# Patient Record
Sex: Male | Born: 1950 | Race: White | Hispanic: No | Marital: Single | Smoking: Current every day smoker
Health system: Southern US, Community
[De-identification: ages and names within clinical notes are randomized; demographics above are authoritative.]

## PROBLEM LIST (undated history)

## (undated) DIAGNOSIS — E785 Hyperlipidemia, unspecified: Secondary | ICD-10-CM

## (undated) DIAGNOSIS — S51802A Unspecified open wound of left forearm, initial encounter: Secondary | ICD-10-CM

## (undated) DIAGNOSIS — R519 Headache, unspecified: Secondary | ICD-10-CM

## (undated) DIAGNOSIS — I25709 Atherosclerosis of coronary artery bypass graft(s), unspecified, with unspecified angina pectoris: Secondary | ICD-10-CM

## (undated) DIAGNOSIS — I219 Acute myocardial infarction, unspecified: Secondary | ICD-10-CM

## (undated) DIAGNOSIS — Z972 Presence of dental prosthetic device (complete) (partial): Secondary | ICD-10-CM

## (undated) DIAGNOSIS — E1169 Type 2 diabetes mellitus with other specified complication: Secondary | ICD-10-CM

## (undated) DIAGNOSIS — I10 Essential (primary) hypertension: Secondary | ICD-10-CM

## (undated) DIAGNOSIS — Z720 Tobacco use: Secondary | ICD-10-CM

## (undated) DIAGNOSIS — E118 Type 2 diabetes mellitus with unspecified complications: Secondary | ICD-10-CM

## (undated) DIAGNOSIS — I2510 Atherosclerotic heart disease of native coronary artery without angina pectoris: Secondary | ICD-10-CM

## (undated) DIAGNOSIS — I639 Cerebral infarction, unspecified: Secondary | ICD-10-CM

## (undated) DIAGNOSIS — Z951 Presence of aortocoronary bypass graft: Secondary | ICD-10-CM

## (undated) HISTORY — PX: CORONARY ARTERY BYPASS GRAFT: SHX141

## (undated) HISTORY — PX: KNEE SURGERY: SHX244

## (undated) HISTORY — PX: TONSILLECTOMY: SUR1361

## (undated) HISTORY — PX: MUSCLE REPAIR: SHX867

## (undated) HISTORY — PX: MULTIPLE TOOTH EXTRACTIONS: SHX2053

---

## 2010-01-26 ENCOUNTER — Ambulatory Visit: Payer: Self-pay | Admitting: Thoracic Surgery (Cardiothoracic Vascular Surgery)

## 2010-02-03 ENCOUNTER — Ambulatory Visit: Payer: Self-pay | Admitting: Thoracic Surgery (Cardiothoracic Vascular Surgery)

## 2010-03-18 ENCOUNTER — Ambulatory Visit: Payer: Self-pay | Admitting: Cardiothoracic Surgery

## 2011-04-06 NOTE — Assessment & Plan Note (Signed)
[facility]   Matthew Moses, Matthew Moses  DOB:  1951-02-27                                        March 18, 2010  CHART #:  [id number]   Matthew Moses returns to the office today for postop followup visit  following coronary artery bypass grafting x4 with a sequential left  internal mammary to the mid and distal LAD and sequential saphenous vein  graft to the obtuse first and second obtuse marginal vessels done by Dr.  Dorris Fetch on March 15.  The patient has done well since discharge.  He  has a long history, including morbid obesity, tobacco abuse, poorly  controlled diabetes, hypertension, hyperlipidemia, gout, rheumatoid  arthritis.  Since discharge, he has been walking up to a mile several  times a day.  He has had no recurrent angina.  He had been smoking up to  2 packs of cigarettes a day prior to surgery.  He notes that since he  was discharged in March, he has smoked 4 cigarettes.   He was discharged home on:  1. Aspirin 81 mg a day.  2. Metformin 500 mg twice daily.  3. Zocor 40 mg a day.  4. Hydromorphone 2 mg as needed.  5. Lisinopril 5 mg twice a day.  6. Lorazepam 1 mg q.8 h. p.r.n.  7. Lopressor 25 p.o. b.i.d.  8. Amiodarone 400 p.o. t.i.d. for 7 days and then 400 twice a day for      1 week then 200 mg twice a day.   __________  The patient has called the office twice requesting sleeping  medication and further pain medication.  He was given Ultram although he  said he used some with relief, but it did make him nauseated.  He was  given a prescription for Ambien but said that did not work. Overall, as  he gets further away from surgery, he notes that his sleeping problems  have much improved and his pain no longer requires narcotics.   On exam, his lungs are clear bilaterally.  His sternum is stable and  healing well.  It has a small amount of eschar at the chest tube site,  but without infection.  The incision at his right knee is well  healed.  The left leg where the vein was harvested, after a failed attempt at  having a suitable vein in the right leg, is well healed.  He has no  peripheral edema.   Overall, the overall patient is making reasonable progress.  I have  encouraged him to enroll in the cardiac rehabilitation program.  He  lives near the hospital and can walk to the program.  Again, we have  discussed in detail the need to stop smoking and he has continued to  work on this.  He notes that previous attempts with medication,  including Chantix, caused side effects and he was not able to tolerated  it.  Overall, his wound is healing well.  I have not made him a return  appointment in the surgical office, but would be glad to see him at his  request or cardiology request at anytime.   Sheliah Plane, MD  Electronically Signed   EG/MEDQ  D:  03/18/2010  T:  03/18/2010  Job:  [id number]   cc:   Dr. Tereso Newcomer

## 2015-07-25 DIAGNOSIS — Z951 Presence of aortocoronary bypass graft: Secondary | ICD-10-CM | POA: Diagnosis present

## 2015-07-25 DIAGNOSIS — Z7982 Long term (current) use of aspirin: Secondary | ICD-10-CM | POA: Insufficient documentation

## 2015-07-25 DIAGNOSIS — I480 Paroxysmal atrial fibrillation: Secondary | ICD-10-CM | POA: Insufficient documentation

## 2015-07-25 DIAGNOSIS — I10 Essential (primary) hypertension: Secondary | ICD-10-CM | POA: Diagnosis present

## 2015-07-25 DIAGNOSIS — I2510 Atherosclerotic heart disease of native coronary artery without angina pectoris: Secondary | ICD-10-CM | POA: Insufficient documentation

## 2015-07-25 DIAGNOSIS — E7800 Pure hypercholesterolemia, unspecified: Secondary | ICD-10-CM | POA: Diagnosis present

## 2015-12-13 ENCOUNTER — Emergency Department (HOSPITAL_BASED_OUTPATIENT_CLINIC_OR_DEPARTMENT_OTHER)
Admission: EM | Admit: 2015-12-13 | Discharge: 2015-12-13 | Disposition: A | Discharge status: Home / Self Care | Payer: Medicaid Other | Attending: Emergency Medicine | Admitting: Emergency Medicine

## 2015-12-13 ENCOUNTER — Encounter (HOSPITAL_BASED_OUTPATIENT_CLINIC_OR_DEPARTMENT_OTHER): Payer: Self-pay | Admitting: *Deleted

## 2015-12-13 ENCOUNTER — Emergency Department (HOSPITAL_BASED_OUTPATIENT_CLINIC_OR_DEPARTMENT_OTHER): Payer: Medicaid Other

## 2015-12-13 DIAGNOSIS — Z79899 Other long term (current) drug therapy: Secondary | ICD-10-CM | POA: Diagnosis not present

## 2015-12-13 DIAGNOSIS — Y998 Other external cause status: Secondary | ICD-10-CM | POA: Insufficient documentation

## 2015-12-13 DIAGNOSIS — Z880 Allergy status to penicillin: Secondary | ICD-10-CM | POA: Diagnosis not present

## 2015-12-13 DIAGNOSIS — W540XXA Bitten by dog, initial encounter: Secondary | ICD-10-CM | POA: Insufficient documentation

## 2015-12-13 DIAGNOSIS — S61031A Puncture wound without foreign body of right thumb without damage to nail, initial encounter: Secondary | ICD-10-CM | POA: Insufficient documentation

## 2015-12-13 DIAGNOSIS — Y9389 Activity, other specified: Secondary | ICD-10-CM | POA: Insufficient documentation

## 2015-12-13 DIAGNOSIS — Z23 Encounter for immunization: Secondary | ICD-10-CM | POA: Insufficient documentation

## 2015-12-13 DIAGNOSIS — I10 Essential (primary) hypertension: Secondary | ICD-10-CM | POA: Diagnosis not present

## 2015-12-13 DIAGNOSIS — F17200 Nicotine dependence, unspecified, uncomplicated: Secondary | ICD-10-CM | POA: Insufficient documentation

## 2015-12-13 DIAGNOSIS — Z951 Presence of aortocoronary bypass graft: Secondary | ICD-10-CM | POA: Insufficient documentation

## 2015-12-13 DIAGNOSIS — S61051A Open bite of right thumb without damage to nail, initial encounter: Secondary | ICD-10-CM

## 2015-12-13 DIAGNOSIS — Y9289 Other specified places as the place of occurrence of the external cause: Secondary | ICD-10-CM | POA: Diagnosis not present

## 2015-12-13 DIAGNOSIS — I2510 Atherosclerotic heart disease of native coronary artery without angina pectoris: Secondary | ICD-10-CM | POA: Diagnosis not present

## 2015-12-13 HISTORY — DX: Presence of aortocoronary bypass graft: Z95.1

## 2015-12-13 HISTORY — DX: Essential (primary) hypertension: I10

## 2015-12-13 MED ORDER — HYDROCODONE-ACETAMINOPHEN 5-325 MG PO TABS
1.0000 | ORAL_TABLET | Freq: Once | ORAL | Status: AC
  Administered 2015-12-13: 1 via ORAL
  Filled 2015-12-13: qty 1

## 2015-12-13 MED ORDER — HYDROCODONE-ACETAMINOPHEN 5-325 MG PO TABS: 1.0000 | ORAL_TABLET | Freq: Four times a day (QID) | ORAL | Status: DC | PRN

## 2015-12-13 MED ORDER — CIPROFLOXACIN HCL 500 MG PO TABS
500.0000 mg | ORAL_TABLET | Freq: Once | ORAL | Status: AC
  Administered 2015-12-13: 500 mg via ORAL
  Filled 2015-12-13: qty 1

## 2015-12-13 MED ORDER — LIDOCAINE HCL (PF) 1 % IJ SOLN
10.0000 mL | Freq: Once | INTRAMUSCULAR | Status: AC
  Administered 2015-12-13: 10 mL via INTRADERMAL
  Filled 2015-12-13: qty 10

## 2015-12-13 MED ORDER — CIPROFLOXACIN HCL 500 MG PO TABS: 500.0000 mg | ORAL_TABLET | Freq: Two times a day (BID) | ORAL | Status: DC

## 2015-12-13 MED ORDER — CLINDAMYCIN HCL 150 MG PO CAPS: 450.0000 mg | ORAL_CAPSULE | Freq: Three times a day (TID) | ORAL | Status: DC

## 2015-12-13 MED ORDER — TETANUS-DIPHTH-ACELL PERTUSSIS 5-2.5-18.5 LF-MCG/0.5 IM SUSP: 0.5000 mL | Freq: Once | INTRAMUSCULAR | Status: DC

## 2015-12-13 MED ORDER — CLINDAMYCIN HCL 150 MG PO CAPS
450.0000 mg | ORAL_CAPSULE | Freq: Once | ORAL | Status: AC
  Administered 2015-12-13: 450 mg via ORAL
  Filled 2015-12-13: qty 3

## 2015-12-13 NOTE — ED Notes (Signed)
Pt d/c home w/3 Rx, pt ambulatory on d/c, driven home by spouse, pt a/o x4

## 2015-12-13 NOTE — ED Notes (Signed)
Pt reports dog bite to right thumb by friend's dog this morning. One puncture mark noted. No bleeding. Dog is UTD on rabies vaccine

## 2015-12-13 NOTE — ED Notes (Signed)
Pt refusing Tdap, states he had "a tetanus shot 3.5 years ago." EDPA Dahlia Client made aware

## 2015-12-13 NOTE — Discharge Instructions (Signed)
1. Medications: Vicodin, Cipro, Clinda, usual home medications 2. Treatment: rest, drink plenty of fluids, keep wound clean with warm soap and water, keep bandages dry 3. Follow Up: Please followup with your primary doctor in 2-3 days for discussion of your diagnoses and further evaluation after today's visit; if you do not have a primary care doctor use the resource guide provided to find one; Please return to the ER for her sitting symptoms, signs of worsening infection    Animal Bite Animal bites can range from mild to serious. An animal bite can result in a scratch on the skin, a deep open cut, a puncture of the skin, a crush injury, or tearing away of the skin or a body part. A small bite from a house pet will usually not cause serious problems. However, some animal bites can become infected or injure a bone or other tissue.  Bites from certain animals can be more dangerous because of the risk of spreading rabies, which is a serious viral infection. This risk is higher with bites from stray animals or wild animals, such as raccoons, foxes, skunks, and bats. Dogs are responsible for most animal bites. Children are bitten more often than adults. SYMPTOMS  Common symptoms of an animal bite include:   Pain.   Bleeding.   Swelling.   Bruising.  DIAGNOSIS  This condition may be diagnosed based on a physical exam and medical history. Your health care provider will examine the wound and ask for details about the animal and how the bite happened. You may also have tests, such as:   Blood tests to check for infection or to determine if surgery is needed.  X-rays to check for damage to bones or joints.  Culture test. This uses a sample of fluid from the wound to check for infection. TREATMENT  Treatment varies depending on the location and type of animal bite and your medical history. Treatment may include:   Wound care. This often includes cleaning the wound, flushing the wound with  saline solution, and applying a bandage (dressing). Sometimes, the wound is left open to heal because of the high risk of infection. However, in some cases, the wound may be closed with stitches (sutures), staples, skin glue, or adhesive strips.   Antibiotic medicine.   Tetanus shot.   Rabies treatment if the animal could have rabies.  In some cases, bites that have become infected may require IV antibiotics and surgical treatment in the hospital.  HOME CARE INSTRUCTIONS Wound Care  Follow instructions from your health care provider about how to take care of your wound. Make sure you:  Wash your hands with soap and water before you change your dressing. If soap and water are not available, use hand sanitizer.  Change your dressing as told by your health care provider.  Leave sutures, skin glue, or adhesive strips in place. These skin closures may need to be in place for 2 weeks or longer. If adhesive strip edges start to loosen and curl up, you may trim the loose edges. Do not remove adhesive strips completely unless your health care provider tells you to do that.  Check your wound every day for signs of infection. Watch for:   Increasing redness, swelling, or pain.   Fluid, blood, or pus.  General Instructions  Take or apply over-the-counter and prescription medicines only as told by your health care provider.   If you were prescribed an antibiotic, take or apply it as told by your health  care provider. Do not stop using the antibiotic even if your condition improves.   Keep the injured area raised (elevated) above the level of your heart while you are sitting or lying down, if this is possible.   If directed, apply ice to the injured area.   Put ice in a plastic bag.   Place a towel between your skin and the bag.   Leave the ice on for 20 minutes, 2-3 times per day.   Keep all follow-up visits as told by your health care provider. This is important.   SEEK MEDICAL CARE IF:  You have increasing redness, swelling, or pain at the site of your wound.   You have a general feeling of sickness (malaise).   You feel nauseous or you vomit.   You have pain that does not get better.  SEEK IMMEDIATE MEDICAL CARE IF:  You have a red streak extending away from your wound.   You have fluid, blood, or pus coming from your wound.   You have a fever or chills.   You have trouble moving your injured area.   You have numbness or tingling extending beyond the wound.   This information is not intended to replace advice given to you by your health care provider. Make sure you discuss any questions you have with your health care provider.   Document Released: 07/27/2011 Document Revised: 07/30/2015 Document Reviewed: 03/26/2015 Elsevier Interactive Patient Education [employer]

## 2015-12-13 NOTE — ED Provider Notes (Signed)
CSN: [id number]     Arrival date & time 12/13/15  1723 History   First MD Initiated Contact with Patient 12/13/15 1846     Chief Complaint  Patient presents with  . Animal Bite     (Consider location/radiation/quality/duration/timing/severity/associated sxs/prior Treatment) The history is provided by the patient and medical records. No language interpreter was used.     Matthew Moses is a 65 y.o. male  with a hx of HTN, CABG presents to the Emergency Department complaining of acute, persistent wound to the left thumb after he was bitten by his dog at 9 AM. Patient has had pain and increasing swelling and redness of the thumb since that time. He has not had any treatment prior to arrival. He rates his pain at a 10/10. He owns the dog and the dog is up-to-date on all of his vaccines.  He denies fever, chills, nausea, vomiting.  Unknown last tetanus shot.   Past Medical History  Diagnosis Date  . Hx of CABG   . Coronary artery disease   . Hypertension    Past Surgical History  Procedure Laterality Date  . Coronary artery bypass graft    . Knee surgery    . Tonsillectomy    . Muscle repair     No family history on file. Social History  Substance Use Topics  . Smoking status: Current Every Day Smoker  . Smokeless tobacco: Never Used  . Alcohol Use: No    Review of Systems  Constitutional: Negative for fever.  Gastrointestinal: Negative for nausea and vomiting.  Skin: Positive for wound.  Allergic/Immunologic: Negative for immunocompromised state.  Neurological: Negative for weakness and numbness.  Hematological: Does not bruise/bleed easily.  Psychiatric/Behavioral: The patient is not nervous/anxious.       Allergies  Penicillins  Home Medications   Prior to Admission medications   Medication Sig Start Date End Date Taking? Authorizing Provider  metFORMIN (GLUCOPHAGE) 500 MG tablet Take by mouth 2 (two) times daily with a meal.   Yes Historical Provider, MD   ciprofloxacin (CIPRO) 500 MG tablet Take 1 tablet (500 mg total) by mouth 2 (two) times daily. One po bid x 7 days 12/13/15   Dahlia Client Biance Moncrief, PA-C  clindamycin (CLEOCIN) 150 MG capsule Take 3 capsules (450 mg total) by mouth 3 (three) times daily. 12/13/15   Tashari Schoenfelder, PA-C  HYDROcodone-acetaminophen (NORCO/VICODIN) 5-325 MG tablet Take 1 tablet by mouth every 6 (six) hours as needed for moderate pain or severe pain. 12/13/15   Rayola Everhart, PA-C   BP 129/91 mmHg  Pulse 67  Temp(Src) 98.3 F (36.8 C) (Oral)  Resp 16  Ht 6\' 1"  (1.854 m)  Wt 108.863 kg  BMI 31.67 kg/m2  SpO2 98% Physical Exam  Constitutional: He is oriented to person, place, and time. He appears well-developed and well-nourished. No distress.  HENT:  Head: Normocephalic and atraumatic.  Eyes: Conjunctivae are normal. No scleral icterus.  Neck: Normal range of motion.  Cardiovascular: Normal rate, regular rhythm, normal heart sounds and intact distal pulses.   No murmur heard. Capillary refill < 3 sec  Pulmonary/Chest: Effort normal and breath sounds normal. No respiratory distress.  Musculoskeletal: Normal range of motion. He exhibits no edema.  ROM: Full range of motion of the right thumb with obvious puncture wound to the medial distal third; hemostasis achieved  Neurological: He is alert and oriented to person, place, and time.  Sensation: Intact to dull and sharp to the right thumb Strength: 5/5 with  flexion and extension in the right thumb  Skin: Skin is warm and dry. He is not diaphoretic.  Psychiatric: He has a normal mood and affect.  Nursing note and vitals reviewed.   ED Course  Irrigation and debridement Date/Time: 12/13/2015 7:49 PM Performed by: Dierdre Forth Authorized by: Dierdre Forth Consent: Verbal consent obtained. Risks and benefits: risks, benefits and alternatives were discussed Consent given by: patient Patient understanding: patient states understanding  of the procedure being performed Patient consent: the patient's understanding of the procedure matches consent given Procedure consent: procedure consent matches procedure scheduled Relevant documents: relevant documents present and verified Site marked: the operative site was marked Required items: required blood products, implants, devices, and special equipment available Patient identity confirmed: arm band and verbally with patient Time out: Immediately prior to procedure a "time out" was called to verify the correct patient, procedure, equipment, support staff and site/side marked as required. Preparation: Patient was prepped and draped in the usual sterile fashion. Local anesthesia used: yes Anesthesia: digital block Local anesthetic: lidocaine 1% without epinephrine Anesthetic total: 6 ml Patient sedated: no Patient tolerance: Patient tolerated the procedure well with no immediate complications   (including critical care time)  Imaging Review Dg Finger Thumb Right  12/13/2015  CLINICAL DATA:  Dog bite to right thumb this morning, with right thumb pain and swelling. Initial encounter. EXAM: RIGHT THUMB 2+V COMPARISON:  None. FINDINGS: The puncture wound is not well characterized, though there is suggestion of mild underlying soft tissue air. No radiopaque foreign body is seen. There is no evidence of osseous disruption. Visualized joint spaces are grossly preserved. IMPRESSION: No radiopaque foreign bodies seen. No evidence of osseous disruption. Electronically Signed   By: Roanna Raider M.D.   On: 12/13/2015 18:28   I have personally reviewed and evaluated these images and lab results as part of my medical decision-making.   MDM   Final diagnoses:  Dog bite of thumb, right, initial encounter   Matthew Moses presents with laceration from a dog bite.  Pt wounds irrigated well with 18ga angiocath with sterile saline.  Wounds examined with visualization of the base and no foreign  bodies seen.  Pt Alert and oriented, NAD, nontoxic, nonseptic appearing.  Capillary refill intact and pt without neurologic deficit.  x-rays with no obvious foreign body.  The dog is known and up to date on his shots, no need for rabies vaccine. Pain treated in the emergency department. Wounds not closed secondary to concern for infection. We'll discharge home with pain medication, Cipro, Clinda and requests for close follow-up with PCP or back in the ER.   Patient adamantly refusing his tetanus shot.  He states he's had one less than 5 years ago however initially he told me he did not know when his last update was. Risk and benefit discussed. Tetanus shot not given.    Dahlia Client Safina Huard, PA-C 12/13/15 2004  Blane Ohara, MD 12/14/15 [phone]

## 2016-04-29 ENCOUNTER — Emergency Department (HOSPITAL_BASED_OUTPATIENT_CLINIC_OR_DEPARTMENT_OTHER)
Admission: EM | Admit: 2016-04-29 | Discharge: 2016-04-29 | Disposition: A | Discharge status: Home / Self Care | Payer: Medicaid Other | Attending: Emergency Medicine | Admitting: Emergency Medicine

## 2016-04-29 ENCOUNTER — Emergency Department (HOSPITAL_BASED_OUTPATIENT_CLINIC_OR_DEPARTMENT_OTHER): Payer: Medicaid Other

## 2016-04-29 ENCOUNTER — Encounter (HOSPITAL_BASED_OUTPATIENT_CLINIC_OR_DEPARTMENT_OTHER): Payer: Self-pay

## 2016-04-29 DIAGNOSIS — F17200 Nicotine dependence, unspecified, uncomplicated: Secondary | ICD-10-CM | POA: Insufficient documentation

## 2016-04-29 DIAGNOSIS — Y929 Unspecified place or not applicable: Secondary | ICD-10-CM | POA: Diagnosis not present

## 2016-04-29 DIAGNOSIS — I2510 Atherosclerotic heart disease of native coronary artery without angina pectoris: Secondary | ICD-10-CM | POA: Diagnosis not present

## 2016-04-29 DIAGNOSIS — Y999 Unspecified external cause status: Secondary | ICD-10-CM | POA: Insufficient documentation

## 2016-04-29 DIAGNOSIS — Y9301 Activity, walking, marching and hiking: Secondary | ICD-10-CM | POA: Diagnosis not present

## 2016-04-29 DIAGNOSIS — I10 Essential (primary) hypertension: Secondary | ICD-10-CM | POA: Diagnosis not present

## 2016-04-29 DIAGNOSIS — S8002XA Contusion of left knee, initial encounter: Secondary | ICD-10-CM | POA: Insufficient documentation

## 2016-04-29 DIAGNOSIS — W1839XA Other fall on same level, initial encounter: Secondary | ICD-10-CM | POA: Diagnosis not present

## 2016-04-29 DIAGNOSIS — S8992XA Unspecified injury of left lower leg, initial encounter: Secondary | ICD-10-CM | POA: Diagnosis present

## 2016-04-29 MED ORDER — OXYCODONE-ACETAMINOPHEN 5-325 MG PO TABS
ORAL_TABLET | ORAL | Status: AC
  Filled 2016-04-29: qty 2

## 2016-04-29 MED ORDER — PROMETHAZINE HCL 25 MG PO TABS: 25.0000 mg | ORAL_TABLET | Freq: Four times a day (QID) | ORAL | Status: DC | PRN

## 2016-04-29 MED ORDER — OXYCODONE-ACETAMINOPHEN 5-325 MG PO TABS
2.0000 | ORAL_TABLET | Freq: Once | ORAL | Status: AC
  Administered 2016-04-29: 2 via ORAL

## 2016-04-29 MED ORDER — NAPROXEN 500 MG PO TABS: 500.0000 mg | ORAL_TABLET | Freq: Two times a day (BID) | ORAL | Status: DC

## 2016-04-29 NOTE — ED Notes (Signed)
MD at bedside. 

## 2016-04-29 NOTE — ED Notes (Signed)
Patient transported to X-ray 

## 2016-04-29 NOTE — ED Notes (Signed)
Pt states his left knee "buckled" and he fell today-walking with crutches-refused w/c and to sit in triage

## 2016-04-29 NOTE — ED Provider Notes (Signed)
CSN: [id number]     Arrival date & time 04/29/16  1949 History  By signing my name below, I, Matthew Moses, attest that this documentation has been prepared under the direction and in the presence of Rolland Porter, MD. Electronically Signed: [name], ED Scribe. 04/29/2016. 9:58 PM.   Chief Complaint  Patient presents with  . Knee Injury    The history is provided by the patient. No language interpreter was used.     HPI Comments: Matthew Moses is a 65 y.o. male who presents to the Emergency Department complaining of sudden onset, constant, moderate, left knee pain that radiates down his leg s/p mechanical fall that occurred a few hours PTA. Pt reports he was walking when he felt his knee "buckle" causing him to fall forward onto bilateral hands and left knee. Pt denies LOC, or head injury. He reports his pain is made worse with weight bearing and movement. Pt has hx of left knee orthoscopic surgery 30 years ago. No hx of gout. He denies any other injuries from fall.   Past Medical History  Diagnosis Date  . Hx of CABG   . Coronary artery disease   . Hypertension   . High cholesterol    Past Surgical History  Procedure Laterality Date  . Coronary artery bypass graft    . Knee surgery    . Tonsillectomy    . Muscle repair     No family history on file. Social History  Substance Use Topics  . Smoking status: Current Every Day Smoker  . Smokeless tobacco: Never Used  . Alcohol Use: No    Review of Systems  Constitutional: Negative for fever, chills, diaphoresis, appetite change and fatigue.  HENT: Negative for mouth sores, sore throat and trouble swallowing.   Eyes: Negative for visual disturbance.  Respiratory: Negative for cough, chest tightness, shortness of breath and wheezing.   Cardiovascular: Negative for chest pain.  Gastrointestinal: Negative for nausea, vomiting, abdominal pain, diarrhea and abdominal distention.  Endocrine: Negative for polydipsia, polyphagia and  polyuria.  Genitourinary: Negative for dysuria, frequency and hematuria.  Musculoskeletal: Positive for arthralgias ( left knee). Negative for gait problem.  Skin: Negative for color change, pallor and rash.  Neurological: Negative for dizziness, syncope, light-headedness and headaches.  Hematological: Does not bruise/bleed easily.  Psychiatric/Behavioral: Negative for behavioral problems and confusion.    Allergies  Penicillins  Home Medications   Prior to Admission medications   Medication Sig Start Date End Date Taking? Authorizing Provider  naproxen (NAPROSYN) 500 MG tablet Take 1 tablet (500 mg total) by mouth 2 (two) times daily. 04/29/16   Rolland Porter, MD  promethazine (PHENERGAN) 25 MG tablet Take 1 tablet (25 mg total) by mouth every 6 (six) hours as needed for nausea or vomiting. 04/29/16   Rolland Porter, MD   BP 150/87 mmHg  Pulse 101  Temp(Src) 98.6 F (37 C) (Oral)  Resp 20  Ht 6\' 1"  (1.854 m)  Wt 240 lb (108.863 kg)  BMI 31.67 kg/m2  SpO2 99% Physical Exam  Constitutional: He is oriented to person, place, and time. He appears well-developed and well-nourished. No distress.  HENT:  Head: Normocephalic.  Eyes: Conjunctivae are normal. Pupils are equal, round, and reactive to light. No scleral icterus.  Neck: Normal range of motion. Neck supple. No thyromegaly present.  Cardiovascular: Normal rate and regular rhythm.  Exam reveals no gallop and no friction rub.   No murmur heard. Pulmonary/Chest: Effort normal and breath sounds normal. No  respiratory distress. He has no wheezes. He has no rales.  Abdominal: Soft. Bowel sounds are normal. He exhibits no distension. There is no tenderness. There is no rebound.  Musculoskeletal: Normal range of motion.  Extensor mechanism intact  Neurological: He is alert and oriented to person, place, and time.  Skin: Skin is warm and dry. No rash noted.  Psychiatric: He has a normal mood and affect. His behavior is normal.  Nursing note  and vitals reviewed.   ED Course  Procedures (including critical care time) DIAGNOSTIC STUDIES: Oxygen Saturation is 99% on RA, normal by my interpretation.    COORDINATION OF CARE: 9:35 PM Discussed treatment plan with pt at bedside which includes Xray, Oxycodone and pt agreed to plan.   Imaging Review Dg Knee Complete 4 Views Left  04/29/2016  CLINICAL DATA:  Fall today with left knee pain, initial encounter EXAM: LEFT KNEE - COMPLETE 4+ VIEW COMPARISON:  None. FINDINGS: Mild degenerative changes are noted medially as well as within the patellofemoral space. No joint effusion is seen. No acute fracture or dislocation is noted. Mild osteopenia is seen. IMPRESSION: Degenerative change without acute abnormality. Electronically Signed   By: Alcide Clever M.D.   On: 04/29/2016 20:57   I have personally reviewed and evaluated these images as part of my medical decision-making.   MDM   Final diagnoses:  Knee contusion, left, initial encounter    I personally performed the services described in this documentation, which was scribed in my presence. The recorded information has been reviewed and is accurate.    Rolland Porter, MD 05/04/16 1235

## 2016-04-29 NOTE — Discharge Instructions (Signed)
Minimize your ambulation first 2-3 days. Ice and elevation. Slowly increase weightbearing as tolerated.  Contusion A contusion is a deep bruise. Contusions are the result of a blunt injury to tissues and muscle fibers under the skin. The injury causes bleeding under the skin. The skin overlying the contusion may turn blue, purple, or yellow. Minor injuries will give you a painless contusion, but more severe contusions may stay painful and swollen for a few weeks.  CAUSES  This condition is usually caused by a blow, trauma, or direct force to an area of the body. SYMPTOMS  Symptoms of this condition include:  Swelling of the injured area.  Pain and tenderness in the injured area.  Discoloration. The area may have redness and then turn blue, purple, or yellow. DIAGNOSIS  This condition is diagnosed based on a physical exam and medical history. An X-ray, CT scan, or MRI may be needed to determine if there are any associated injuries, such as broken bones (fractures). TREATMENT  Specific treatment for this condition depends on what area of the body was injured. In general, the best treatment for a contusion is resting, icing, applying pressure to (compression), and elevating the injured area. This is often called the RICE strategy. Over-the-counter anti-inflammatory medicines may also be recommended for pain control.  HOME CARE INSTRUCTIONS   Rest the injured area.  If directed, apply ice to the injured area:  Put ice in a plastic bag.  Place a towel between your skin and the bag.  Leave the ice on for 20 minutes, 2-3 times per day.  If directed, apply light compression to the injured area using an elastic bandage. Make sure the bandage is not wrapped too tightly. Remove and reapply the bandage as directed by your health care provider.  If possible, raise (elevate) the injured area above the level of your heart while you are sitting or lying down.  Take over-the-counter and  prescription medicines only as told by your health care provider. SEEK MEDICAL CARE IF:  Your symptoms do not improve after several days of treatment.  Your symptoms get worse.  You have difficulty moving the injured area. SEEK IMMEDIATE MEDICAL CARE IF:   You have severe pain.  You have numbness in a hand or foot.  Your hand or foot turns pale or cold.   This information is not intended to replace advice given to you by your health care provider. Make sure you discuss any questions you have with your health care provider.   Document Released: 08/18/2005 Document Revised: 07/30/2015 Document Reviewed: 03/26/2015 Elsevier Interactive Patient Education [employer]

## 2016-07-02 ENCOUNTER — Inpatient Hospital Stay (HOSPITAL_BASED_OUTPATIENT_CLINIC_OR_DEPARTMENT_OTHER)
Admission: EM | Admit: 2016-07-02 | Discharge: 2016-07-05 | DRG: 063 | Disposition: A | Discharge status: Home / Self Care | Payer: Medicaid Other | Attending: Neurology | Admitting: Neurology

## 2016-07-02 ENCOUNTER — Encounter (HOSPITAL_BASED_OUTPATIENT_CLINIC_OR_DEPARTMENT_OTHER): Payer: Self-pay | Admitting: Emergency Medicine

## 2016-07-02 ENCOUNTER — Emergency Department (HOSPITAL_BASED_OUTPATIENT_CLINIC_OR_DEPARTMENT_OTHER): Payer: Medicaid Other

## 2016-07-02 DIAGNOSIS — F17210 Nicotine dependence, cigarettes, uncomplicated: Secondary | ICD-10-CM | POA: Diagnosis present

## 2016-07-02 DIAGNOSIS — I63419 Cerebral infarction due to embolism of unspecified middle cerebral artery: Secondary | ICD-10-CM | POA: Diagnosis not present

## 2016-07-02 DIAGNOSIS — Z880 Allergy status to penicillin: Secondary | ICD-10-CM

## 2016-07-02 DIAGNOSIS — I10 Essential (primary) hypertension: Secondary | ICD-10-CM | POA: Diagnosis present

## 2016-07-02 DIAGNOSIS — I2510 Atherosclerotic heart disease of native coronary artery without angina pectoris: Secondary | ICD-10-CM | POA: Diagnosis present

## 2016-07-02 DIAGNOSIS — E7800 Pure hypercholesterolemia, unspecified: Secondary | ICD-10-CM | POA: Diagnosis present

## 2016-07-02 DIAGNOSIS — E669 Obesity, unspecified: Secondary | ICD-10-CM | POA: Diagnosis present

## 2016-07-02 DIAGNOSIS — Z951 Presence of aortocoronary bypass graft: Secondary | ICD-10-CM

## 2016-07-02 DIAGNOSIS — E1165 Type 2 diabetes mellitus with hyperglycemia: Secondary | ICD-10-CM | POA: Diagnosis present

## 2016-07-02 DIAGNOSIS — F1210 Cannabis abuse, uncomplicated: Secondary | ICD-10-CM | POA: Diagnosis present

## 2016-07-02 DIAGNOSIS — R471 Dysarthria and anarthria: Secondary | ICD-10-CM | POA: Diagnosis present

## 2016-07-02 DIAGNOSIS — E118 Type 2 diabetes mellitus with unspecified complications: Secondary | ICD-10-CM

## 2016-07-02 DIAGNOSIS — M069 Rheumatoid arthritis, unspecified: Secondary | ICD-10-CM | POA: Diagnosis present

## 2016-07-02 DIAGNOSIS — R29810 Facial weakness: Secondary | ICD-10-CM | POA: Diagnosis present

## 2016-07-02 DIAGNOSIS — Z6831 Body mass index (BMI) 31.0-31.9, adult: Secondary | ICD-10-CM

## 2016-07-02 DIAGNOSIS — Z720 Tobacco use: Secondary | ICD-10-CM | POA: Diagnosis not present

## 2016-07-02 DIAGNOSIS — R58 Hemorrhage, not elsewhere classified: Secondary | ICD-10-CM

## 2016-07-02 DIAGNOSIS — E785 Hyperlipidemia, unspecified: Secondary | ICD-10-CM | POA: Diagnosis present

## 2016-07-02 DIAGNOSIS — I639 Cerebral infarction, unspecified: Secondary | ICD-10-CM

## 2016-07-02 DIAGNOSIS — I6789 Other cerebrovascular disease: Secondary | ICD-10-CM | POA: Diagnosis not present

## 2016-07-02 DIAGNOSIS — I63312 Cerebral infarction due to thrombosis of left middle cerebral artery: Secondary | ICD-10-CM | POA: Diagnosis not present

## 2016-07-02 DIAGNOSIS — I6340 Cerebral infarction due to embolism of unspecified cerebral artery: Principal | ICD-10-CM | POA: Diagnosis present

## 2016-07-02 LAB — GLUCOSE, CAPILLARY: GLUCOSE-CAPILLARY: 286 mg/dL — AB (ref 65–99)

## 2016-07-02 LAB — COMPREHENSIVE METABOLIC PANEL
ALT: 14 U/L — AB (ref 17–63)
AST: 16 U/L (ref 15–41)
Albumin: 3.5 g/dL (ref 3.5–5.0)
Alkaline Phosphatase: 59 U/L (ref 38–126)
Anion gap: 8 (ref 5–15)
BUN: 11 mg/dL (ref 6–20)
CHLORIDE: 100 mmol/L — AB (ref 101–111)
CO2: 26 mmol/L (ref 22–32)
CREATININE: 0.76 mg/dL (ref 0.61–1.24)
Calcium: 8.5 mg/dL — ABNORMAL LOW (ref 8.9–10.3)
Glucose, Bld: 309 mg/dL — ABNORMAL HIGH (ref 65–99)
POTASSIUM: 3.7 mmol/L (ref 3.5–5.1)
Sodium: 134 mmol/L — ABNORMAL LOW (ref 135–145)
Total Bilirubin: 0.9 mg/dL (ref 0.3–1.2)
Total Protein: 6.7 g/dL (ref 6.5–8.1)

## 2016-07-02 LAB — CBC
HCT: 46.1 % (ref 39.0–52.0)
Hemoglobin: 16.3 g/dL (ref 13.0–17.0)
MCH: 30.6 pg (ref 26.0–34.0)
MCHC: 35.4 g/dL (ref 30.0–36.0)
MCV: 86.5 fL (ref 78.0–100.0)
PLATELETS: 196 10*3/uL (ref 150–400)
RBC: 5.33 MIL/uL (ref 4.22–5.81)
RDW: 12.9 % (ref 11.5–15.5)
WBC: 10.3 10*3/uL (ref 4.0–10.5)

## 2016-07-02 LAB — CBG MONITORING, ED: GLUCOSE-CAPILLARY: 315 mg/dL — AB (ref 65–99)

## 2016-07-02 LAB — URINE MICROSCOPIC-ADD ON
RBC / HPF: NONE SEEN RBC/hpf (ref 0–5)
SQUAMOUS EPITHELIAL / LPF: NONE SEEN

## 2016-07-02 LAB — URINALYSIS, ROUTINE W REFLEX MICROSCOPIC
Bilirubin Urine: NEGATIVE
HGB URINE DIPSTICK: NEGATIVE
KETONES UR: NEGATIVE mg/dL
Nitrite: NEGATIVE
PROTEIN: NEGATIVE mg/dL
Specific Gravity, Urine: 1.016 (ref 1.005–1.030)
pH: 7 (ref 5.0–8.0)

## 2016-07-02 LAB — DIFFERENTIAL
BASOS PCT: 1 %
Basophils Absolute: 0.1 10*3/uL (ref 0.0–0.1)
EOS ABS: 0.2 10*3/uL (ref 0.0–0.7)
EOS PCT: 2 %
Lymphocytes Relative: 33 %
Lymphs Abs: 3.4 10*3/uL (ref 0.7–4.0)
MONO ABS: 0.9 10*3/uL (ref 0.1–1.0)
Monocytes Relative: 9 %
Neutro Abs: 5.8 10*3/uL (ref 1.7–7.7)
Neutrophils Relative %: 55 %

## 2016-07-02 LAB — PROTIME-INR
INR: 0.99
PROTHROMBIN TIME: 13.1 s (ref 11.4–15.2)

## 2016-07-02 LAB — OCCULT BLOOD X 1 CARD TO LAB, STOOL: Fecal Occult Bld: NEGATIVE

## 2016-07-02 LAB — RAPID URINE DRUG SCREEN, HOSP PERFORMED
Amphetamines: NOT DETECTED
Barbiturates: NOT DETECTED
Benzodiazepines: NOT DETECTED
Cocaine: NOT DETECTED
OPIATES: NOT DETECTED
Tetrahydrocannabinol: POSITIVE — AB

## 2016-07-02 LAB — ETHANOL

## 2016-07-02 LAB — APTT: aPTT: 27 seconds (ref 24–36)

## 2016-07-02 LAB — MRSA PCR SCREENING: MRSA by PCR: NEGATIVE

## 2016-07-02 MED ORDER — ACETAMINOPHEN 325 MG PO TABS: 650.0000 mg | ORAL_TABLET | ORAL | Status: DC | PRN

## 2016-07-02 MED ORDER — SODIUM CHLORIDE 0.9 % IV SOLN
INTRAVENOUS | Status: DC
  Administered 2016-07-02 – 2016-07-03 (×3): via INTRAVENOUS

## 2016-07-02 MED ORDER — STROKE: EARLY STAGES OF RECOVERY BOOK
Freq: Once | Status: AC
  Administered 2016-07-02: 16:00:00
  Filled 2016-07-02: qty 1

## 2016-07-02 MED ORDER — ALTEPLASE (STROKE) FULL DOSE INFUSION
90.0000 mg | Freq: Once | INTRAVENOUS | Status: AC
  Administered 2016-07-02: 90 mg via INTRAVENOUS
  Filled 2016-07-02: qty 1
  Filled 2016-07-02: qty 100

## 2016-07-02 MED ORDER — ACETAMINOPHEN 650 MG RE SUPP: 650.0000 mg | RECTAL | Status: DC | PRN

## 2016-07-02 MED ORDER — INSULIN ASPART 100 UNIT/ML ~~LOC~~ SOLN: 0.0000 [IU] | SUBCUTANEOUS | Status: DC

## 2016-07-02 MED ORDER — SODIUM CHLORIDE 0.9 % IV SOLN: 50.0000 mL | Freq: Once | INTRAVENOUS | Status: DC

## 2016-07-02 MED ORDER — INSULIN ASPART 100 UNIT/ML ~~LOC~~ SOLN
0.0000 [IU] | Freq: Three times a day (TID) | SUBCUTANEOUS | Status: DC
  Administered 2016-07-03: 7 [IU] via SUBCUTANEOUS
  Administered 2016-07-03: 4 [IU] via SUBCUTANEOUS
  Administered 2016-07-03: 11 [IU] via SUBCUTANEOUS
  Administered 2016-07-04 (×2): 7 [IU] via SUBCUTANEOUS
  Administered 2016-07-04: 11 [IU] via SUBCUTANEOUS
  Administered 2016-07-05: 7 [IU] via SUBCUTANEOUS
  Administered 2016-07-05: 11 [IU] via SUBCUTANEOUS

## 2016-07-02 MED ORDER — FAMOTIDINE IN NACL 20-0.9 MG/50ML-% IV SOLN
20.0000 mg | Freq: Two times a day (BID) | INTRAVENOUS | Status: DC
  Administered 2016-07-03: 20 mg via INTRAVENOUS
  Filled 2016-07-02 (×2): qty 50

## 2016-07-02 MED ORDER — INSULIN ASPART 100 UNIT/ML ~~LOC~~ SOLN: 0.0000 [IU] | Freq: Every day | SUBCUTANEOUS | Status: DC

## 2016-07-02 MED ORDER — SENNOSIDES-DOCUSATE SODIUM 8.6-50 MG PO TABS: 1.0000 | ORAL_TABLET | Freq: Every evening | ORAL | Status: DC | PRN

## 2016-07-02 NOTE — ED Triage Notes (Signed)
Pt having right sided mouth droop, dragging his right leg and right arm flaccid.  Slurred speech.   Last known normal 1130 am.

## 2016-07-02 NOTE — ED Provider Notes (Signed)
Emergency Department Provider Note   I have reviewed the triage vital signs and the nursing notes.   HISTORY  Chief Complaint Code Stroke   HPI Matthew Moses is a 65 y.o. male with PMH of HTN, HLD, CAD, and DM presents to the emergency department for evaluation of sudden onset dysarthria and facial droop. The patient reports that he was last known well at 11:30 AM today. He believes that symptoms began at approximately 12:15 but cannot recall exactly when he was last well other than 11:30 AM. Denies prior history of stroke. Does not feel as if deficits are getting worse but not improving either. Patient with no chest pain or difficulty breathing. No head trauma. No headache. Patient denies any history of intracranial hemorrhage, cancer, intracranial mass, or history of GI bleeding.   Past Medical History:  Diagnosis Date  . Coronary artery disease   . High cholesterol   . Hx of CABG   . Hypertension     Patient Active Problem List   Diagnosis Date Noted  . CVA (cerebral infarction) 07/02/2016    Past Surgical History:  Procedure Laterality Date  . CORONARY ARTERY BYPASS GRAFT    . KNEE SURGERY    . MUSCLE REPAIR    . TONSILLECTOMY        Allergies Penicillins  No family history on file.  Social History Social History  Substance Use Topics  . Smoking status: Current Every Day Smoker  . Smokeless tobacco: Never Used  . Alcohol use No    Review of Systems  Constitutional: No fever/chills Eyes: No visual changes. ENT: No sore throat. Cardiovascular: Denies chest pain. Respiratory: Denies shortness of breath. Gastrointestinal: No abdominal pain.  No nausea, no vomiting.  No diarrhea.  No constipation. Genitourinary: Negative for dysuria. Musculoskeletal: Negative for back pain. Skin: Negative for rash. Neurological: Negative for headaches. Significant dysarthria and right face droop. Reports difficulty walking.   10-point ROS otherwise  negative.  ____________________________________________   PHYSICAL EXAM:  VITAL SIGNS: BP: 134/86 HR: 70 SpO2: 98% RR: 17   Constitutional: Alert and oriented. Clear dysarthria and right facial asymmetry.  Eyes: Conjunctivae are normal. PERRL. EOMI. Head: Atraumatic. Nose: No congestion/rhinnorhea. Mouth/Throat: Mucous membranes are moist.  Oropharynx non-erythematous. Neck: No stridor.  Cardiovascular: Normal rate, regular rhythm. Good peripheral circulation. Grossly normal heart sounds.   Respiratory: Normal respiratory effort.  No retractions. Lungs CTAB. Gastrointestinal: Soft and nontender. No distention.  Musculoskeletal: No lower extremity tenderness nor edema. No gross deformities of extremities. Neurologic: Dysarthria noted with partial right facial palsy. Positive RUE pronator drift. No clear sensory deficits or visual field deficits. No LE weakness or numbness.  Skin:  Skin is warm, dry and intact. No rash noted. Psychiatric: Mood and affect are normal. Speech and behavior are normal.  ____________________________________________   LABS (all labs ordered are listed, but only abnormal results are displayed)  Labs Reviewed  COMPREHENSIVE METABOLIC PANEL - Abnormal; Notable for the following:       Result Value   Sodium 134 (*)    Chloride 100 (*)    Glucose, Bld 309 (*)    Calcium 8.5 (*)    ALT 14 (*)    All other components within normal limits  CBG MONITORING, ED - Abnormal; Notable for the following:    Glucose-Capillary 315 (*)    All other components within normal limits  CBC  DIFFERENTIAL  OCCULT BLOOD X 1 CARD TO LAB, STOOL  ETHANOL  PROTIME-INR  APTT  URINE RAPID DRUG SCREEN, HOSP PERFORMED  URINALYSIS, ROUTINE W REFLEX MICROSCOPIC (NOT AT [facility])  POC OCCULT BLOOD, ED   ____________________________________________  EKG  Sinus rhythm. No STEMI. Reviewed in MUSE.  ____________________________________________  RADIOLOGY  Ct Head Code  Stroke W/o Cm  Result Date: 07/02/2016 CLINICAL DATA:  Code stroke. Right-sided weakness. Slurred speech. Last normal 11:30 a.m. EXAM: CT HEAD WITHOUT CONTRAST TECHNIQUE: Contiguous axial images were obtained from the base of the skull through the vertex without intravenous contrast. COMPARISON:  MRI head 11/11/2014 FINDINGS: Ventricle size normal.  Mild atrophy, appropriate for age. Negative for acute infarct. Negative for hemorrhage or mass. No edema or shift of the midline structures. 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 Negative calvarium. ASPECTS [facility] Stroke Program Early CT Score, [email or web address]) - Ganglionic level infarction (caudate, lentiform nuclei, internal capsule, insula, M1-M3 cortex): 7 - Supraganglionic infarction (M4-M6 cortex): 3 Total score (0-10 with 10 being normal): 10 IMPRESSION: 1. No acute intracranial abnormality. 2. ASPECTS score 10 These results were called by telephone at the time of interpretation on 07/02/2016 at 2:37 pm to Dr. Alona Bene , who verbally acknowledged these results. Electronically Signed   By: Marlan Palau M.D.   On: 07/02/2016 14:37    ____________________________________________   PROCEDURES  Procedure(s) performed:   Procedures  CRITICAL CARE Performed by: Maia Plan Total critical care time: 60 minutes Critical care time was exclusive of separately billable procedures and treating other patients. Critical care was necessary to treat or prevent imminent or life-threatening deterioration. Critical care was time spent personally by me on the following activities: development of treatment plan with patient and/or surrogate as well as nursing, discussions with consultants, evaluation of patient's response to treatment, examination of patient, obtaining history from patient or surrogate, ordering and performing treatments and interventions, ordering and review of laboratory studies, ordering and review of radiographic  studies, pulse oximetry and re-evaluation of patient's condition.  Alona Bene, MD Emergency Medicine  ____________________________________________   INITIAL IMPRESSION / ASSESSMENT AND PLAN / ED COURSE  Pertinent labs & imaging results that were available during my care of the patient were reviewed by me and considered in my medical decision making (see chart for details).  Patient presents to the emergency department for evaluation of strokelike symptoms. Patient outside of the 3 hour TPA window but within the 4.5 hour TPA window. Blood pressure is controlled without additional medication. Patient with no absolute contraindications to TPA. NIHSS 4 with above described deficits. Activated code stroke and will discuss with neurology regarding patient's candidacy for tPA.   2:15 PM Discussed case with Dr. Hilda Blades from Neurology. Reviewed the patient's physical exam and associated risk factors. The patient is beyond the 3 hour dose for TPA. He is within the 4.5 hour window but has relative contraindications including history of diabetes and relatively low NIH stroke scale. Will discuss the risk of bleeding with patient but Neurology recommendation is to consider not giving for mild symptoms and relative contraindications beyond the 3 hour window.   02:30 PM Discussed the risks and benefits of TPA in detail with the patient. He states he's been thinking about his onset of symptoms and believes that he was actually last known well at 12:05 PM as opposed to 11:30. I advised that given his diabetes he has some relative contraindications to TPA as I discussed with Dr. Hilda Blades. I presented the alternative of PT/OT/speech therapy with embolic workup. We also discussed that some strokes resolve spontaneously but it's impossible  to know if his will. Patient would like to start the TPA knowing that he has a risk of serious potentially fatal bleeding which we discussed in detail. Paged Neurology to update.    Discussed patient's case with Neurology, Dr. Hilda Blades.  Recommend admission to inpatient, ICU bed.  I will place holding orders per their request. Patient and family (if present) updated with plan. Care transferred to Neurology service.  I reviewed all nursing notes, vitals, pertinent old records, EKGs, labs, imaging (as available).  ____________________________________________  FINAL CLINICAL IMPRESSION(S) / ED DIAGNOSES  Final diagnoses:  Cerebral infarction due to unspecified mechanism     MEDICATIONS GIVEN DURING THIS VISIT:  Medications  alteplase (ACTIVASE) 1 mg/mL infusion 90 mg (90 mg Intravenous Transfusing/Transfer 07/02/16 1508)    Followed by  0.9 %  sodium chloride infusion (not administered)     NEW OUTPATIENT MEDICATIONS STARTED DURING THIS VISIT:  None   Note:  This document was prepared using Dragon voice recognition software and may include unintentional dictation errors.  Alona Bene, MD Emergency Medicine   Maia Plan, MD 07/02/16 1600

## 2016-07-02 NOTE — H&P (Signed)
Initial Neurological Consultation                      NEURO HOSPITALIST ADMISSION NOTE     Reason for Admission:  Dysarthria, right facial droop, right pronator drift, status post administration of TPA.   HPI:                                                                                                                                          Matthew Moses is a pleasant 65 year old gentleman with a past medical history of hypertension, hyperlipidemia, coronary artery disease, and diabetes. He presented to an outside emergency room with a complaint of dysarthria. He was noted to have a significant right facial droop as well as a right pronator drift. Matthew Moses has been somewhat inconsistent in his description of the onset of his symptoms. Initially he stated they were 11:30, however he then corrected himself to 12:15, at present he feels quite certain the onset was 1:30. He was evaluated by Dr. Ivin Booty long who felt that he was likely within the 3 hour TPA window and certainly within the 4.5 hour TPA window.  In a thorough discussion with Dr. Jacqulyn Bath, all risks factors for TPA were discussed in detail. The fact that the symptoms were relatively mild was considered a relative contraindication to TPA. However the symptoms are quite disturbing to Matthew Moses and on further discussion Matthew Moses requested that TPA be administered. The risk and benefits of TPA were discussed in detail. Matthew Moses was subsequently transferred here to [facility] ICU for further observation and care.  Matthew Moses reports that he does not like taking medication and has not actually been taking any medication for his various medical conditions. He subsequently blamed this on a problem with a rehabilitation facility that he is staying at. He reports he had fallen several years ago and injured his right arm and leg. He notes that since that time he has been institutionalized in the institution he states that will not provide him with his medications. We  will be contacting social services to see if they can assist him. [name] reports smoking on a daily basis. He reports no prior history of stroke.  Past Medical History:  Diagnosis Date  . Coronary artery disease   . High cholesterol   . Hx of CABG   . Hypertension     Past Surgical History:  Procedure Laterality Date  . CORONARY ARTERY BYPASS GRAFT    . KNEE SURGERY    . MUSCLE REPAIR    . TONSILLECTOMY      MEDICATIONS:  I have reviewed the patient's current medications.  Allergies  Allergen Reactions  . Penicillins Itching and Rash     Social History:  reports that he has been smoking.  He has never used smokeless tobacco. He reports that he uses drugs, including Marijuana. He reports that he does not drink alcohol.  No family history on file.   ROS:                                                                                                                                       History obtained from chart review  General ROS: negative for - chills, fatigue, fever, night sweats, weight gain or weight loss Psychological ROS: negative for - behavioral disorder, hallucinations, memory difficulties, mood swings or suicidal ideation Ophthalmic ROS: negative for - blurry vision, double vision, eye pain or loss of vision ENT ROS: negative for - epistaxis, nasal discharge, oral lesions, sore throat, tinnitus or vertigo Allergy and Immunology ROS: negative for - hives or itchy/watery eyes Hematological and Lymphatic ROS: negative for - bleeding problems, bruising or swollen lymph nodes Endocrine ROS: negative for - galactorrhea, hair pattern changes, polydipsia/polyuria or temperature intolerance Respiratory ROS: negative for - cough, hemoptysis, shortness of breath or wheezing Cardiovascular ROS: negative for - chest pain, dyspnea on exertion, edema or irregular  heartbeat Gastrointestinal ROS: negative for - abdominal pain, diarrhea, hematemesis, nausea/vomiting or stool incontinence Genito-Urinary ROS: negative for - dysuria, hematuria, incontinence or urinary frequency/urgency Musculoskeletal ROS: negative for - joint swelling or muscular weakness Neurological ROS: as noted in HPI Dermatological ROS: negative for rash and skin lesion changes   General Exam                                                                                                      Blood pressure 132/74, pulse 62, resp. rate 18, height 6\' 1"  (1.854 m), weight 109.6 kg (241 lb 10 oz), SpO2 98 %. HEENT-  Normocephalic, no lesions, without obvious abnormality.  Normal external eye and conjunctiva.  Normal TM's bilaterally.  Normal auditory canals and external ears. Normal external nose, mucus membranes and septum.  Normal pharynx. Cardiovascular- regular rate and rhythm, S1, S2 normal, no murmur, click, rub or gallop, pulses palpable throughout   Lungs- chest clear, no wheezing, rales, normal symmetric air entry, Heart exam - S1, S2 normal, no murmur, no gallop, rate regular Abdomen- soft, non-tender; bowel sounds normal; no masses,  no organomegaly Extremities- less then 2 second capillary refill Lymph-no  adenopathy palpable Musculoskeletal-no joint tenderness, deformity or swelling Skin-warm and dry, no hyperpigmentation, vitiligo, or suspicious lesions  Neurological Examination Mental Status: Matthew Moses is alert and oriented but dysarthric. He answers questions and follows commands appropriately. Cranial Nerves: Pupils are equal round reactive to light and accommodation. Extraocular movements are intact. There is a significant right facial droop. Tongue is midline. Motor: Subtle right pronator drift is noted Sensory: Decreased distally in the lower extremities to pinprick and light touch. Deep Tendon Reflexes: 1-2+ and symmetric throughout Plantars: Right:  downgoing   Left: downgoing Cerebellar: normal finger-to-nose, normal rapid alternating movements and normal heel-to-shin test    Lab Results: Basic Metabolic Panel:  Recent Labs Lab 07/02/16 1430  NA 134*  K 3.7  CL 100*  CO2 26  GLUCOSE 309*  BUN 11  CREATININE 0.76  CALCIUM 8.5*    Liver Function Tests:  Recent Labs Lab 07/02/16 1430  AST 16  ALT 14*  ALKPHOS 59  BILITOT 0.9  PROT 6.7  ALBUMIN 3.5   No results for input(s): LIPASE, AMYLASE in the last 168 hours. No results for input(s): AMMONIA in the last 168 hours.  CBC:  Recent Labs Lab 07/02/16 1405  WBC 10.3  NEUTROABS 5.8  HGB 16.3  HCT 46.1  MCV 86.5  PLT 196    Cardiac Enzymes: No results for input(s): CKTOTAL, CKMB, CKMBINDEX, TROPONINI in the last 168 hours.  Lipid Panel: No results for input(s): CHOL, TRIG, HDL, CHOLHDL, VLDL, LDLCALC in the last 168 hours.  CBG:  Recent Labs Lab 07/02/16 1437  GLUCAP 315*    Microbiology: No results found for this or any previous visit.  Coagulation Studies:  Recent Labs  07/02/16 1430  LABPROT 13.1  INR 0.99    Imaging: Ct Head Code Stroke W/o Cm  Result Date: 07/02/2016 CLINICAL DATA:  Code stroke. Right-sided weakness. Slurred speech. Last normal 11:30 a.m. EXAM: CT HEAD WITHOUT CONTRAST TECHNIQUE: Contiguous axial images were obtained from the base of the skull through the vertex without intravenous contrast. COMPARISON:  MRI head 11/11/2014 FINDINGS: Ventricle size normal.  Mild atrophy, appropriate for age. Negative for acute infarct. Negative for hemorrhage or mass. No edema or shift of the midline structures. 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 Negative calvarium. ASPECTS [facility] Stroke Program Early CT Score, [email or web address]) - Ganglionic level infarction (caudate, lentiform nuclei, internal capsule, insula, M1-M3 cortex): 7 - Supraganglionic infarction (M4-M6 cortex): 3 Total score (0-10 with 10 being  normal): 10 IMPRESSION: 1. No acute intracranial abnormality. 2. ASPECTS score 10 These results were called by telephone at the time of interpretation on 07/02/2016 at 2:37 pm to Dr. Alona Bene , who verbally acknowledged these results. Electronically Signed   By: Marlan Palau M.D.   On: 07/02/2016 14:37    Assessment/Plan:  Furious is a pleasant 65 year old gentleman who presents with dysarthria, right facial droop, and mild right pronator drift. His presentation is consistent with an ischemic event likely in the distribution of the left middle cerebral artery. He was within the time window for TPA and that has been administered. Given that his symptoms are mild, he would not be a good candidate for interventional radiology at this point.  Plan:  1. Admit to ICU  2. Standard ICU care  3. HgbA1c, fasting lipid panel  4. MRI, MRA  of the brain without contrast  5. PT consult, OT consult, Speech consult  6. Echocardiogram  7. Carotid dopplers  8. Prophylactic therapy  9. Risk factor modification  10. Telemetry monitoring  11. Frequent neuro checks  12. NPO until passes stroke swallow screen  13. please page stroke NP  Or  PA  Or MD from 8am -4 pm  as this patient from this time will be  followed by the stroke.   You can look them up on [email or web address]  Password [email or web address]      Tsering Leaman A. Hilda Blades, M.D. Neurohospitalist Phone: [phone]  07/02/2016, 4:34 PM

## 2016-07-02 NOTE — Progress Notes (Signed)
Pt requesting to sit on the side of bed. I told him that he is on bedrest for 24hrs due to the TPA medication and safety risks of falls and death. He requested an AMA form. I spoke with Dr Hilda Blades stating pts request to sit on side of bed and he said due to risk of injury he is not allowed and we would not be able to keep the pt here against his will and AMA paperwork could be given with pt knowledge of likely death if leaving. Pt given the risks per Dr Hilda Blades. He stated he still wanted to leave but has no clothes or ride so he will stay at this point. Will continue to monitor. Bed alarm on patient due to high risk of fall and wanting to leave AMA.

## 2016-07-02 NOTE — ED Notes (Signed)
Awaiting result from stool card to initiate alteplase.

## 2016-07-03 ENCOUNTER — Inpatient Hospital Stay (HOSPITAL_COMMUNITY): Payer: Medicaid Other

## 2016-07-03 LAB — GLUCOSE, CAPILLARY
GLUCOSE-CAPILLARY: 279 mg/dL — AB (ref 65–99)
GLUCOSE-CAPILLARY: 229 mg/dL — AB (ref 65–99)
GLUCOSE-CAPILLARY: 164 mg/dL — AB (ref 65–99)
GLUCOSE-CAPILLARY: 164 mg/dL — AB (ref 65–99)

## 2016-07-03 LAB — LIPID PANEL
Cholesterol: 159 mg/dL (ref 0–200)
HDL: 24 mg/dL — ABNORMAL LOW (ref >40)
LDL CALC: 98 mg/dL (ref 0–99)
Total CHOL/HDL Ratio: 6.6 RATIO
Triglycerides: 183 mg/dL — ABNORMAL HIGH (ref <150)
VLDL: 37 mg/dL (ref 0–40)

## 2016-07-03 MED ORDER — ASPIRIN 81 MG PO CHEW
81.0000 mg | CHEWABLE_TABLET | Freq: Every day | ORAL | Status: DC
  Administered 2016-07-03 – 2016-07-04 (×2): 81 mg via ORAL
  Filled 2016-07-03 (×2): qty 1

## 2016-07-03 MED ORDER — TRAMADOL HCL 50 MG PO TABS
50.0000 mg | ORAL_TABLET | Freq: Four times a day (QID) | ORAL | Status: DC | PRN
  Administered 2016-07-03 – 2016-07-04 (×3): 50 mg via ORAL
  Filled 2016-07-03 (×3): qty 1

## 2016-07-03 NOTE — Progress Notes (Signed)
PT Cancellation Note  Patient Details Name: Argelio Granier MRN: [id number] DOB: 07-Jun-1951   Cancelled Treatment:    Reason Eval/Treat Not Completed: Medical issues which prohibited therapy (Bedrest until 1607.  Will return tomorrow am. Thanks. )   Jonice Cerra F 07/03/2016, 11:42 AM  [facility] [phone] (pager)

## 2016-07-03 NOTE — Progress Notes (Signed)
OT Cancellation Note  Patient Details Name: Matthew Moses MRN: [id number] DOB: 09-30-1951   Cancelled Treatment:    Reason Eval/Treat Not Completed: Patient not medically ready (active bedrest orders). Will follow up for OT eval with updated activity orders.  Gaye Alken M.S., OTR/L Pager: [phone]  07/03/2016, 2:38 PM

## 2016-07-03 NOTE — Progress Notes (Signed)
STROKE TEAM PROGRESS NOTE   HISTORY OF PRESENT ILLNESS (per record) Matthew Moses is a pleasant 65 year old gentleman with a past medical history of hypertension, hyperlipidemia, coronary artery disease, and diabetes. He presented to an outside emergency room with a complaint of dysarthria. He was noted to have a significant right facial droop as well as a right pronator drift. Matthew Moses has been somewhat inconsistent in his description of the onset of his symptoms. Initially he stated they were 11:30, however he then corrected himself to 12:15, at present he feels quite certain the onset was 1:30. He was evaluated by Dr. Ivin Booty long who felt that he was likely within the 3 hour TPA window and certainly within the 4.5 hour TPA window.  In a thorough discussion with Dr. Jacqulyn Bath, all risks factors for TPA were discussed in detail. The fact that the symptoms were relatively mild was considered a relative contraindication to TPA. However the symptoms are quite disturbing to Matthew Moses and on further discussion Matthew Moses requested that TPA be administered. The risk and benefits of TPA were discussed in detail. Matthew Moses was subsequently transferred here to [facility] ICU for further observation and care.  Matthew Moses reports that he does not like taking medication and has not actually been taking any medication for his various medical conditions. He subsequently blamed this on a problem with a rehabilitation facility that he is staying at. He reports he had fallen several years ago and injured his right arm and leg. He notes that since that time he has been institutionalized in the institution he states that will not provide him with his medications. We will be contacting social services to see if they can assist him. Matthew Moses reports smoking on a daily basis. He reports no prior history of stroke.   SUBJECTIVE (INTERVAL HISTORY) Feels well.  No headaches.  Some slurred speech.  He is a smoker.  No cravings yet.    CT repeat at 24 hours,  MRI, MRA, CDUS, TTE pending.   OBJECTIVE Temp:  [97.4 F (36.3 C)-98.4 F (36.9 C)] 97.4 F (36.3 C) (08/12 0400) Pulse Rate:  [52-82] 54 (08/12 0700) Cardiac Rhythm: Sinus bradycardia (08/12 0000) Resp:  [0-34] 20 (08/12 0700) BP: (97-163)/(46-87) 103/52 (08/12 0700) SpO2:  [93 %-100 %] 95 % (08/12 0700) Weight:  [109.6 kg (241 lb 10 oz)] 109.6 kg (241 lb 10 oz) (08/11 1600)  CBC:  Recent Labs Lab 07/02/16 1405  WBC 10.3  NEUTROABS 5.8  HGB 16.3  HCT 46.1  MCV 86.5  PLT 196    Basic Metabolic Panel:  Recent Labs Lab 07/02/16 1430  NA 134*  K 3.7  CL 100*  CO2 26  GLUCOSE 309*  BUN 11  CREATININE 0.76  CALCIUM 8.5*    Lipid Panel:    Component Value Date/Time   CHOL 159 07/03/2016 0424   TRIG 183 (H) 07/03/2016 0424   HDL 24 (L) 07/03/2016 0424   CHOLHDL 6.6 07/03/2016 0424   VLDL 37 07/03/2016 0424   LDLCALC 98 07/03/2016 0424   HgbA1c: No results found for: HGBA1C Urine Drug Screen:    Component Value Date/Time   LABOPIA NONE DETECTED 07/02/2016 1646   COCAINSCRNUR NONE DETECTED 07/02/2016 1646   LABBENZ NONE DETECTED 07/02/2016 1646   AMPHETMU NONE DETECTED 07/02/2016 1646   THCU POSITIVE (A) 07/02/2016 1646   LABBARB NONE DETECTED 07/02/2016 1646      IMAGING  Ct Head Code Stroke W/o Cm 07/02/2016 1. No acute intracranial abnormality.  2. ASPECTS score 10  MRI / MRA Head / Brain - Pending        PHYSICAL EXAM  Awake, alert, fully oriented. Language-fluent.  Comprehension, naming, repetition- intact. Dysarthria.  Significant right lower facial droop.  EOMI. PERL. Strength 5/5 bilaterally.  No pronator drift. Coordination, sensory - intact Gait- deferred.    ASSESSMENT/PLAN Matthew Moses is a 65 y.o. male with history of hypertension, coronary artery bypass graft surgery, hyperlipidemia, and coronary artery disease presenting with right facial droop and right pronator drift.  He received IV TPA on Friday, 07/02/2016  at 1545.  Stroke:  Dominant infarct embolic secondary to an unknown source. MRI/MRA pending.  Resultant  - right facial droop  MRI  pending  MRA  pending  Carotid Doppler  pending  2D Echo  pending  LDL - 98  HgbA1c pending  VTE prophylaxis - SCDs  Diet heart healthy/carb modified Room service appropriate? Yes; Fluid consistency: Thin  No antithrombotic prior to admission, now on No antithrombotic - secondary to TPA therapy.  Patient counseled to be compliant with his antithrombotic medications  Ongoing aggressive stroke risk factor management  Therapy recommendations:  Pending  Disposition:  Pending  Hypertension  Stable - although mildly low blood pressures  Permissive hypertension (OK if < 220/120) but gradually normalize in 5-7 days  Long-term BP goal normotensive  Hyperlipidemia  Home meds: No lipid lowering medications prior to admission  LDL 98, goal < 70  Add Lipitor 20 mg daily  Continue statin at discharge    Other Stroke Risk Factors  Advanced age  Cigarette smoker - advised to stop smoking  Obesity, Body mass index is 31.88 kg/m., recommend weight loss, diet and exercise as appropriate   UDS positive for [facility]  Other Active Problems  Elevated glucose levels. Suspect diabetes mellitus - previously undiagnosed.  Hospital day # 1  Acute infarct, likely left hemisphere subcortical, s/p IV tPA.  The right arm weakness present on arrival is not there anymore.  BP is well controlled.  Smoking is major risk factor and willing to quit with Nicoderm patch so will start 21 mg/day when cravings hit.    Awaiting further imaging and diagnostic studies.  He was not on any antiplatelet agent when this happened, so will start ASA 81 mg qd in about 2 hours which would be 24 hours post IV t-PA.    Weston Settle, MD  To contact Stroke Continuity provider, please refer to [email or web address]. After hours, contact General Neurology

## 2016-07-04 ENCOUNTER — Inpatient Hospital Stay (HOSPITAL_COMMUNITY): Payer: Medicaid Other

## 2016-07-04 DIAGNOSIS — I63419 Cerebral infarction due to embolism of unspecified middle cerebral artery: Secondary | ICD-10-CM

## 2016-07-04 DIAGNOSIS — I6789 Other cerebrovascular disease: Secondary | ICD-10-CM

## 2016-07-04 LAB — GLUCOSE, CAPILLARY
GLUCOSE-CAPILLARY: 168 mg/dL — AB (ref 65–99)
Glucose-Capillary: 208 mg/dL — ABNORMAL HIGH (ref 65–99)
Glucose-Capillary: 232 mg/dL — ABNORMAL HIGH (ref 65–99)
Glucose-Capillary: 295 mg/dL — ABNORMAL HIGH (ref 65–99)

## 2016-07-04 LAB — ECHOCARDIOGRAM COMPLETE
HEIGHTINCHES: 73 in
WEIGHTICAEL: 3865.99 [oz_av]

## 2016-07-04 MED ORDER — ASPIRIN EC 325 MG PO TBEC
325.0000 mg | DELAYED_RELEASE_TABLET | Freq: Every day | ORAL | Status: DC
  Administered 2016-07-05: 325 mg via ORAL
  Filled 2016-07-04: qty 1

## 2016-07-04 MED ORDER — FAMOTIDINE 20 MG PO TABS
20.0000 mg | ORAL_TABLET | Freq: Two times a day (BID) | ORAL | Status: DC
  Administered 2016-07-04 – 2016-07-05 (×3): 20 mg via ORAL
  Filled 2016-07-04 (×3): qty 1

## 2016-07-04 MED ORDER — ATORVASTATIN CALCIUM 10 MG PO TABS: 20.0000 mg | ORAL_TABLET | Freq: Every day | ORAL | Status: DC

## 2016-07-04 NOTE — Evaluation (Addendum)
Physical Therapy Evaluation Patient Details Name: Matthew Moses MRN: [id number] DOB: 1950/12/11 Today's Date: 07/04/2016   History of Present Illness  Pt is a 65 y/o M who presented to outside ED with c/o dysarthria.  He was noted to have significant Rt facial droop as well as a Rt pronator drift.  TPA was administered.  CT head with no acute intracranial abnormality. MRA completed with acute infarct posterior limb internal capsule on the Lt.  Pt's PMH includes CAD, diabetes, HTN.     Clinical Impression  Pt admitted with above diagnosis. Matthew Moses presents with dysarthria and Rt facial droop, no other impairments identified.   He is independent with all aspects of mobility and tolerated high level balance activities while ambulating.  PT will sign off.    Follow Up Recommendations No PT follow up    Equipment Recommendations  None recommended by PT    Recommendations for Other Services Speech consult     Precautions / Restrictions Precautions Precautions: None Restrictions Weight Bearing Restrictions: No      Mobility  Bed Mobility Overal bed mobility: Independent                Transfers Overall transfer level: Independent Equipment used: None             General transfer comment: No cues or physical assist needed  Ambulation/Gait Ambulation/Gait assistance: Independent Ambulation Distance (Feet): 500 Feet Assistive device: None Gait Pattern/deviations: WFL(Within Functional Limits)   Gait velocity interpretation: at or above normal speed for age/gender General Gait Details: No instability.  Able to complete high level balance activites while ambulating without impairments.  Stairs            Wheelchair Mobility    Modified Rankin (Stroke Patients Only) Modified Rankin (Stroke Patients Only) Pre-Morbid Rankin Score: No symptoms Modified Rankin: No significant disability     Balance Overall balance assessment: Independent                                Standardized Balance Assessment Standardized Balance Assessment : Dynamic Gait Index   Dynamic Gait Index Level Surface: Normal Change in Gait Speed: Normal Gait with Horizontal Head Turns: Normal Gait with Vertical Head Turns: Normal Gait and Pivot Turn: Normal Step Over Obstacle: Normal Step Around Obstacles: Normal       Pertinent Vitals/Pain Pain Assessment: No/denies pain    Home Living Family/patient expects to be discharged to:: Private residence Living Arrangements: Spouse/significant other (girlfriend) Available Help at Discharge: Friend(s);Available PRN/intermittently (girlfriend works during the day as Tax inspector) Type of Home: Other(Comment) (condo) Home Access: Level entry     Home Layout: One level Home Equipment: None      Prior Function Level of Independence: Independent         Comments: Pt had just recently completed a landscaping project for his yard.  Is retired.     Hand Dominance   Dominant Hand: Right    Extremity/Trunk Assessment   Upper Extremity Assessment: Overall WFL for tasks assessed           Lower Extremity Assessment: Overall WFL for tasks assessed      Cervical / Trunk Assessment: Normal  Communication   Communication: Other (comment) (dysarthria)  Cognition Arousal/Alertness: Awake/alert Behavior During Therapy: WFL for tasks assessed/performed Overall Cognitive Status: Within Functional Limits for tasks assessed  General Comments General comments (skin integrity, edema, etc.): BP sitting at start of session 114/80, supine at end of session 138/76.  Reviewed signs and symptoms of a stroke with pt and pt verbalized understanding.    Exercises        Assessment/Plan    PT Assessment Patent does not need any further PT services  PT Diagnosis Hemiplegia dominant side   PT Problem List    PT Treatment Interventions     PT Goals (Current goals can be  found in the Care Plan section) Acute Rehab PT Goals Patient Stated Goal: to go home PT Goal Formulation: All assessment and education complete, DC therapy    Frequency     Barriers to discharge        Co-evaluation               End of Session Equipment Utilized During Treatment: Gait belt Activity Tolerance: Patient tolerated treatment well Patient left: in bed;with call bell/phone within reach Nurse Communication: Mobility status         Time: 0810-0824 PT Time Calculation (min) (ACUTE ONLY): 14 min   Charges:   PT Evaluation $PT Eval Low Complexity: 1 Procedure     PT G Codes:       Matthew Moses PT, DPT  Pager: [phone] Phone: [phone] 07/04/2016, 8:50 AM

## 2016-07-04 NOTE — Progress Notes (Signed)
Key Points: Use following P&T approved IV to PO antibiotic change policy.  Description contains the criteria that are approved Note: Policy Excludes:  Esophagectomy patientsPHARMACIST - PHYSICIAN COMMUNICATION DR:   Hilda Blades CONCERNING: IV to Oral Route Change Policy  RECOMMENDATION: This patient is receiving famotidine by the intravenous route.  Based on criteria approved by the Pharmacy and Therapeutics Committee, the intravenous medication(s) is/are being converted to the equivalent oral dose form(s).   DESCRIPTION: These criteria include:  The patient is eating (either orally or via tube) and/or has been taking other orally administered medications for a least 24 hours  The patient has no evidence of active gastrointestinal bleeding or impaired GI absorption (gastrectomy, short bowel, patient on TNA or NPO).  If you have questions about this conversion, please contact the Pharmacy Department  []   [phone] )  [phone] []   [phone] )  [facility] [x]   [phone] )  [facility] [facility] []   [phone] )  [facility] []   [phone] )  [facility]   ( [phone] [place], [facility] 07/04/2016 10:14 AM

## 2016-07-04 NOTE — Progress Notes (Signed)
Report called and given to Marylene Land, RN on 41M.

## 2016-07-04 NOTE — Progress Notes (Signed)
STROKE TEAM PROGRESS NOTE   HISTORY OF PRESENT ILLNESS (per record) Ogle is a pleasant 65 year old gentleman with a past medical history of hypertension, hyperlipidemia, coronary artery disease, and diabetes. He presented to an outside emergency room with a complaint of dysarthria. He was noted to have a significant right facial droop as well as a right pronator drift. Hikaru has been somewhat inconsistent in his description of the onset of his symptoms. Initially he stated they were 11:30, however he then corrected himself to 12:15, at present he feels quite certain the onset was 1:30. He was evaluated by Dr. Ivin Booty long who felt that he was likely within the 3 hour TPA window and certainly within the 4.5 hour TPA window.  In a thorough discussion with Dr. Jacqulyn Bath, all risks factors for TPA were discussed in detail. The fact that the symptoms were relatively mild was considered a relative contraindication to TPA. However the symptoms are quite disturbing to Dorinda Hill and on further discussion Zedrick requested that TPA be administered. The risk and benefits of TPA were discussed in detail. Adekunle was subsequently transferred here to [facility] ICU for further observation and care.  Dull reports that he does not like taking medication and has not actually been taking any medication for his various medical conditions. He subsequently blamed this on a problem with a rehabilitation facility that he is staying at. He reports he had fallen several years ago and injured his right arm and leg. He notes that since that time he has been institutionalized in the institution he states that will not provide him with his medications. We will be contacting social services to see if they can assist him. Ulrick reports smoking on a daily basis. He reports no prior history of stroke.   SUBJECTIVE (INTERVAL HISTORY) Feels well.  No headaches.  Some slurred speech.  He is a smoker.  No cravings yet.     CDUS, TTE  pending.   OBJECTIVE Temp:  [98.1 F (36.7 C)-98.5 F (36.9 C)] 98.2 F (36.8 C) (08/13 0800) Pulse Rate:  [46-83] 49 (08/13 0800) Cardiac Rhythm: Sinus bradycardia (08/13 0800) Resp:  [5-28] 23 (08/13 0800) BP: (92-139)/(43-77) 92/59 (08/13 0800) SpO2:  [94 %-98 %] 95 % (08/13 0800)  CBC:   Recent Labs Lab 07/02/16 1405  WBC 10.3  NEUTROABS 5.8  HGB 16.3  HCT 46.1  MCV 86.5  PLT 196    Basic Metabolic Panel:   Recent Labs Lab 07/02/16 1430  NA 134*  K 3.7  CL 100*  CO2 26  GLUCOSE 309*  BUN 11  CREATININE 0.76  CALCIUM 8.5*    Lipid Panel:     Component Value Date/Time   CHOL 159 07/03/2016 0424   TRIG 183 (H) 07/03/2016 0424   HDL 24 (L) 07/03/2016 0424   CHOLHDL 6.6 07/03/2016 0424   VLDL 37 07/03/2016 0424   LDLCALC 98 07/03/2016 0424   HgbA1c: No results found for: HGBA1C Urine Drug Screen:     Component Value Date/Time   LABOPIA NONE DETECTED 07/02/2016 1646   COCAINSCRNUR NONE DETECTED 07/02/2016 1646   LABBENZ NONE DETECTED 07/02/2016 1646   AMPHETMU NONE DETECTED 07/02/2016 1646   THCU POSITIVE (A) 07/02/2016 1646   LABBARB NONE DETECTED 07/02/2016 1646      IMAGING  Ct Head Code Stroke W/o Cm 07/02/2016 1. No acute intracranial abnormality.  2. ASPECTS score 10     MRI / MRA Head / Brain  07/03/2016 Acute infarct posterior limb internal capsule  on the left. The patient was not able to complete the MRI. CT scan will be performed. Negative MRA head   Ct Head without Contrast 07/03/2016 Acute infarct posterior limb internal capsule on the left.  Negative for hemorrhage.     PHYSICAL EXAM  Awake, alert, fully oriented. Language-fluent.  Comprehension, naming, repetition- intact. Dysarthria.  Significant right lower facial droop.  EOMI. PERL. Strength 5/5 bilaterally.  Subtle right pronator drift. Coordination, sensory - intact Gait- deferred.    ASSESSMENT/PLAN Mr. Issachar Broady is a 65 y.o. male with history of  hypertension, coronary artery bypass graft surgery, hyperlipidemia, and coronary artery disease presenting with right facial droop and right pronator drift.  He received IV TPA on Friday, 07/02/2016 at 1545.  Stroke:  Dominant infarct embolic secondary to an unknown source.  Resultant  - right facial droop  MRI  Acute infarct posterior limb internal capsule on the left.  MRA  Negative MRA head  Carotid Doppler  1-39% ICA plaquing.  Vertebral artery flow is antegrade.  2D Echo  pending  LDL - 98  HgbA1c pending  VTE prophylaxis - SCDs Diet heart healthy/carb modified Room service appropriate? Yes; Fluid consistency: Thin  No antithrombotic prior to admission, now on No antithrombotic - secondary to TPA therapy.  Patient counseled to be compliant with his antithrombotic medications  Ongoing aggressive stroke risk factor management  Therapy recommendations:  No follow-up physical therapy recommended.  Disposition:  Pending  Hypertension  Stable - although mildly low blood pressures  Permissive hypertension (OK if < 220/120) but gradually normalize in 5-7 days  Long-term BP goal normotensive  Hyperlipidemia  Home meds: No lipid lowering medications prior to admission  LDL 98, goal < 70  Add Lipitor 20 mg daily  Continue statin at discharge    Other Stroke Risk Factors  Advanced age  Cigarette smoker - advised to stop smoking  Obesity, Body mass index is 31.88 kg/m., recommend weight loss, diet and exercise as appropriate   UDS positive for [facility]  Other Active Problems  Elevated glucose levels. Suspect diabetes mellitus - previously undiagnosed. Hemoglobin A1c pending   PLAN  Bmet in a.m.  Hospital day # 2  Acute infarct, left basal ganglia, s/p IV tPA.  He has a subtle right pronator drift.  The facial weakness is pronounced.  BP is well controlled.  Smoking is major risk factor and willing to quit with Nicoderm patch so will start 21 mg/day when  cravings hit.    No hemorrhagic transformation from the IV tPA.   ASA 81 mg qd started.   RA related pain- well controlled on Tramadol.  He is insistent on going home, however, I managed to convince him to stay another night while we await CDUS and TTE results.    Weston Settle, MD  To contact Stroke Continuity provider, please refer to [email or web address]. After hours, contact General Neurology

## 2016-07-04 NOTE — Progress Notes (Signed)
  Echocardiogram 2D Echocardiogram has been performed.  Matthew Moses 07/04/2016, 12:29 PM

## 2016-07-04 NOTE — Progress Notes (Signed)
Received from Neuro ICU via wheelchair; patient is alert and oriented; oriented to room and unit routine.

## 2016-07-04 NOTE — Progress Notes (Signed)
VASCULAR LAB PRELIMINARY  PRELIMINARY  PRELIMINARY  PRELIMINARY  Carotid duplex completed.    Preliminary report:  1-39% ICA plaquing.  Vertebral artery flow is antegrade.  Cortavius Montesinos, RVT 07/04/2016, 9:07 AM

## 2016-07-05 DIAGNOSIS — I10 Essential (primary) hypertension: Secondary | ICD-10-CM

## 2016-07-05 DIAGNOSIS — E785 Hyperlipidemia, unspecified: Secondary | ICD-10-CM

## 2016-07-05 DIAGNOSIS — M069 Rheumatoid arthritis, unspecified: Secondary | ICD-10-CM | POA: Diagnosis present

## 2016-07-05 DIAGNOSIS — I63312 Cerebral infarction due to thrombosis of left middle cerebral artery: Secondary | ICD-10-CM

## 2016-07-05 DIAGNOSIS — E1159 Type 2 diabetes mellitus with other circulatory complications: Secondary | ICD-10-CM

## 2016-07-05 DIAGNOSIS — E118 Type 2 diabetes mellitus with unspecified complications: Secondary | ICD-10-CM

## 2016-07-05 DIAGNOSIS — Z720 Tobacco use: Secondary | ICD-10-CM

## 2016-07-05 DIAGNOSIS — E669 Obesity, unspecified: Secondary | ICD-10-CM

## 2016-07-05 DIAGNOSIS — F1210 Cannabis abuse, uncomplicated: Secondary | ICD-10-CM

## 2016-07-05 DIAGNOSIS — F17210 Nicotine dependence, cigarettes, uncomplicated: Secondary | ICD-10-CM | POA: Diagnosis present

## 2016-07-05 LAB — CBC
HCT: 45.4 % (ref 39.0–52.0)
Hemoglobin: 15.4 g/dL (ref 13.0–17.0)
MCH: 30.3 pg (ref 26.0–34.0)
MCHC: 33.9 g/dL (ref 30.0–36.0)
MCV: 89.2 fL (ref 78.0–100.0)
PLATELETS: 162 10*3/uL (ref 150–400)
RBC: 5.09 MIL/uL (ref 4.22–5.81)
RDW: 12.8 % (ref 11.5–15.5)
WBC: 9.4 10*3/uL (ref 4.0–10.5)

## 2016-07-05 LAB — BASIC METABOLIC PANEL
Anion gap: 7 (ref 5–15)
BUN: 10 mg/dL (ref 6–20)
CO2: 27 mmol/L (ref 22–32)
CREATININE: 0.79 mg/dL (ref 0.61–1.24)
Calcium: 8.7 mg/dL — ABNORMAL LOW (ref 8.9–10.3)
Chloride: 102 mmol/L (ref 101–111)
GFR calc Af Amer: >60 mL/min (ref >60)
Glucose, Bld: 217 mg/dL — ABNORMAL HIGH (ref 65–99)
Potassium: 4.1 mmol/L (ref 3.5–5.1)
SODIUM: 136 mmol/L (ref 135–145)

## 2016-07-05 LAB — VAS US CAROTID
LCCADDIAS: -21 cm/s
LEFT ECA DIAS: -25 cm/s
LEFT VERTEBRAL DIAS: 13 cm/s
LICADDIAS: -23 cm/s
LICADSYS: -86 cm/s
Left CCA dist sys: -73 cm/s
Left CCA prox dias: 21 cm/s
Left CCA prox sys: 88 cm/s
Left ICA prox dias: -22 cm/s
Left ICA prox sys: -72 cm/s
RCCADSYS: -89 cm/s
RCCAPSYS: 75 cm/s
RIGHT ECA DIAS: -20 cm/s
RIGHT VERTEBRAL DIAS: -11 cm/s
Right CCA prox dias: 14 cm/s

## 2016-07-05 LAB — GLUCOSE, CAPILLARY
GLUCOSE-CAPILLARY: 237 mg/dL — AB (ref 65–99)
GLUCOSE-CAPILLARY: 291 mg/dL — AB (ref 65–99)

## 2016-07-05 LAB — HEMOGLOBIN A1C
Hgb A1c MFr Bld: 11.8 % — ABNORMAL HIGH (ref 4.8–5.6)
MEAN PLASMA GLUCOSE: 292 mg/dL

## 2016-07-05 MED ORDER — ONETOUCH ULTRASOFT LANCETS MISC: 12 refills | Status: DC

## 2016-07-05 MED ORDER — ATORVASTATIN CALCIUM 20 MG PO TABS: 20.0000 mg | ORAL_TABLET | Freq: Every day | ORAL | 2 refills | Status: DC

## 2016-07-05 MED ORDER — BLOOD GLUCOSE METER KIT: PACK | 2 refills | Status: DC

## 2016-07-05 MED ORDER — INSULIN GLARGINE 100 UNIT/ML ~~LOC~~ SOLN: 20.0000 [IU] | Freq: Every day | SUBCUTANEOUS | 11 refills | Status: DC

## 2016-07-05 MED ORDER — ASPIRIN 325 MG PO TBEC: 325.0000 mg | DELAYED_RELEASE_TABLET | Freq: Every day | ORAL | 0 refills | Status: DC

## 2016-07-05 MED ORDER — LIVING WELL WITH DIABETES BOOK
Freq: Once | Status: AC
  Administered 2016-07-05: 12:00:00
  Filled 2016-07-05: qty 1

## 2016-07-05 MED ORDER — INSULIN GLARGINE 100 UNIT/ML ~~LOC~~ SOLN
20.0000 [IU] | Freq: Every day | SUBCUTANEOUS | Status: DC
  Filled 2016-07-05: qty 0.2

## 2016-07-05 NOTE — Progress Notes (Addendum)
Inpatient Diabetes Program Recommendations  AACE/ADA: New Consensus Statement on Inpatient Glycemic Control (2015)  Target Ranges:  Prepandial:   less than 140 mg/dL      Peak postprandial:   less than 180 mg/dL (1-2 hours)      Critically ill patients:  140 - 180 mg/dL   Results for Matthew Moses, Matthew Moses (MRN [id number]) as of 07/05/2016 11:17  Ref. Range 07/04/2016 07:45 07/04/2016 11:39 07/04/2016 16:47 07/04/2016 23:58 07/05/2016 06:28  Glucose-Capillary Latest Ref Range: 65 - 99 mg/dL 841 (H) 660 (H) 630 (H) 168 (H) 237 (H)   Results for Matthew Moses, Matthew Moses (MRN [id number]) as of 07/05/2016 11:17  Ref. Range 07/03/2016 04:24  Hemoglobin A1C Latest Ref Range: 4.8 - 5.6 % 11.8 (H)   Review of Glycemic Control  Diabetes history: DM2 Outpatient Diabetes medications: None listed.Metformin  Listed in previous notes Current orders for Inpatient glycemic control: Novolog correction 0-20 units tid + 0-5 units hs  Inpatient Diabetes Program Recommendations:    Please consider starting basal insulin Lantus 22 units daily (0.2 units/kg). Will plan to speak to patient concerning elevated A1c and order Living Well With Diabetes book and DM videos when appropriate to watch.  Patient needs prescription for meter and strips on discharge #[id number].  Thank you, Billy Fischer. Ivey Nembhard, RN, MSN, CDE Inpatient Glycemic Control Team Team Pager [phone] (8am-5pm) 07/05/2016 11:23 AM

## 2016-07-05 NOTE — Discharge Summary (Signed)
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  Past Medical History:  Diagnosis Date  . Coronary artery disease   . High cholesterol   . Hx of CABG   . Hypertension    Past Surgical History:  Procedure Laterality Date  . CORONARY ARTERY BYPASS GRAFT    . KNEE SURGERY    . MUSCLE REPAIR    . TONSILLECTOMY        Medication List    TAKE these medications   aspirin 325 MG EC tablet Take 1 tablet (325 mg total) by mouth daily.   atorvastatin 20 MG tablet Commonly known as:  LIPITOR Take 1 tablet (20 mg total) by mouth daily at 6 PM.   blood glucose meter kit and supplies Dispense based on patient and insurance preference. Use up to four times daily as directed. (FOR ICD-9 250.00, 250.01).   insulin glargine 100 UNIT/ML injection Commonly known as:  LANTUS Inject 0.2 mLs (20 Units total) into the skin at bedtime.   naproxen 500 MG tablet Commonly known as:  NAPROSYN Take 1 tablet (500 mg total) by mouth 2 (two) times daily.   onetouch ultrasoft lancets Use as instructed   promethazine 25 MG tablet Commonly known as:  PHENERGAN Take 1 tablet (25 mg total) by mouth every 6 (six) hours as needed for nausea or vomiting.       LABORATORY STUDIES CBC    Component Value Date/Time   WBC 9.4 07/05/2016 0530   RBC 5.09 07/05/2016 0530   HGB 15.4 07/05/2016 0530   HCT 45.4 07/05/2016 0530   PLT 162  07/05/2016 0530   MCV 89.2 07/05/2016 0530   MCH 30.3 07/05/2016 0530   MCHC 33.9 07/05/2016 0530   RDW 12.8 07/05/2016 0530   LYMPHSABS 3.4 07/02/2016 1405   MONOABS 0.9 07/02/2016 1405   EOSABS 0.2 07/02/2016 1405   BASOSABS 0.1 07/02/2016 1405   CMP    Component Value Date/Time   NA 136 07/05/2016 0530   K 4.1 07/05/2016 0530   CL 102 07/05/2016 0530   CO2 27 07/05/2016 0530   GLUCOSE 217 (H) 07/05/2016 0530   BUN 10 07/05/2016 0530   CREATININE 0.79 07/05/2016 0530   CALCIUM 8.7 (L) 07/05/2016 0530   PROT 6.7 07/02/2016 1430   ALBUMIN 3.5 07/02/2016 1430   AST 16 07/02/2016 1430   ALT 14 (L) 07/02/2016 1430   ALKPHOS 59 07/02/2016 1430   BILITOT 0.9 07/02/2016 1430   GFRNONAA >60 07/05/2016 0530   GFRAA >60 07/05/2016 0530   COAGS Lab Results  Component Value Date   INR 0.99 07/02/2016   Lipid Panel    Component Value Date/Time   CHOL 159 07/03/2016 0424   TRIG 183 (H) 07/03/2016 0424   HDL 24 (L) 07/03/2016 0424   CHOLHDL 6.6 07/03/2016 0424   VLDL 37 07/03/2016 0424   LDLCALC  98 07/03/2016 0424   HgbA1C  Lab Results  Component Value Date   HGBA1C 11.8 (H) 07/03/2016   Urinalysis    Component Value Date/Time   COLORURINE YELLOW 07/02/2016 1645   APPEARANCEUR CLEAR 07/02/2016 1645   LABSPEC 1.016 07/02/2016 1645   PHURINE 7.0 07/02/2016 1645   GLUCOSEU >1000 (A) 07/02/2016 1645   HGBUR NEGATIVE 07/02/2016 1645   BILIRUBINUR NEGATIVE 07/02/2016 1645   KETONESUR NEGATIVE 07/02/2016 1645   PROTEINUR NEGATIVE 07/02/2016 1645   NITRITE NEGATIVE 07/02/2016 1645   LEUKOCYTESUR SMALL (A) 07/02/2016 1645   Urine Drug Screen     Component Value Date/Time   LABOPIA NONE DETECTED 07/02/2016 1646   COCAINSCRNUR NONE DETECTED 07/02/2016 1646   LABBENZ NONE DETECTED 07/02/2016 1646   AMPHETMU NONE DETECTED 07/02/2016 1646   THCU POSITIVE (A) 07/02/2016 1646   LABBARB NONE DETECTED 07/02/2016 1646    Alcohol Level    Component Value Date/Time   ETH <5  07/02/2016 1430     SIGNIFICANT DIAGNOSTIC STUDIES  CT Head Code Stroke W/o Cm 07/02/2016 1. No acute intracranial abnormality.  2. ASPECTS score 10   MRI / MRA Head  07/03/2016 Acute infarct posterior limb internal capsule on the left. The patient was not able to complete the MRI. CT scan will be performed. Negative MRA head  Ct Head without Contrast 07/03/2016 Acute infarct posterior limb internal capsule on the left.  Negative for hemorrhage.  2D Echocardiogram - Left ventricle: The cavity size was normal. Wall thickness was normal. Systolic function was normal. The estimated ejection fraction was in the range of 60% to 65%. Wall motion was normal; there were no regional wall motion abnormalities. Left ventricular diastolic function parameters were normal. - Aortic valve: Valve area (VTI): 3.36 cm^2. Valve area (Vmax): 3.19 cm^2. Valve area (Vmean): 3.16 cm^2. - Left atrium: The atrium was mildly dilated. - Technically difficult study.  Carotid Doppler   Bilateral: intimal wall thickening CCA. Mild mixed plaque origin ICA. Vertebral artery flow is antegrade.     HISTORY OF PRESENT ILLNESS Matthew Moses is a pleasant 65 year old gentleman with a past medical history of hypertension, hyperlipidemia, coronary artery disease, and diabetes. He presented to an outside emergency room with a complaint of dysarthria. He was noted to have a significant right facial droop as well as a right pronator drift. Matthew Moses has been somewhat inconsistent in his description of the onset of his symptoms. He presented on 07/02/2016. Initially he stated symptoms started in 11:30, however he then corrected himself to 12:15, he finally felt quite certain the onset was 1:30. He was evaluated by Dr. Vonna Kotyk long who felt that he was likely within the 3 hour TPA window and certainly within the 4.5 hour TPA window.  In a thorough discussion with Dr. Laverta Baltimore, all risks factors for TPA were discussed in detail. The fact  that the symptoms were relatively mild was considered a relative contraindication to TPA. However the symptoms are quite disturbing to Matthew Moses and on further discussion Matthew Moses requested that TPA be administered. The risk and benefits of TPA were discussed in detail. Zakee was subsequently transferred here to [facility] ICU for further observation and care.  Matthew Moses reports that he does not like taking medication and has not actually been taking any medication for his various medical conditions. He subsequently blamed this on a problem with a rehabilitation facility that he is staying at. He reports he had fallen several years ago and injured his right arm and leg. He notes  that since that time he has been institutionalized in the institution he states that will not provide him with his medications. We will be contacting social services to see if they can assist him. Matthew Moses reports smoking on a daily basis. He reports no prior history of stroke.   HOSPITAL COURSE Matthew Moses is a 65 y.o. male with history of hypertension, coronary artery bypass graft surgery, hyperlipidemia, and coronary artery disease presenting with right facial droop and right pronator drift.  He received IV TPA on Friday, 07/02/2016 at 1545.  Stroke:  Dominant L posterior limb internal capsule infarct s/p IV tPA, infarct secondary to small vessel disease  Resultant  - right facial droop  Tolerated tPA without difficulty. No hemorrhagic transformation.  MRI  Acute infarct posterior limb internal capsule on the left.  MRA  Negative MRA head  Carotid Doppler  No significant stenosis   2D Echo  EF 60-65% No source of embolus   LDL - 98  HgbA1c 11.8  No antithrombotic prior to admission, now on aspirin 325 mg daily. Continue at discharge.  Patient counseled to be compliant with his antithrombotic medications  Ongoing aggressive stroke risk factor management  Therapy recommendations:  No follow-up therapy needs    Disposition:  return home  Hypertension  Stable - although mildly low blood pressures  Long-term BP goal normotensive  Hyperlipidemia  Home meds: No lipid lowering medications prior to admission  LDL 98, goal < 70  Added Lipitor 20 mg daily  Continue statin at discharge  Diabetes, type II, uncontrolled  HgbA1c 11.8, goal < 7.0   Discharged on Lantus 20 units daily at bedtime  Starter kit ordered  Other Stroke Risk Factors  Advanced age  Cigarette smoker - advised to stop smoking  Obesity, Body mass index is 31.88 kg/m., recommend weight loss, diet and exercise as appropriate   UDS positive for THCU, advised to stop smoking marijuana  Other medical conditions  Rheumatoid arthritis, well controlled on tramadol   DISCHARGE EXAM Blood pressure 121/69, pulse 61, temperature 98.5 F (36.9 C), temperature source Oral, resp. rate 18, height _0  (1.854 m), weight 109.6 kg (241 lb 10 oz), SpO2 97 %. Awake, alert, fully oriented. Language-fluent.  Comprehension, naming, repetition- intact. Dysarthria.  Significant right lower facial droop.  EOMI. PERL. Strength 5/5 bilaterally.  Subtle right pronator drift. Coordination, sensory - intact Gait- deferred.   Discharge Diet   Diet regular Room service appropriate? Yes; Fluid consistency: Thin liquids  DISCHARGE PLAN  Disposition:  home  aspirin 325 mg daily for secondary stroke prevention.  Ongoing risk factor control by Primary Care Physician at time of discharge  Follow-up Dr. Vista Lawman Friday, July 09, 2016 at 10 AM  Follow-up with Dr. Rosalin Hawking, Stroke Clinic in 2 months, office to schedule an appointment.  50 minutes were spent preparing discharge.  Burnetta Sabin, NP  [facility] 07/05/2016 2:17 PM   I, the attending vascular neurologist, have personally obtained a history, examined the patient, evaluated laboratory data, individually viewed imaging studies and agree with  radiology interpretations. Together with the NP/PA, we formulated the assessment and plan of care which reflects our mutual decision.  I have made any additions or clarifications directly to the above note and agree with the findings and plan as currently documented.   65 yo M with hx of HTN, HLD, CAD s/p CABG, DM, smoker admitted for left PLIC infarcts. Stroke work up negative including MRA, CUS, TTE. However, LDL 98 and  A1C 11.8, uncontrolled DM. Scheduled PCP visit this Friday. On ASA and lipitor. D/c in good condition. Follow up in clinic in 2 months.   Rosalin Hawking, MD PhD Stroke Neurology 07/05/2016 11:23 PM

## 2016-07-05 NOTE — Care Management Note (Signed)
Case Management Note  Patient Details  Name: Matthew Moses MRN: [id number] Date of Birth: 04/10/1951  Subjective/Objective:                    Action/Plan: Pt discharging home with self care. CM received consult to assist patient in obtaining new PCP. Pt has Medicaid and is unhappy with his current PCP. CM was able to obtain him an appointment at Dr Osei-Bonsu's office on Friday. Pt also inquiring about a CBG machine and strips. Pt states his Medicaid has a glich and currently it wont cover these items. CM talked with Nicolette Bang and he can obtain a meter for $9 and strips 25 for $5. Pt informed and stated he could afford this and would obtain them at [facility]. CM inquired about transportation home and pt states he has a ride. Bedside RN updated.   Expected Discharge Date:                  Expected Discharge Plan:  Home/Self Care  In-House Referral:     Discharge planning Services  CM Consult  Post Acute Care Choice:    Choice offered to:     DME Arranged:    DME Agency:     HH Arranged:    HH Agency:     Status of Service:  Completed, signed off  If discussed at [employer] of Stay Meetings, dates discussed:    Additional Comments:  [name], RN 07/05/2016, 12:47 PM

## 2016-10-08 ENCOUNTER — Other Ambulatory Visit: Payer: Self-pay

## 2016-10-08 NOTE — Patient Outreach (Signed)
First telephone outreach attempt to patient to obtain mRS score. No answer, left message for return call.   Wynona Canes  [facility] Care Management Assistant

## 2016-10-12 ENCOUNTER — Other Ambulatory Visit: Payer: Self-pay

## 2016-10-12 NOTE — Patient Outreach (Signed)
Second telephone outreach attempt to obtain mRS. No answer, left message for return call.   Nicole Oryn Casanova, B.A.  [facility] Care Management Assistant  

## 2016-10-15 ENCOUNTER — Other Ambulatory Visit: Payer: Self-pay

## 2016-10-15 NOTE — Patient Outreach (Signed)
Telephone outreach for third and final attempt to obtain mRS. No answer, left message for return call.   Matthew Moses  [facility] Care Management Assistant

## 2016-10-19 NOTE — Patient Outreach (Signed)
3 telephone outreach attempts were completed to obtain mRS for patient. mRS could not be obtained because messages left for the patient requesting a return phone call but no return call received. mRS = 7   Sherle Poe, [place]   [facility] Care Management Assistant

## 2017-01-17 IMAGING — MR MR HEAD W/O CM
6 series · 33 of 48 positions shown · non-contrast
Comparison: None.

CLINICAL DATA: Stroke.  Post tPA.

EXAM:
MRI HEAD WITHOUT CONTRAST
MRA HEAD WITHOUT CONTRAST
TECHNIQUE: Multiplanar, multiecho pulse sequences of the brain and surrounding
structures were obtained without intravenous contrast. Angiographic
images of the head were obtained using MRA technique without
contrast.

[Series 3: DWI · axial · 3.0mm · 0.94mm/px · z∈[-19,+124]mm · 11 of 100 slices shown (1 of 2)]
[im 1/100]
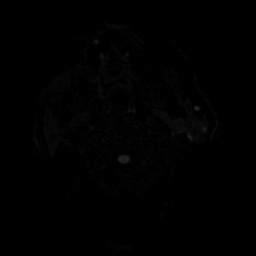
[im 10/100]
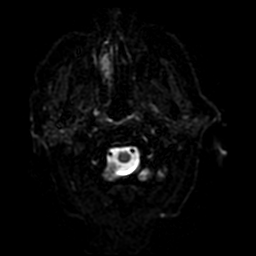
[im 20/100]
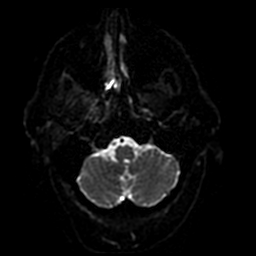
[im 30/100]
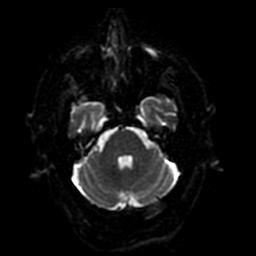
[im 40/100]
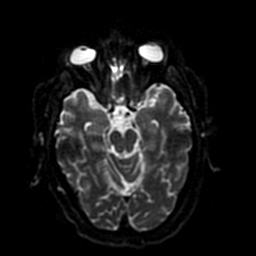
[im 50/100]
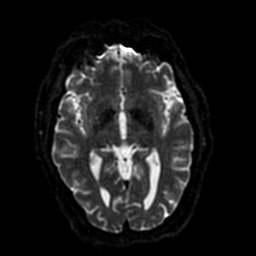
[im 60/100]
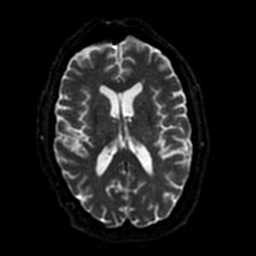
[im 70/100]
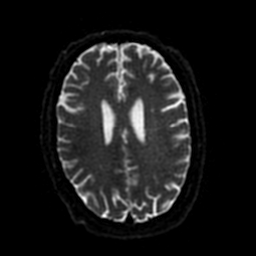
[im 80/100]
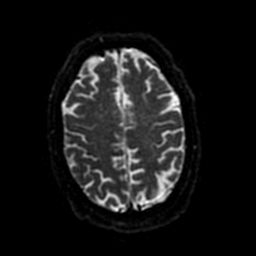
[im 90/100]
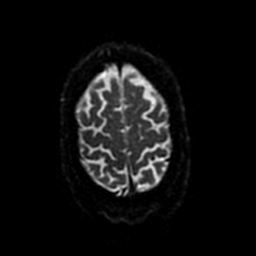
[im 100/100]
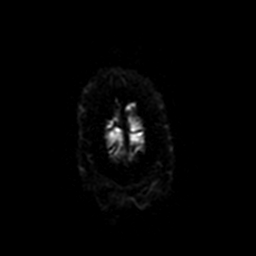

[Series 4: FLAIR · sagittal · 5.0mm · 0.47mm/px · 3 of 23 slices shown]
[im 1/23]
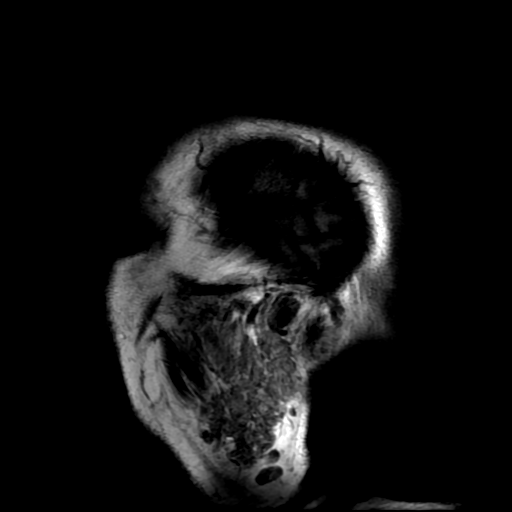
[im 12/23]
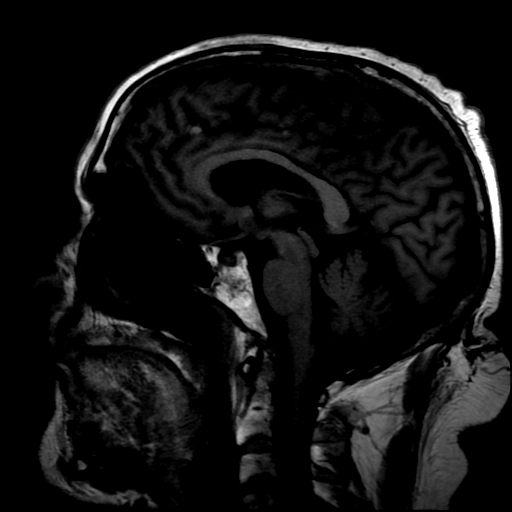
[im 23/23]
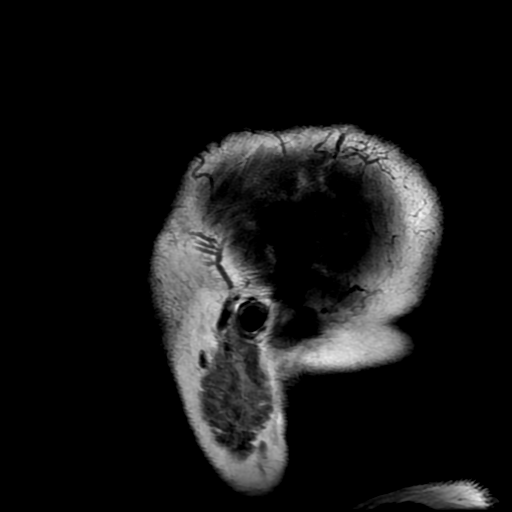

[Series 5: ax (id) 2 · axial · 1.0mm · 0.43mm/px · z∈[-46,+14]mm · 6 of 188 slices shown]
[im 10/188]
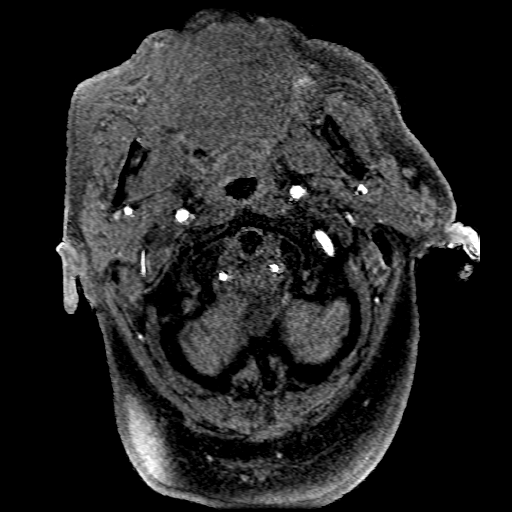
[im 29/188]
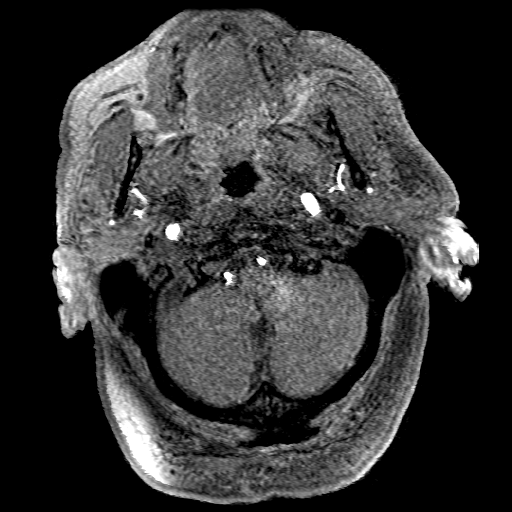
[im 57/188]
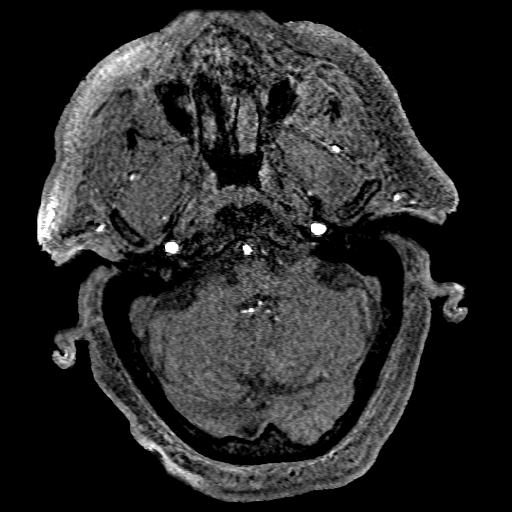
[im 85/188]
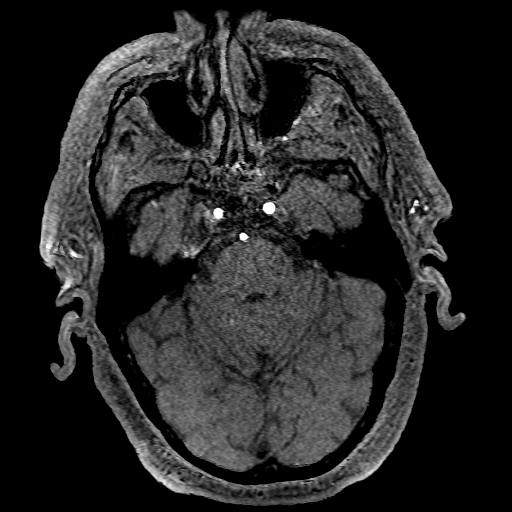
[im 103/188]
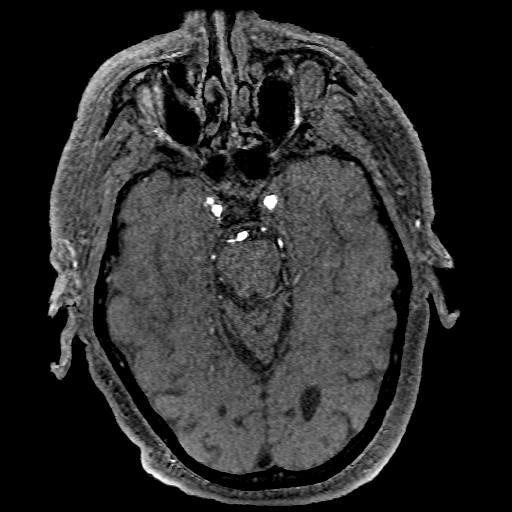
[im 131/188]
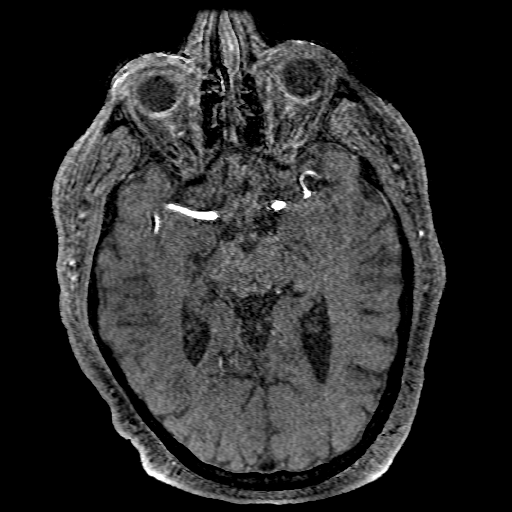

[Series 6: T2 · axial · 5.0mm · 0.47mm/px · z∈[-43,+122]mm · 3 of 29 slices shown]
[im 1/29]
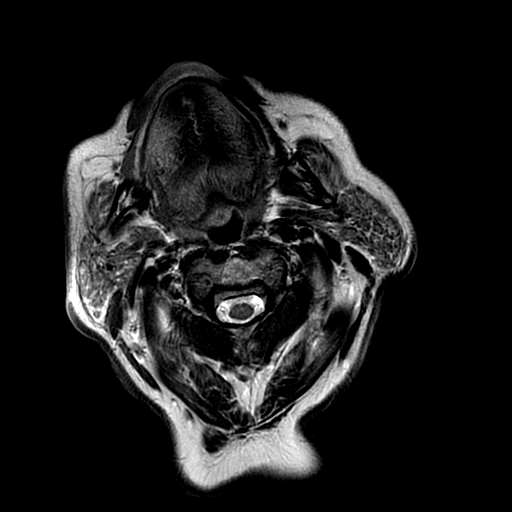
[im 15/29]
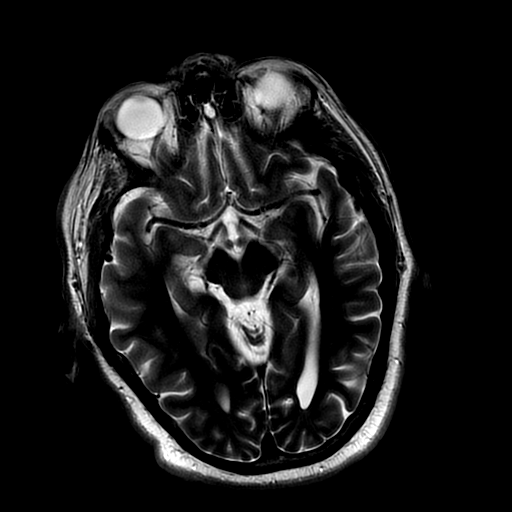
[im 29/29]
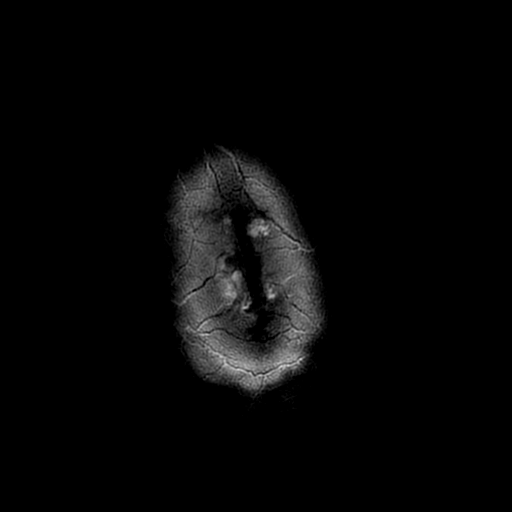

[Series 8: DWI · coronal · 4.0mm · 0.94mm/px · 4 of 36 slices shown (2 of 2)]
[im 1/36]
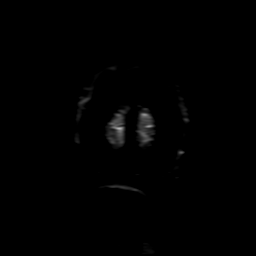
[im 12/36]
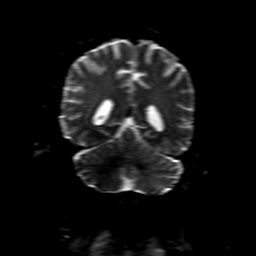
[im 24/36]
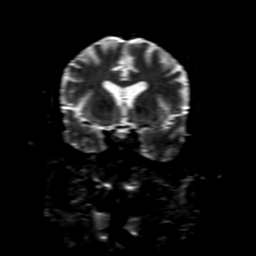
[im 36/36]
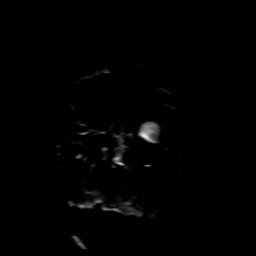

[Series 350: ADC · axial · 3.0mm · 0.94mm/px · z∈[-19,+124]mm · 6 of 50 slices shown]
[im 1/50]
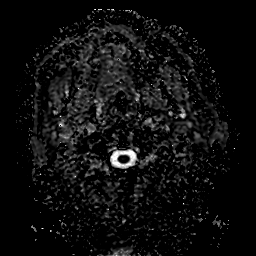
[im 10/50]
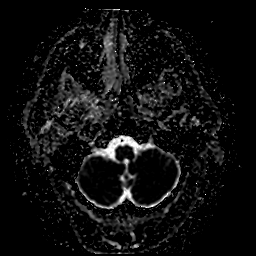
[im 20/50]
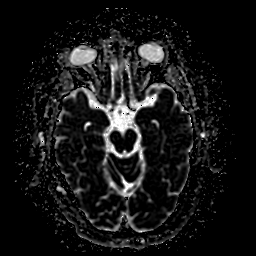
[im 30/50]
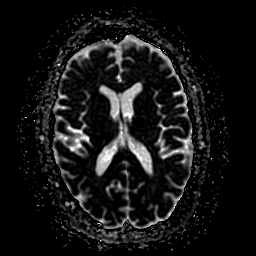
[im 40/50]
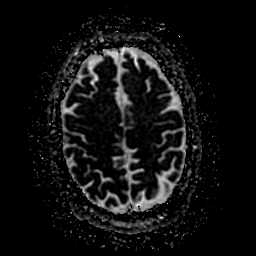
[im 50/50]
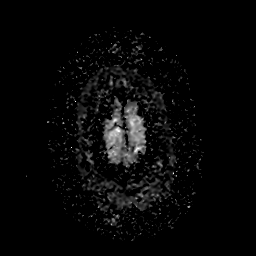

[33 of 48 positions shown; findings below may reference images not displayed]

FINDINGS: MRI HEAD FINDINGS

Acute infarct posterior limb internal capsule on the left. No other
acute infarct identified.

Ventricle size normal.  Cerebral volume normal.

Negative for mass or edema.  No shift of the midline structures.

The patient became claustrophobic and was not able to finish the
study. Multiple sequences not performed. Gradient echo sequence not
performed. Therefore the patient will have a CT scan to follow.

MRA HEAD FINDINGS

Both vertebral arteries patent to the basilar. Right PICA
visualized. Left PICA not visualized. Left AICA patent. Basilar
widely patent. Superior cerebellar and posterior cerebral arteries
patent. Fetal origin left posterior cerebral artery with hypoplastic
left P1 segment.

Cavernous carotid widely patent bilaterally. Anterior and middle
cerebral arteries widely patent bilaterally

Negative for cerebral aneurysm.
IMPRESSION: Acute infarct posterior limb internal capsule on the left. The
patient was not able to complete the MRI. CT scan will be performed.

Negative MRA head

## 2018-04-02 ENCOUNTER — Encounter (HOSPITAL_COMMUNITY): Payer: Self-pay | Admitting: *Deleted

## 2018-04-02 ENCOUNTER — Emergency Department (HOSPITAL_COMMUNITY): Payer: Medicaid Other

## 2018-04-02 ENCOUNTER — Inpatient Hospital Stay (HOSPITAL_COMMUNITY)
Admission: EM | Disposition: A | Discharge status: Home / Self Care | Payer: Self-pay | Source: Home / Self Care | Attending: Cardiovascular Disease

## 2018-04-02 ENCOUNTER — Inpatient Hospital Stay (HOSPITAL_COMMUNITY)
Admission: EM | Admit: 2018-04-02 | Discharge: 2018-04-05 | DRG: 246 | Disposition: A | Discharge status: Home / Self Care | Payer: Medicaid Other | Attending: Cardiovascular Disease | Admitting: Cardiovascular Disease

## 2018-04-02 ENCOUNTER — Inpatient Hospital Stay (HOSPITAL_COMMUNITY): Payer: Medicaid Other

## 2018-04-02 DIAGNOSIS — I25709 Atherosclerosis of coronary artery bypass graft(s), unspecified, with unspecified angina pectoris: Secondary | ICD-10-CM | POA: Diagnosis present

## 2018-04-02 DIAGNOSIS — E785 Hyperlipidemia, unspecified: Secondary | ICD-10-CM | POA: Diagnosis not present

## 2018-04-02 DIAGNOSIS — R079 Chest pain, unspecified: Secondary | ICD-10-CM | POA: Diagnosis present

## 2018-04-02 DIAGNOSIS — I340 Nonrheumatic mitral (valve) insufficiency: Secondary | ICD-10-CM

## 2018-04-02 DIAGNOSIS — I472 Ventricular tachycardia, unspecified: Secondary | ICD-10-CM

## 2018-04-02 DIAGNOSIS — Z9089 Acquired absence of other organs: Secondary | ICD-10-CM | POA: Diagnosis not present

## 2018-04-02 DIAGNOSIS — I255 Ischemic cardiomyopathy: Secondary | ICD-10-CM | POA: Diagnosis not present

## 2018-04-02 DIAGNOSIS — I25810 Atherosclerosis of coronary artery bypass graft(s) without angina pectoris: Secondary | ICD-10-CM | POA: Diagnosis not present

## 2018-04-02 DIAGNOSIS — Z955 Presence of coronary angioplasty implant and graft: Secondary | ICD-10-CM

## 2018-04-02 DIAGNOSIS — F17200 Nicotine dependence, unspecified, uncomplicated: Secondary | ICD-10-CM | POA: Diagnosis present

## 2018-04-02 DIAGNOSIS — Z720 Tobacco use: Secondary | ICD-10-CM | POA: Diagnosis not present

## 2018-04-02 DIAGNOSIS — I959 Hypotension, unspecified: Secondary | ICD-10-CM | POA: Diagnosis present

## 2018-04-02 DIAGNOSIS — I2129 ST elevation (STEMI) myocardial infarction involving other sites: Secondary | ICD-10-CM | POA: Diagnosis not present

## 2018-04-02 DIAGNOSIS — I69398 Other sequelae of cerebral infarction: Secondary | ICD-10-CM | POA: Diagnosis not present

## 2018-04-02 DIAGNOSIS — M069 Rheumatoid arthritis, unspecified: Secondary | ICD-10-CM | POA: Diagnosis present

## 2018-04-02 DIAGNOSIS — I69392 Facial weakness following cerebral infarction: Secondary | ICD-10-CM | POA: Diagnosis not present

## 2018-04-02 DIAGNOSIS — R112 Nausea with vomiting, unspecified: Secondary | ICD-10-CM | POA: Diagnosis present

## 2018-04-02 DIAGNOSIS — I10 Essential (primary) hypertension: Secondary | ICD-10-CM | POA: Diagnosis present

## 2018-04-02 DIAGNOSIS — Z7982 Long term (current) use of aspirin: Secondary | ICD-10-CM

## 2018-04-02 DIAGNOSIS — E669 Obesity, unspecified: Secondary | ICD-10-CM | POA: Diagnosis present

## 2018-04-02 DIAGNOSIS — Z880 Allergy status to penicillin: Secondary | ICD-10-CM

## 2018-04-02 DIAGNOSIS — T82867A Thrombosis of cardiac prosthetic devices, implants and grafts, initial encounter: Principal | ICD-10-CM | POA: Diagnosis present

## 2018-04-02 DIAGNOSIS — E7800 Pure hypercholesterolemia, unspecified: Secondary | ICD-10-CM | POA: Diagnosis present

## 2018-04-02 DIAGNOSIS — E118 Type 2 diabetes mellitus with unspecified complications: Secondary | ICD-10-CM | POA: Diagnosis present

## 2018-04-02 DIAGNOSIS — Z794 Long term (current) use of insulin: Secondary | ICD-10-CM | POA: Diagnosis not present

## 2018-04-02 DIAGNOSIS — I4729 Other ventricular tachycardia: Secondary | ICD-10-CM

## 2018-04-02 DIAGNOSIS — I213 ST elevation (STEMI) myocardial infarction of unspecified site: Secondary | ICD-10-CM

## 2018-04-02 DIAGNOSIS — Z885 Allergy status to narcotic agent status: Secondary | ICD-10-CM | POA: Diagnosis not present

## 2018-04-02 DIAGNOSIS — I4901 Ventricular fibrillation: Secondary | ICD-10-CM | POA: Diagnosis present

## 2018-04-02 DIAGNOSIS — Z6831 Body mass index (BMI) 31.0-31.9, adult: Secondary | ICD-10-CM | POA: Diagnosis not present

## 2018-04-02 DIAGNOSIS — I249 Acute ischemic heart disease, unspecified: Secondary | ICD-10-CM

## 2018-04-02 DIAGNOSIS — Z951 Presence of aortocoronary bypass graft: Secondary | ICD-10-CM

## 2018-04-02 HISTORY — DX: Type 2 diabetes mellitus with unspecified complications: E11.8

## 2018-04-02 HISTORY — DX: Atherosclerotic heart disease of native coronary artery without angina pectoris: I25.10

## 2018-04-02 HISTORY — DX: Atherosclerosis of coronary artery bypass graft(s), unspecified, with unspecified angina pectoris: I25.709

## 2018-04-02 HISTORY — PX: CORONARY/GRAFT ACUTE MI REVASCULARIZATION: CATH118305

## 2018-04-02 HISTORY — DX: Hyperlipidemia, unspecified: E78.5

## 2018-04-02 HISTORY — PX: LEFT HEART CATH AND CORS/GRAFTS ANGIOGRAPHY: CATH118250

## 2018-04-02 HISTORY — DX: Type 2 diabetes mellitus with other specified complication: E11.69

## 2018-04-02 HISTORY — DX: Tobacco use: Z72.0

## 2018-04-02 LAB — BASIC METABOLIC PANEL
ANION GAP: 14 (ref 5–15)
BUN: 14 mg/dL (ref 6–20)
CO2: 26 mmol/L (ref 22–32)
Calcium: 8.5 mg/dL — ABNORMAL LOW (ref 8.9–10.3)
Chloride: 96 mmol/L — ABNORMAL LOW (ref 101–111)
Creatinine, Ser: 0.94 mg/dL (ref 0.61–1.24)
GFR calc Af Amer: >60 mL/min (ref >60)
Glucose, Bld: 283 mg/dL — ABNORMAL HIGH (ref 65–99)
POTASSIUM: 3.5 mmol/L (ref 3.5–5.1)
SODIUM: 136 mmol/L (ref 135–145)

## 2018-04-02 LAB — CBC WITH DIFFERENTIAL/PLATELET
BASOS ABS: 0 10*3/uL (ref 0.0–0.1)
Basophils Relative: 0 %
EOS PCT: 3 %
Eosinophils Absolute: 0.4 10*3/uL (ref 0.0–0.7)
HEMATOCRIT: 51 % (ref 39.0–52.0)
HEMOGLOBIN: 18 g/dL — AB (ref 13.0–17.0)
Lymphocytes Relative: 49 %
Lymphs Abs: 6.9 10*3/uL — ABNORMAL HIGH (ref 0.7–4.0)
MCH: 31.2 pg (ref 26.0–34.0)
MCHC: 35.3 g/dL (ref 30.0–36.0)
MCV: 88.4 fL (ref 78.0–100.0)
MONOS PCT: 7 %
Monocytes Absolute: 1 10*3/uL (ref 0.1–1.0)
NEUTROS ABS: 5.8 10*3/uL (ref 1.7–7.7)
Neutrophils Relative %: 41 %
Platelets: 170 10*3/uL (ref 150–400)
RBC: 5.77 MIL/uL (ref 4.22–5.81)
RDW: 13.3 % (ref 11.5–15.5)
WBC: 14.1 10*3/uL — AB (ref 4.0–10.5)

## 2018-04-02 LAB — I-STAT TROPONIN, ED: Troponin i, poc: 0.07 ng/mL (ref 0.00–0.08)

## 2018-04-02 LAB — GLUCOSE, CAPILLARY
GLUCOSE-CAPILLARY: 250 mg/dL — AB (ref 65–99)
GLUCOSE-CAPILLARY: 209 mg/dL — AB (ref 65–99)
Glucose-Capillary: 245 mg/dL — ABNORMAL HIGH (ref 65–99)
Glucose-Capillary: 191 mg/dL — ABNORMAL HIGH (ref 65–99)

## 2018-04-02 LAB — COMPREHENSIVE METABOLIC PANEL
ALBUMIN: 3.9 g/dL (ref 3.5–5.0)
ALT: 29 U/L (ref 17–63)
ANION GAP: 15 (ref 5–15)
AST: 25 U/L (ref 15–41)
Alkaline Phosphatase: 73 U/L (ref 38–126)
BILIRUBIN TOTAL: 1 mg/dL (ref 0.3–1.2)
BUN: 15 mg/dL (ref 6–20)
CHLORIDE: 99 mmol/L — AB (ref 101–111)
CO2: 22 mmol/L (ref 22–32)
Calcium: 9.4 mg/dL (ref 8.9–10.3)
Creatinine, Ser: 0.92 mg/dL (ref 0.61–1.24)
GFR calc Af Amer: >60 mL/min (ref >60)
GFR calc non Af Amer: >60 mL/min (ref >60)
GLUCOSE: 222 mg/dL — AB (ref 65–99)
POTASSIUM: 3.8 mmol/L (ref 3.5–5.1)
Sodium: 136 mmol/L (ref 135–145)
TOTAL PROTEIN: 7.6 g/dL (ref 6.5–8.1)

## 2018-04-02 LAB — CBC
HCT: 45.6 % (ref 39.0–52.0)
Hemoglobin: 15.9 g/dL (ref 13.0–17.0)
MCH: 30.7 pg (ref 26.0–34.0)
MCHC: 34.9 g/dL (ref 30.0–36.0)
MCV: 88 fL (ref 78.0–100.0)
Platelets: 192 10*3/uL (ref 150–400)
RBC: 5.18 MIL/uL (ref 4.22–5.81)
RDW: 13.3 % (ref 11.5–15.5)
WBC: 15.6 10*3/uL — AB (ref 4.0–10.5)

## 2018-04-02 LAB — HEMOGLOBIN A1C
Hgb A1c MFr Bld: 7.7 % — ABNORMAL HIGH (ref 4.8–5.6)
Mean Plasma Glucose: 174.29 mg/dL

## 2018-04-02 LAB — I-STAT CHEM 8, ED
BUN: 18 mg/dL (ref 6–20)
CHLORIDE: 100 mmol/L — AB (ref 101–111)
Calcium, Ion: 1.08 mmol/L — ABNORMAL LOW (ref 1.15–1.40)
Creatinine, Ser: 0.7 mg/dL (ref 0.61–1.24)
Glucose, Bld: 220 mg/dL — ABNORMAL HIGH (ref 65–99)
HCT: 53 % — ABNORMAL HIGH (ref 39.0–52.0)
Hemoglobin: 18 g/dL — ABNORMAL HIGH (ref 13.0–17.0)
Potassium: 3.8 mmol/L (ref 3.5–5.1)
SODIUM: 138 mmol/L (ref 135–145)
TCO2: 25 mmol/L (ref 22–32)

## 2018-04-02 LAB — CARDIAC CATHETERIZATION: Cath EF Quantitative: 30 %

## 2018-04-02 LAB — MAGNESIUM: Magnesium: 1.6 mg/dL — ABNORMAL LOW (ref 1.7–2.4)

## 2018-04-02 LAB — TROPONIN I
TROPONIN I: 48.22 ng/mL — AB (ref <0.03)
Troponin I: 17.74 ng/mL (ref <0.03)
Troponin I: 48.73 ng/mL (ref <0.03)

## 2018-04-02 LAB — PROTIME-INR
INR: 0.95
PROTHROMBIN TIME: 12.5 s (ref 11.4–15.2)

## 2018-04-02 LAB — MRSA PCR SCREENING: MRSA BY PCR: NEGATIVE

## 2018-04-02 LAB — APTT: APTT: 28 s (ref 24–36)

## 2018-04-02 LAB — POCT ACTIVATED CLOTTING TIME: Activated Clotting Time: 158 seconds

## 2018-04-02 SURGERY — CORONARY/GRAFT ACUTE MI REVASCULARIZATION
Anesthesia: LOCAL

## 2018-04-02 MED ORDER — ACETAMINOPHEN 325 MG PO TABS: 650.0000 mg | ORAL_TABLET | ORAL | Status: DC | PRN

## 2018-04-02 MED ORDER — ASPIRIN 81 MG PO CHEW
CHEWABLE_TABLET | ORAL | Status: AC
  Filled 2018-04-02: qty 4

## 2018-04-02 MED ORDER — HYDRALAZINE HCL 20 MG/ML IJ SOLN: 5.0000 mg | INTRAMUSCULAR | Status: AC | PRN

## 2018-04-02 MED ORDER — TIROFIBAN (AGGRASTAT) BOLUS VIA INFUSION
INTRAVENOUS | Status: DC | PRN
  Administered 2018-04-02: 2750 ug via INTRAVENOUS

## 2018-04-02 MED ORDER — SODIUM CHLORIDE 0.9 % IV SOLN
INTRAVENOUS | Status: AC
  Administered 2018-04-02: 75 mL/h via INTRAVENOUS

## 2018-04-02 MED ORDER — BIVALIRUDIN TRIFLUOROACETATE 250 MG IV SOLR
INTRAVENOUS | Status: AC
  Filled 2018-04-02: qty 250

## 2018-04-02 MED ORDER — TICAGRELOR 90 MG PO TABS
90.0000 mg | ORAL_TABLET | Freq: Two times a day (BID) | ORAL | Status: DC
  Administered 2018-04-02 – 2018-04-05 (×7): 90 mg via ORAL
  Filled 2018-04-02 (×7): qty 1

## 2018-04-02 MED ORDER — SODIUM CHLORIDE 0.9% FLUSH
3.0000 mL | Freq: Two times a day (BID) | INTRAVENOUS | Status: DC
  Administered 2018-04-03 – 2018-04-05 (×3): 3 mL via INTRAVENOUS

## 2018-04-02 MED ORDER — AMIODARONE HCL 150 MG/3ML IV SOLN
INTRAVENOUS | Status: AC | PRN
  Administered 2018-04-02: 300 mg via INTRAVENOUS

## 2018-04-02 MED ORDER — TICAGRELOR 90 MG PO TABS
ORAL_TABLET | ORAL | Status: AC
  Filled 2018-04-02: qty 1

## 2018-04-02 MED ORDER — AMIODARONE HCL IN DEXTROSE 360-4.14 MG/200ML-% IV SOLN
60.0000 mg/h | Freq: Once | INTRAVENOUS | Status: AC
  Administered 2018-04-02: 60 mg/h via INTRAVENOUS

## 2018-04-02 MED ORDER — ATORVASTATIN CALCIUM 20 MG PO TABS: 20.0000 mg | ORAL_TABLET | Freq: Every day | ORAL | Status: DC

## 2018-04-02 MED ORDER — TIROFIBAN HCL IN NACL 5-0.9 MG/100ML-% IV SOLN
INTRAVENOUS | Status: AC | PRN
  Administered 2018-04-02: 0.15 ug/kg/min via INTRAVENOUS

## 2018-04-02 MED ORDER — MORPHINE SULFATE (PF) 2 MG/ML IV SOLN
2.0000 mg | Freq: Once | INTRAVENOUS | Status: AC
  Administered 2018-04-03: 2 mg via INTRAVENOUS
  Filled 2018-04-02: qty 1

## 2018-04-02 MED ORDER — MAGNESIUM HYDROXIDE 400 MG/5ML PO SUSP
30.0000 mL | Freq: Once | ORAL | Status: AC
  Administered 2018-04-02: 30 mL via ORAL
  Filled 2018-04-02: qty 30

## 2018-04-02 MED ORDER — ONDANSETRON HCL 4 MG/2ML IJ SOLN
4.0000 mg | Freq: Once | INTRAMUSCULAR | Status: AC
  Administered 2018-04-02: 4 mg via INTRAVENOUS

## 2018-04-02 MED ORDER — INSULIN ASPART 100 UNIT/ML ~~LOC~~ SOLN
0.0000 [IU] | Freq: Three times a day (TID) | SUBCUTANEOUS | Status: DC
  Administered 2018-04-02: 3 [IU] via SUBCUTANEOUS
  Administered 2018-04-02 – 2018-04-05 (×4): 5 [IU] via SUBCUTANEOUS

## 2018-04-02 MED ORDER — SODIUM CHLORIDE 0.9 % IV SOLN
INTRAVENOUS | Status: AC | PRN
  Administered 2018-04-02 (×2): 1.75 mg/kg/h via INTRAVENOUS

## 2018-04-02 MED ORDER — ASPIRIN 81 MG PO CHEW
81.0000 mg | CHEWABLE_TABLET | Freq: Every day | ORAL | Status: DC
  Administered 2018-04-02 – 2018-04-05 (×4): 81 mg via ORAL
  Filled 2018-04-02 (×4): qty 1

## 2018-04-02 MED ORDER — SODIUM CHLORIDE 0.9 % IV SOLN: 250.0000 mL | INTRAVENOUS | Status: DC | PRN

## 2018-04-02 MED ORDER — ONDANSETRON HCL 4 MG/2ML IJ SOLN
INTRAMUSCULAR | Status: AC
  Filled 2018-04-02: qty 2

## 2018-04-02 MED ORDER — HEPARIN (PORCINE) IN NACL 100-0.45 UNIT/ML-% IJ SOLN
1200.0000 [IU]/h | INTRAMUSCULAR | Status: DC
  Administered 2018-04-02: 1200 [IU]/h via INTRAVENOUS
  Filled 2018-04-02: qty 250

## 2018-04-02 MED ORDER — SODIUM CHLORIDE 0.9 % IV SOLN: 1.7500 mg/kg/h | INTRAVENOUS | Status: DC

## 2018-04-02 MED ORDER — TICAGRELOR 90 MG PO TABS
ORAL_TABLET | ORAL | Status: DC | PRN
  Administered 2018-04-02: 180 mg via ORAL

## 2018-04-02 MED ORDER — ALUM & MAG HYDROXIDE-SIMETH 200-200-20 MG/5ML PO SUSP
30.0000 mL | Freq: Once | ORAL | Status: AC
  Administered 2018-04-02: 30 mL via ORAL
  Filled 2018-04-02: qty 30

## 2018-04-02 MED ORDER — AMIODARONE HCL IN DEXTROSE 360-4.14 MG/200ML-% IV SOLN
60.0000 mg/h | INTRAVENOUS | Status: AC
  Filled 2018-04-02: qty 200

## 2018-04-02 MED ORDER — MORPHINE SULFATE (PF) 2 MG/ML IV SOLN
2.0000 mg | INTRAVENOUS | Status: DC | PRN
  Administered 2018-04-02 (×4): 2 mg via INTRAVENOUS
  Filled 2018-04-02 (×4): qty 1

## 2018-04-02 MED ORDER — BIVALIRUDIN BOLUS VIA INFUSION - CUPID
INTRAVENOUS | Status: DC | PRN
  Administered 2018-04-02: 82.5 mg via INTRAVENOUS

## 2018-04-02 MED ORDER — HEPARIN (PORCINE) IN NACL 2-0.9 UNITS/ML
INTRAMUSCULAR | Status: AC | PRN
  Administered 2018-04-02 (×2): 500 mL

## 2018-04-02 MED ORDER — INSULIN ASPART 100 UNIT/ML ~~LOC~~ SOLN
0.0000 [IU] | Freq: Every day | SUBCUTANEOUS | Status: DC
  Administered 2018-04-02 – 2018-04-04 (×2): 2 [IU] via SUBCUTANEOUS

## 2018-04-02 MED ORDER — FUROSEMIDE 10 MG/ML IJ SOLN
INTRAMUSCULAR | Status: AC
  Filled 2018-04-02: qty 4

## 2018-04-02 MED ORDER — TIROFIBAN HCL IN NACL 5-0.9 MG/100ML-% IV SOLN
INTRAVENOUS | Status: AC
  Filled 2018-04-02: qty 100

## 2018-04-02 MED ORDER — SODIUM CHLORIDE 0.9 % IV SOLN
INTRAVENOUS | Status: AC | PRN
  Administered 2018-04-02: 50 mL/h via INTRAVENOUS

## 2018-04-02 MED ORDER — INSULIN GLARGINE 100 UNIT/ML ~~LOC~~ SOLN
20.0000 [IU] | Freq: Every day | SUBCUTANEOUS | Status: DC
  Administered 2018-04-02: 20 [IU] via SUBCUTANEOUS
  Filled 2018-04-02 (×3): qty 0.2

## 2018-04-02 MED ORDER — AMIODARONE HCL IN DEXTROSE 360-4.14 MG/200ML-% IV SOLN
INTRAVENOUS | Status: AC
  Filled 2018-04-02: qty 200

## 2018-04-02 MED ORDER — LABETALOL HCL 5 MG/ML IV SOLN: 10.0000 mg | INTRAVENOUS | Status: AC | PRN

## 2018-04-02 MED ORDER — ONDANSETRON HCL 4 MG/2ML IJ SOLN
INTRAMUSCULAR | Status: AC
  Administered 2018-04-02: 4 mg
  Filled 2018-04-02: qty 2

## 2018-04-02 MED ORDER — LIDOCAINE HCL (PF) 1 % IJ SOLN
INTRAMUSCULAR | Status: DC | PRN
  Administered 2018-04-02: 25 mL

## 2018-04-02 MED ORDER — SODIUM CHLORIDE 0.9 % IV SOLN
1.7500 mg/kg/h | INTRAVENOUS | Status: AC
  Administered 2018-04-02 (×2): 1.75 mg/kg/h via INTRAVENOUS
  Filled 2018-04-02: qty 250

## 2018-04-02 MED ORDER — SODIUM CHLORIDE 0.9 % IV BOLUS
500.0000 mL | Freq: Once | INTRAVENOUS | Status: AC
  Administered 2018-04-02: 500 mL via INTRAVENOUS

## 2018-04-02 MED ORDER — HEPARIN BOLUS VIA INFUSION
4000.0000 [IU] | Freq: Once | INTRAVENOUS | Status: AC
  Administered 2018-04-02: 4000 [IU] via INTRAVENOUS
  Filled 2018-04-02: qty 4000

## 2018-04-02 MED ORDER — LIDOCAINE HCL (PF) 1 % IJ SOLN
INTRAMUSCULAR | Status: AC
  Filled 2018-04-02: qty 30

## 2018-04-02 MED ORDER — ONDANSETRON HCL 4 MG/2ML IJ SOLN
4.0000 mg | Freq: Four times a day (QID) | INTRAMUSCULAR | Status: DC | PRN
Start: 2018-04-02 — End: 2018-04-05
  Administered 2018-04-02 (×2): 4 mg via INTRAVENOUS
  Filled 2018-04-02 (×3): qty 2

## 2018-04-02 MED ORDER — PROCHLORPERAZINE EDISYLATE 10 MG/2ML IJ SOLN
5.0000 mg | Freq: Four times a day (QID) | INTRAMUSCULAR | Status: DC | PRN
  Filled 2018-04-02: qty 1

## 2018-04-02 MED ORDER — METOCLOPRAMIDE HCL 5 MG/ML IJ SOLN: 10.0000 mg | Freq: Once | INTRAMUSCULAR | Status: DC

## 2018-04-02 MED ORDER — ASPIRIN 81 MG PO CHEW
CHEWABLE_TABLET | ORAL | Status: DC | PRN
  Administered 2018-04-02: 324 mg via ORAL

## 2018-04-02 MED ORDER — FUROSEMIDE 10 MG/ML IJ SOLN
INTRAMUSCULAR | Status: DC | PRN
  Administered 2018-04-02: 80 mg via INTRAVENOUS

## 2018-04-02 MED ORDER — NITROGLYCERIN 1 MG/10 ML FOR IR/CATH LAB
INTRA_ARTERIAL | Status: AC
  Filled 2018-04-02: qty 10

## 2018-04-02 MED ORDER — MORPHINE SULFATE (PF) 10 MG/ML IV SOLN: 2.0000 mg | INTRAVENOUS | Status: DC | PRN

## 2018-04-02 MED ORDER — TIROFIBAN HCL IN NACL 5-0.9 MG/100ML-% IV SOLN
0.1500 ug/kg/min | INTRAVENOUS | Status: AC
  Administered 2018-04-02 (×4): 0.15 ug/kg/min via INTRAVENOUS
  Filled 2018-04-02 (×3): qty 100

## 2018-04-02 MED ORDER — SODIUM CHLORIDE 0.9% FLUSH: 3.0000 mL | INTRAVENOUS | Status: DC | PRN

## 2018-04-02 MED ORDER — HEPARIN (PORCINE) IN NACL 1000-0.9 UT/500ML-% IV SOLN
INTRAVENOUS | Status: AC
  Filled 2018-04-02: qty 1000

## 2018-04-02 MED ORDER — AMIODARONE HCL IN DEXTROSE 360-4.14 MG/200ML-% IV SOLN
30.0000 mg/h | INTRAVENOUS | Status: DC
  Administered 2018-04-02 – 2018-04-03 (×2): 30 mg/h via INTRAVENOUS
  Filled 2018-04-02 (×2): qty 200

## 2018-04-02 MED ORDER — IOHEXOL 350 MG/ML SOLN
INTRAVENOUS | Status: DC | PRN
  Administered 2018-04-02: 130 mL via INTRA_ARTERIAL

## 2018-04-02 MED ORDER — MAGNESIUM SULFATE 2 GM/50ML IV SOLN
2.0000 g | Freq: Once | INTRAVENOUS | Status: AC
  Administered 2018-04-02: 2 g via INTRAVENOUS
  Filled 2018-04-02: qty 50

## 2018-04-02 SURGICAL SUPPLY — 17 items
BALLN SAPPHIRE 2.0X12 (BALLOONS) ×2
BALLOON SAPPHIRE 2.0X12 (BALLOONS) ×1 IMPLANT
CATH EXTRAC PRONTO LP 6F RND (CATHETERS) ×2 IMPLANT
CATH INFINITI 5FR MULTPACK ANG (CATHETERS) ×2 IMPLANT
CATH VISTA GUIDE 6FR JR4 (CATHETERS) ×2 IMPLANT
DEVICE SPIDERFX EMB PROT 5MM (WIRE) ×2 IMPLANT
HOVERMATT SINGLE USE (MISCELLANEOUS) ×2 IMPLANT
KIT ENCORE 26 ADVANTAGE (KITS) ×2 IMPLANT
KIT HEART LEFT (KITS) ×2 IMPLANT
PACK CARDIAC CATHETERIZATION (CUSTOM PROCEDURE TRAY) ×2 IMPLANT
SHEATH PINNACLE 6F 10CM (SHEATH) ×2 IMPLANT
STENT SYNERGY DES 3.5X20 (Permanent Stent) ×2 IMPLANT
SYR MEDRAD MARK V 150ML (SYRINGE) ×2 IMPLANT
TRANSDUCER W/STOPCOCK (MISCELLANEOUS) ×2 IMPLANT
TUBING CIL FLEX 10 FLL-RA (TUBING) ×2 IMPLANT
WIRE ASAHI PROWATER 180CM (WIRE) ×2 IMPLANT
WIRE EMERALD 3MM-J .035X150CM (WIRE) ×2 IMPLANT

## 2018-04-02 NOTE — Consult Note (Signed)
Cardiology Consult   Patient ID: Matthew Moses, MRN: [id number], DOB: 01/01/1951   Date of Encounter: 04/02/2018, 2:22 AM  Primary Care Provider: System, Provider Not In Cardiologist: NA  Chief Complaint:  CP  History of Present Illness: Matthew Moses is a 67 y.o. male with h/o CAD, s/p CABG, HTN, dyslipidemia presented to the ED tonight c/o CP that started earlier this evening. He says it was a midsternal discomfort that started suddenly at rest earlier tonight. It was associated with SOB; he called EMS, and EMS gave him albuterol MDI as well as NTG. Upon arrival to the ED, EMS was coding pt for [facility] and pulselessness. ED physician says the monitor showed appearance c/w possible Torsades, and pt was shocked w/ restoration of SB. He had a recurrence of WCT subsequent to that episode that then resolved spontaneously.  When I arrived to see pt, he was ill-appearing and on NRB. He still described 8/10 CP at that time. He was HD stable.   Past Medical History:  Diagnosis Date  . Coronary artery disease   . High cholesterol   . Hx of CABG   . Hypertension     Past Surgical History:  Procedure Laterality Date  . CORONARY ARTERY BYPASS GRAFT    . KNEE SURGERY    . MUSCLE REPAIR    . TONSILLECTOMY       Prior to Admission medications   Medication Sig Start Date End Date Taking? Authorizing Provider  aspirin EC 325 MG EC tablet Take 1 tablet (325 mg total) by mouth daily. 07/05/16   Donzetta Starch, NP  atorvastatin (LIPITOR) 20 MG tablet Take 1 tablet (20 mg total) by mouth daily at 6 PM. 07/05/16   Donzetta Starch, NP  blood glucose meter kit and supplies Dispense based on patient and insurance preference. Use up to four times daily as directed. (FOR ICD-9 250.00, 250.01). 07/05/16   Donzetta Starch, NP  insulin glargine (LANTUS) 100 UNIT/ML injection Inject 0.2 mLs (20 Units total) into the skin at bedtime. 07/05/16   Donzetta Starch, NP  Lancets (ONETOUCH ULTRASOFT) lancets Use as instructed  07/05/16   Donzetta Starch, NP  naproxen (NAPROSYN) 500 MG tablet Take 1 tablet (500 mg total) by mouth 2 (two) times daily. 04/29/16   Tanna Furry, MD  promethazine (PHENERGAN) 25 MG tablet Take 1 tablet (25 mg total) by mouth every 6 (six) hours as needed for nausea or vomiting. 04/29/16   Tanna Furry, MD     Allergies: Allergies  Allergen Reactions  . Penicillins Itching and Rash    Social History:  The patient  reports that he has been smoking.  He has never used smokeless tobacco. He reports that he has current or past drug history. Drug: Marijuana. He reports that he does not drink alcohol.   Family History:  The patient's family history is not on file.  FHx non-contributory  ROS:  Please see the history of present illness.     All other systems reviewed and negative.   Vital Signs: Blood pressure 112/65, pulse 96, resp. rate (!) 43, SpO2 90 %.  PHYSICAL EXAM: General:  Well nourished, well developed, in moderate distress. diaphoretic HEENT: normal Lymph: no adenopathy Neck: no JVD Endocrine:  No thryomegaly Vascular: No carotid bruits; DP pulses 2+ bilaterally  Cardiac:  normal S1, S2; RRR; no murmur  Lungs:  clear to auscultation bilaterally, no wheezing, rhonchi or rales  Abd: soft, nontender, no hepatomegaly  Ext: no edema  Musculoskeletal:  No deformities, BUE and BLE strength normal and equal Skin: warm and dry  Neuro:  CNs 2-12 intact, no focal abnormalities noted Psych:  Normal affect   EKG:  04-02-18 NSR with ST elevation in aVR and V1, diffuse ST depression in other leads  Labs:   Lab Results  Component Value Date   WBC 9.4 07/05/2016   HGB 18.0 (H) 04/02/2018   HCT 53.0 (H) 04/02/2018   MCV 89.2 07/05/2016   PLT 162 07/05/2016   Recent Labs  Lab 04/02/18 0158  NA 138  K 3.8  CL 100*  BUN 18  CREATININE 0.70  GLUCOSE 220*   No results for input(s): CKTOTAL, CKMB, TROPONINI in the last 72 hours. Troponin (Point of Care Test) Recent Labs     04/02/18 0201  TROPIPOC 0.07    Lab Results  Component Value Date   CHOL 159 07/03/2016   HDL 24 (L) 07/03/2016   LDLCALC 98 07/03/2016   TRIG 183 (H) 07/03/2016   No results found for: DDIMER  Radiology/Studies:  Dg Chest Portable 1 View  Result Date: 04/02/2018 CLINICAL DATA:  Shortness of breath, chest pain. Extensive cardiac history. EXAM: PORTABLE CHEST 1 VIEW COMPARISON:  None. FINDINGS: Cardiomediastinal silhouette is normal. Mildly calcified aortic arch. Status post median sternotomy. Interstitial prominence without pleural effusion or focal consolidation. No pneumothorax. Soft tissue planes and included osseous structures are nonsuspicious. IMPRESSION: Mild interstitial prominence seen with pulmonary edema or atypical infection, potentially chronic. Aortic Atherosclerosis (ICD10-I70.0). Electronically Signed   By: Elon Alas M.D.   On: 04/02/2018 02:35      ASSESSMENT AND PLAN:   1. CAD/STEMI/arrest: s/p pulseless VT and DCCV back to NSR. Baseline EKG shows acute ST elevation in aVR and V1 w/ reciprocal ST depression in pt w/ h/o significant CAD, s/p CABG. Details regarding his coronary anatomy and CABG are not known. Will take urgently to cath lab for further evalution  2. Dyslipidemia: LDL 98; needs high-intensity statin therapy  3. HTN: BP stable at this point. Will follow   Thank you for the opportunity to participate in the care of this patient. Will follow. Please call w/ questions.   Rudean Curt, MD , [facility]  04/02/2018   2:22 AM

## 2018-04-02 NOTE — Progress Notes (Signed)
Progress Note  Patient Name: Matthew Moses Date of Encounter: 04/02/2018  Primary Cardiologist: No primary care provider on file.   Subjective   67 year old gentleman with a history of coronary artery disease status status post coronary artery bypass grafting, hypertension, dyslipidemia presented to the emergency room early this morning with substernal chest pain.  He had wide-complex tachycardia and pulselessness.  He was shocked with restoration of sinus rhythm.  Recurrent wide-complex tachycardia that spontaneously resolved.  Presenting EKG revealed sinus bradycardia at 49 beats a minute with ST segment elevation in lead aVR in lead V1.  He had ST segment depression in the other leads.  Patient was taken emergently to heart catheterization.  He is found to have a thrombus in the saphenous vein graft to first obtuse marginal.  The thrombus was aspirated and a stent was successfully placed.  Troponin this morning is 17.74.  Still having some pain but this is a last night.  He is having some nausea. His right femoral artery sheath is oozing.  ACT is 154.  The angina max is been off for 2 hours.  We will discontinue the sheath.  Inpatient Medications    Scheduled Meds: . aspirin  81 mg Oral Daily  . atorvastatin  20 mg Oral q1800  . insulin glargine  20 Units Subcutaneous QHS  . metoCLOPramide (REGLAN) injection  10 mg Intravenous Once  . ondansetron      . ondansetron      . ondansetron      . sodium chloride flush  3 mL Intravenous Q12H  . ticagrelor  90 mg Oral BID   Continuous Infusions: . sodium chloride 75 mL/hr at 04/02/18 0800  . sodium chloride    . amiodarone    . amiodarone 30 mg/hr (04/02/18 0800)  . tirofiban 0.15 mcg/kg/min (04/02/18 0840)   PRN Meds: sodium chloride, acetaminophen, hydrALAZINE, labetalol, morphine injection, ondansetron (ZOFRAN) IV, sodium chloride flush   Vital Signs    Vitals:   04/02/18 0600 04/02/18 0700 04/02/18 0800 04/02/18 0831    BP: 104/61 (!) 98/35 98/67   Pulse: 79 71 71   Resp: (!) 25 (!) 26 (!) 23   Temp:    98 F (36.7 C)  TempSrc:    Oral  SpO2: 99% 100% 100%   Weight:      Height:        Intake/Output Summary (Last 24 hours) at 04/02/2018 0901 Last data filed at 04/02/2018 0800 Gross per 24 hour  Intake 897.62 ml  Output 950 ml  Net -52.38 ml   Filed Weights   04/02/18 0329 04/02/18 0430  Weight: 241 lb (109.3 kg) 235 lb 10.8 oz (106.9 kg)    Telemetry    NSR  - Personally Reviewed  ECG     NSR  - Personally Reviewed  Physical Exam   GEN:  Elderly gentleman.  He does not appear to be comfortable. Neck: No JVD Cardiac: RRR, no murmurs, rubs, or gallops.  Respiratory: Clear to auscultation bilaterally. GI: Soft, nontender, non-distended  MS: No edema, right femoral sheath is oozing.  There is no significant hematoma. Neuro:  Nonfocal  Psych: Normal affect   Labs    Chemistry Recent Labs  Lab 04/02/18 0152 04/02/18 0158 04/02/18 0543  NA 136 138 136  K 3.8 3.8 3.5  CL 99* 100* 96*  CO2 22  --  26  GLUCOSE 222* 220* 283*  BUN 15 18 14   CREATININE 0.92 0.70 0.94  CALCIUM 9.4  --  8.5*  PROT 7.6  --   --   ALBUMIN 3.9  --   --   AST 25  --   --   ALT 29  --   --   ALKPHOS 73  --   --   BILITOT 1.0  --   --   GFRNONAA >60  --  >60  GFRAA >60  --  >60  ANIONGAP 15  --  14     Hematology Recent Labs  Lab 04/02/18 0152 04/02/18 0158 04/02/18 0543  WBC 14.1*  --  15.6*  RBC 5.77  --  5.18  HGB 18.0* 18.0* 15.9  HCT 51.0 53.0* 45.6  MCV 88.4  --  88.0  MCH 31.2  --  30.7  MCHC 35.3  --  34.9  RDW 13.3  --  13.3  PLT 170  --  192    Cardiac Enzymes Recent Labs  Lab 04/02/18 0543  TROPONINI 17.74*    Recent Labs  Lab 04/02/18 0201  TROPIPOC 0.07     BNPNo results for input(s): BNP, PROBNP in the last 168 hours.   DDimer No results for input(s): DDIMER in the last 168 hours.   Radiology    Dg Chest Portable 1 View  Result Date:  04/02/2018 CLINICAL DATA:  Shortness of breath, chest pain. Extensive cardiac history. EXAM: PORTABLE CHEST 1 VIEW COMPARISON:  None. FINDINGS: Cardiomediastinal silhouette is normal. Mildly calcified aortic arch. Status post median sternotomy. Interstitial prominence without pleural effusion or focal consolidation. No pneumothorax. Soft tissue planes and included osseous structures are nonsuspicious. IMPRESSION: Mild interstitial prominence seen with pulmonary edema or atypical infection, potentially chronic. Aortic Atherosclerosis (ICD10-I70.0). Electronically Signed   By: Awilda Metro M.D.   On: 04/02/2018 02:35    Cardiac Studies     Patient Profile     67 year old gentleman with a history of coronary artery disease and coronary artery bypass grafting.  He presented with an acute ST segment elevation myocardial infarction.  Assessment & Plan    1.  Coronary artery disease: The patient is status post coronary artery bypass grafting.  He had acute thrombosis of the graft to the obtuse marginal artery.  He status post stenting of the saphenous vein graft to the OM.  He still having some chest pain but his pain is overall better than last night.  Continue current therapy.  2.  Hyperlipidemia: We will check lipids today.  2.  Diabetes mellitus: He states that his glucose levels have never been well controlled.  Glucose this morning is 283.  We will order a diabetic coordinator consult.  We will keep in the intensive care unit today.  Consider transfer tomorrow.  For questions or updates, please contact [facility] Please consult [email or web address] for contact info under Cardiology/STEMI.      Signed, Kristeen Miss, MD  04/02/2018, 9:01 AM

## 2018-04-02 NOTE — Progress Notes (Signed)
On call MD notified and updated regarding patients complaint of nausea uncontrolled with PRN Zofran. Patient noted to have taken 20mg  Zofran thus far within the past 24 hours. MD consulted with pharmacy and orders obtained to assess EKG for QT/QTC interval. Orders placed for Compazine PRN nausea uncontrolled by Zofran, however MD advises to hold medication for QTC > 500. MD made aware of patient with episode of wide complex tach/Torsades prior to cardiac cath requiring defib. MD made aware of patient with Mag level on admission of 1.6. Order obtained for 2g IV Mag replacement and repeat EKG after infusion. MD also made aware of post cath Troponin > 65. Orders received for serial troponin x 3 to monitor for decreasing trend. Orders received and carried out. Repeat EKG remains with QTC 501. Troponin noted to decrease from > 65 to 48.73. On call MD notified and updated. Patient remains with nausea that's worse with movement/turning in bed. Patient more at ease while resting on his side. Patient with new report of chest pain which he points to his sternum in regards to location. 2-3 out of 10 on numeric pain scale. Patient reports its constant and hurts more with each breath. MD made aware of patients complaint. Advised by MD to administer Morphine PRN report of pain as patient has no EKG changes at this time.

## 2018-04-02 NOTE — Progress Notes (Signed)
Critical Lab Value received Troponin >65 Value expected with recent STEMI. No chest pain. Patient has just been irritated that he has had to remain flat in bed "all" day after his sheath removal.

## 2018-04-02 NOTE — ED Notes (Signed)
Paged Allyson Sabal to [place]

## 2018-04-02 NOTE — Progress Notes (Signed)
Right femoral arterial sheath d/c'd per protocol. 2 RN s present (myself and Bronson Curb).  Pressure held x 30 minutes. Groin Level 1 with a small bruise. 6 hour bedrest starts at 1012. Patient instructed on bedrest and should he need to cough, laugh, vomit, etc. He should hold pressure to groin. If he should feel it getting warm he should hold pressure and call the nurse. Verbalized understanding.

## 2018-04-02 NOTE — Progress Notes (Signed)
  Echocardiogram 2D Echocardiogram has been performed.  Delcie Roch 04/02/2018, 5:59 PM

## 2018-04-02 NOTE — Progress Notes (Signed)
While assessing Rt fem arterial sheath site it was noted to be saturated with oozing to bed pad. Manual pressure held at site with distal pulse check x 10 mins with hemostasis noted. Pt advised against bearing down while voiding. Site left exposed for better viewing.

## 2018-04-02 NOTE — ED Triage Notes (Signed)
Pt. Via EMS with reports of CP that started at midnight. Pt. Diaphoretic with nausea and vomiting. Pt. In a fib rvr with rate of 50-120. Pt. Given 1 nitro, 10 of albuterol, and  0.5 of Atrovent. Pt. Reports the nitro brought his chest pain from a 10 to an 8. Pt. Had same episode 2 weeks ago.

## 2018-04-02 NOTE — ED Notes (Signed)
Activated Stemi @ 150

## 2018-04-02 NOTE — ED Provider Notes (Addendum)
[facility] EMERGENCY DEPARTMENT Provider Note   CSN: [id number] Arrival date & time: 04/02/18  0129  Time seen 1?30 AM   History   Chief Complaint No chief complaint on file. Chest pain   Level 5 caveat for altered mental status  HPI Matthew Moses is a 67 y.o. male.  HPI EMS states they had a call for chest pain.  They report when they got there patient was having chest pain and shortness of breath.  They gave him an albuterol and Atrovent nebulizer treatment and a nitroglycerin.  They report initially he had a heart rate in the 50s and showed me an EKG which had diffuse ST depression septal and laterally.  They report after they gave the medications he went into atrial fibrillation with rapid ventricular response of around 120.    PCP System, Provider Not In   Past Medical History:  Diagnosis Date  . Coronary artery disease   . High cholesterol   . Hx of CABG   . Hypertension     Patient Active Problem List   Diagnosis Date Noted  . Essential hypertension 07/05/2016  . Diabetes type 2, controlled ([place]) 07/05/2016  . Hyperlipidemia LDL goal <70 07/05/2016  . Cigarette smoker 07/05/2016  . Marijuana abuse 07/05/2016  . Obesity 07/05/2016  . Rheumatoid arthritis ([place]) 07/05/2016  . 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 CORONARY ARTERY BYPASS GRAFT    . KNEE SURGERY    . MUSCLE REPAIR    . TONSILLECTOMY          Home Medications    Prior to Admission medications   Medication Sig Start Date End Date Taking? Authorizing Provider  aspirin EC 325 MG EC tablet Take 1 tablet (325 mg total) by mouth daily. 07/05/16   Donzetta Starch, NP  atorvastatin (LIPITOR) 20 MG tablet Take 1 tablet (20 mg total) by mouth daily at 6 PM. 07/05/16   Donzetta Starch, NP  blood glucose meter kit and supplies Dispense based on patient and  insurance preference. Use up to four times daily as directed. (FOR ICD-9 250.00, 250.01). 07/05/16   Donzetta Starch, NP  insulin glargine (LANTUS) 100 UNIT/ML injection Inject 0.2 mLs (20 Units total) into the skin at bedtime. 07/05/16   Donzetta Starch, NP  Lancets (ONETOUCH ULTRASOFT) lancets Use as instructed 07/05/16   Donzetta Starch, NP  naproxen (NAPROSYN) 500 MG tablet Take 1 tablet (500 mg total) by mouth 2 (two) times daily. 04/29/16   Tanna Furry, MD  promethazine (PHENERGAN) 25 MG tablet Take 1 tablet (25 mg total) by mouth every 6 (six) hours as needed for nausea or vomiting. 04/29/16   Tanna Furry, MD    Family History No family history on file.  Social History Social History   Tobacco Use  . Smoking status: Current Every Day Smoker  . Smokeless tobacco: Never Used  Substance Use Topics  . Alcohol use: No  . Drug use: Yes    Types: Marijuana     Allergies   Penicillins   Review of Systems Review of Systems   Physical Exam Updated Vital Signs BP 112/65   Pulse 96   Resp (!) 43   SpO2 96%   Physical Exam   ED Treatments / Results  Labs (all labs ordered are listed, but only abnormal results are displayed)  Results for orders placed or performed during the hospital encounter of 04/02/18  Comprehensive metabolic panel  Result Value Ref Range   Sodium 136 135 - 145 mmol/L   Potassium 3.8 3.5 - 5.1 mmol/L   Chloride 99 (L) 101 - 111 mmol/L   CO2 22 22 - 32 mmol/L   Glucose, Bld 222 (H) 65 - 99 mg/dL   BUN 15 6 - 20 mg/dL   Creatinine, Ser 0.92 0.61 - 1.24 mg/dL   Calcium 9.4 8.9 - 10.3 mg/dL   Total Protein 7.6 6.5 - 8.1 g/dL   Albumin 3.9 3.5 - 5.0 g/dL   AST 25 15 - 41 U/L   ALT 29 17 - 63 U/L   Alkaline Phosphatase 73 38 - 126 U/L   Total Bilirubin 1.0 0.3 - 1.2 mg/dL   GFR calc non Af Amer >60 >60 mL/min   GFR calc Af Amer >60 >60 mL/min   Anion gap 15 5 - 15  CBC with Differential  Result Value Ref Range   WBC 14.1 (H) 4.0 - 10.5 K/uL   RBC 5.77  4.22 - 5.81 MIL/uL   Hemoglobin 18.0 (H) 13.0 - 17.0 g/dL   HCT 51.0 39.0 - 52.0 %   MCV 88.4 78.0 - 100.0 fL   MCH 31.2 26.0 - 34.0 pg   MCHC 35.3 30.0 - 36.0 g/dL   RDW 13.3 11.5 - 15.5 %   Platelets 170 150 - 400 K/uL   Neutrophils Relative % 41 %   Lymphocytes Relative 49 %   Monocytes Relative 7 %   Eosinophils Relative 3 %   Basophils Relative 0 %   Neutro Abs 5.8 1.7 - 7.7 K/uL   Lymphs Abs 6.9 (H) 0.7 - 4.0 K/uL   Monocytes Absolute 1.0 0.1 - 1.0 K/uL   Eosinophils Absolute 0.4 0.0 - 0.7 K/uL   Basophils Absolute 0.0 0.0 - 0.1 K/uL   WBC Morphology ATYPICAL LYMPHOCYTES   Protime-INR  Result Value Ref Range   Prothrombin Time 12.5 11.4 - 15.2 seconds   INR 0.95   APTT  Result Value Ref Range   aPTT 28 24 - 36 seconds  I-stat troponin, ED  Result Value Ref Range   Troponin i, poc 0.07 0.00 - 0.08 ng/mL   Comment 3          I-stat Chem 8, ED  Result Value Ref Range   Sodium 138 135 - 145 mmol/L   Potassium 3.8 3.5 - 5.1 mmol/L   Chloride 100 (L) 101 - 111 mmol/L   BUN 18 6 - 20 mg/dL   Creatinine, Ser 0.70 0.61 - 1.24 mg/dL   Glucose, Bld 220 (H) 65 - 99 mg/dL   Calcium, Ion 1.08 (L) 1.15 - 1.40 mmol/L   TCO2 25 22 - 32 mmol/L   Hemoglobin 18.0 (H) 13.0 - 17.0 g/dL   HCT 53.0 (H) 39.0 - 52.0 %   Laboratory interpretation all normal except leukocytosis, hyperglycemia    EKG  #1 EKG Interpretation  Date/Time:  Sunday Apr 02 2018 01:36:27 EDT Ventricular Rate:  49 PR Interval:    QRS Duration: 104 QT Interval:  348 QTC Calculation: 314 R Axis:   -51 Text Interpretation:  Sinus bradycardia Atrial premature complex LAD, consider left anterior fascicular block Repol abnrm, severe global ischemia (LM/MVD) Since last tracing 02 Jul 2016 ST depression in septal and lateral leads with  ST elevation now present in avR and V1 Confirmed by Rolland Porter [phone]) on 04/02/2018  2:10:01 AM   EKG Interpretation #2  Date/Time:  Sunday Apr 02 2018 01:37:15 EDT Ventricular  Rate:  126 PR Interval:    QRS Duration: 199 QT Interval:  376 QTC Calculation: 538 R Axis:   95 Text Interpretation:  Since last tracing of earlier today Atrial flutter with predominant 2:1 AV block Nonspecific intraventricular conduction delay Repol abnrm, prob ischemia, anterolateral lds Baseline wander in lead(s) V1 Confirmed by Rolland Porter [phone]) on 04/02/2018 2:11:18 AM        EKG Interpretation #3  Date/Time:  Sunday Apr 02 2018 01:44:04 EDT Ventricular Rate:  103 PR Interval:    QRS Duration: 96 QT Interval:  288 QTC Calculation: 377 R Axis:   -44 Text Interpretation:  Sinus or ectopic atrial tachycardia Left axis deviation Repol abnrm, severe global ischemia (LM/MVD) Confirmed by Rolland Porter [phone]) on 04/02/2018 2:12:03 AM      EMS EKG      Radiology Dg Chest Portable 1 View  Result Date: 04/02/2018 CLINICAL DATA:  Shortness of breath, chest pain. Extensive cardiac history. EXAM: PORTABLE CHEST 1 VIEW COMPARISON:  None. FINDINGS: Cardiomediastinal silhouette is normal. Mildly calcified aortic arch. Status post median sternotomy. Interstitial prominence without pleural effusion or focal consolidation. No pneumothorax. Soft tissue planes and included osseous structures are nonsuspicious. IMPRESSION: Mild interstitial prominence seen with pulmonary edema or atypical infection, potentially chronic. Aortic Atherosclerosis (ICD10-I70.0). Electronically Signed   By: Elon Alas M.D.   On: 04/02/2018 02:35    Procedures .Critical Care Performed by: Rolland Porter, MD Authorized by: Rolland Porter, MD   Critical care provider statement:    Critical care time (minutes):  31   Critical care was necessary to treat or prevent imminent or life-threatening deterioration of the following conditions:  Cardiac failure   Critical care was time spent personally by me on the following activities:  Discussions with consultants, evaluation of patient's response to treatment, examination of  patient, obtaining history from patient or surrogate, ordering and review of laboratory studies, re-evaluation of patient's condition and review of old charts   (including critical care time)  Cardiopulmonary Resuscitation (CPR) Procedure Note Directed/Performed by: Janice Norrie I personally directed ancillary staff and/or performed CPR in an effort to regain return of spontaneous circulation and to maintain cardiac, neuro and systemic perfusion.  Medications Ordered in ED Medications  ondansetron (ZOFRAN) 4 MG/2ML injection (has no administration in time range)  amiodarone (NEXTERONE PREMIX) 360-4.14 MG/200ML-% (1.8 mg/mL) IV infusion (has no administration in time range)  ondansetron (ZOFRAN) 4 MG/2ML injection (has no administration in time range)  heparin ADULT infusion 100 units/mL (25000 units/276m sodium chloride 0.45%) (has no administration in time range)  sodium chloride 0.9 % bolus 500 mL (has no administration in time range)  ondansetron (ZOFRAN) 4 MG/2ML injection (has no administration in time range)  metoCLOPramide (REGLAN) injection 10 mg (has no administration in time range)  amiodarone (NEXTERONE PREMIX) 360-4.14 MG/200ML-% (1.8 mg/mL) IV infusion (60 mg/hr Intravenous New Bag/Given 04/02/18 0145)  ondansetron (ZOFRAN) injection 4 mg (4 mg Intravenous Given 04/02/18 0145)  heparin bolus via infusion 4,000 Units (4,000 Units Intravenous Bolus from Bag 04/02/18 0210)  amiodarone (CORDARONE) 300 mg in dextrose 5 % 100 mL bolus (300 mg Intravenous New Bag/Given 04/02/18 0145)     Initial Impression / Assessment and Plan / ED Course  I have reviewed the triage vital signs and the nursing notes.  Pertinent labs & imaging results that were available during my care of the patient  were reviewed by me and considered in my medical decision making (see chart for details).     Shortly after patient arrived to the ED he was noted to go into V. tach.  When I entered the room the patient  was not responsive.  Chest compressions were being performed by EMS.  On the monitor it looks like patient had coarse V. Fib.  However later when we reviewed the strips on the monitor that was the lead we were looking at.  On the other lead it did look like V. tach and almost a torsades pattern.  He was shocked with 120 J and then went into a bradycardia with a pulse.  Patient slowly awakened.  He then went into a wide-complex tachycardia and patient started vomiting.  His vomiting was hard to control, he was given Zofran IV x4.  He was started on amiodarone bolus and drip.  Patient continued to complain of nausea and vomiting, he was also started on a heparin drip.  I spoke to Dr. Gwenlyn Found, cardiac interventionalists at 1:48 AM, he wants me to have the cardiology fellow come evaluate patient.  1:50 a.m. code STEMI was called.  Dr. Delfina Redwood, cardiologist was here at 1:57 AM, she is looked at the EKGs and agrees he needs to go to Cath Lab.  She is call Dr. Gwenlyn Found back.  Pt was taken to cath lab  Final Clinical Impressions(s) / ED Diagnoses   Final diagnoses:  ST elevation myocardial infarction (STEMI), unspecified artery (HCC)  Paroxysmal ventricular tachycardia (HCC)  Intractable vomiting with nausea, unspecified vomiting type    Plan admit  Rolland Porter, MD, Barbette Or, MD 04/02/18 [place], [place], MD 04/02/18 [phone]

## 2018-04-02 NOTE — Progress Notes (Signed)
Critical Lab Value received Troponin 17.74  Value expected with recent STEMI

## 2018-04-02 NOTE — Progress Notes (Signed)
Large Thigh hematoma noted at 1035. Pressure held til 1056 and gained control of bleeding. Now with Level 2 Right Groin. Will continue to monitor. Again instructed patient on needing to cough, laugh, lift head and the necessity to hold groin when doing this. Verbalized understanding.

## 2018-04-03 ENCOUNTER — Encounter (HOSPITAL_COMMUNITY): Payer: Self-pay

## 2018-04-03 ENCOUNTER — Other Ambulatory Visit: Payer: Self-pay

## 2018-04-03 DIAGNOSIS — I472 Ventricular tachycardia: Secondary | ICD-10-CM

## 2018-04-03 DIAGNOSIS — I255 Ischemic cardiomyopathy: Secondary | ICD-10-CM | POA: Diagnosis present

## 2018-04-03 DIAGNOSIS — I10 Essential (primary) hypertension: Secondary | ICD-10-CM

## 2018-04-03 DIAGNOSIS — Z720 Tobacco use: Secondary | ICD-10-CM

## 2018-04-03 DIAGNOSIS — I25709 Atherosclerosis of coronary artery bypass graft(s), unspecified, with unspecified angina pectoris: Secondary | ICD-10-CM

## 2018-04-03 DIAGNOSIS — E118 Type 2 diabetes mellitus with unspecified complications: Secondary | ICD-10-CM

## 2018-04-03 DIAGNOSIS — I2129 ST elevation (STEMI) myocardial infarction involving other sites: Secondary | ICD-10-CM

## 2018-04-03 DIAGNOSIS — E785 Hyperlipidemia, unspecified: Secondary | ICD-10-CM

## 2018-04-03 HISTORY — DX: Atherosclerosis of coronary artery bypass graft(s), unspecified, with unspecified angina pectoris: I25.709

## 2018-04-03 LAB — GLUCOSE, CAPILLARY
GLUCOSE-CAPILLARY: 187 mg/dL — AB (ref 65–99)
GLUCOSE-CAPILLARY: 220 mg/dL — AB (ref 65–99)
Glucose-Capillary: 214 mg/dL — ABNORMAL HIGH (ref 65–99)
Glucose-Capillary: 109 mg/dL — ABNORMAL HIGH (ref 65–99)

## 2018-04-03 LAB — PATHOLOGIST SMEAR REVIEW: Path Review: REACTIVE

## 2018-04-03 LAB — CBC
HCT: 38.9 % — ABNORMAL LOW (ref 39.0–52.0)
Hemoglobin: 13.6 g/dL (ref 13.0–17.0)
MCH: 30.6 pg (ref 26.0–34.0)
MCHC: 35 g/dL (ref 30.0–36.0)
MCV: 87.6 fL (ref 78.0–100.0)
Platelets: 176 10*3/uL (ref 150–400)
RBC: 4.44 MIL/uL (ref 4.22–5.81)
RDW: 13.2 % (ref 11.5–15.5)
WBC: 15.7 10*3/uL — ABNORMAL HIGH (ref 4.0–10.5)

## 2018-04-03 LAB — TROPONIN I
Troponin I: 37.12 ng/mL (ref <0.03)
Troponin I: 17.4 ng/mL (ref <0.03)

## 2018-04-03 LAB — LIPID PANEL
Cholesterol: 140 mg/dL (ref 0–200)
HDL: 27 mg/dL — AB (ref >40)
LDL Cholesterol: 93 mg/dL (ref 0–99)
TRIGLYCERIDES: 99 mg/dL (ref <150)
Total CHOL/HDL Ratio: 5.2 RATIO
VLDL: 20 mg/dL (ref 0–40)

## 2018-04-03 LAB — ECHOCARDIOGRAM COMPLETE
Height: 73 in
WEIGHTICAEL: 3770.75 [oz_av]

## 2018-04-03 LAB — POCT ACTIVATED CLOTTING TIME: ACTIVATED CLOTTING TIME: 334 s

## 2018-04-03 MED ORDER — AMIODARONE HCL 200 MG PO TABS
400.0000 mg | ORAL_TABLET | Freq: Two times a day (BID) | ORAL | Status: DC
  Administered 2018-04-03 – 2018-04-05 (×5): 400 mg via ORAL
  Filled 2018-04-03 (×5): qty 2

## 2018-04-03 MED ORDER — ATORVASTATIN CALCIUM 80 MG PO TABS
80.0000 mg | ORAL_TABLET | Freq: Every day | ORAL | Status: DC
  Administered 2018-04-04: 80 mg via ORAL
  Filled 2018-04-03: qty 1

## 2018-04-03 NOTE — H&P (Signed)
HISTORY AND PHYSICAL EXAM NOTE   See Original Consult Note by Dr. Delfina Redwood 04/02/2018.   Rudean Curt, MD  Physician  Cardiology  Consult Note  Signed  Date of Service:  04/02/2018 2:22 AM          Signed      Expand All Collapse All       Show:Clear all '[x]'$ Manual'[x]'$ Template'[]'$ Copied  Added by: '[x]'$ Rudean Curt, MD   '[]'$ Hover for details    Cardiology Consult   Patient ID: Matthew Moses, MRN: [id number], DOB: [birth year]   Date of Encounter: 04/02/2018, 2:22 AM  Primary Care Provider: System, Provider Not In Cardiologist: NA  Chief Complaint:  CP  History of Present Illness: Matthew Moses is a 67 y.o. male with h/o CAD, s/p CABG, HTN, dyslipidemia presented to the ED tonight c/o CP that started earlier this evening. He says it was a midsternal discomfort that started suddenly at rest earlier tonight. It was associated with SOB; he called EMS, and EMS gave him albuterol MDI as well as NTG. Upon arrival to the ED, EMS was coding pt for [facility] and pulselessness. ED physician says the monitor showed appearance c/w possible Torsades, and pt was shocked w/ restoration of SB. He had a recurrence of WCT subsequent to that episode that then resolved spontaneously.  When I arrived to see pt, he was ill-appearing and on NRB. He still described 8/10 CP at that time. He was HD stable.       Past Medical History:  Diagnosis Date  . Coronary artery disease   . High cholesterol   . Hx of CABG   . Hypertension          Past Surgical History:  Procedure Laterality Date  . CORONARY ARTERY BYPASS GRAFT    . KNEE SURGERY    . MUSCLE REPAIR    . TONSILLECTOMY              Prior to Admission medications   Medication Sig Start Date End Date Taking? Authorizing Provider  aspirin EC 325 MG EC tablet Take 1 tablet (325 mg total) by mouth daily. 07/05/16   Donzetta Starch, NP  atorvastatin (LIPITOR) 20 MG tablet Take 1 tablet (20 mg total)  by mouth daily at 6 PM. 07/05/16   Donzetta Starch, NP  blood glucose meter kit and supplies Dispense based on patient and insurance preference. Use up to four times daily as directed. (FOR ICD-9 250.00, 250.01). 07/05/16   Donzetta Starch, NP  insulin glargine (LANTUS) 100 UNIT/ML injection Inject 0.2 mLs (20 Units total) into the skin at bedtime. 07/05/16   Donzetta Starch, NP  Lancets (ONETOUCH ULTRASOFT) lancets Use as instructed 07/05/16   Donzetta Starch, NP  naproxen (NAPROSYN) 500 MG tablet Take 1 tablet (500 mg total) by mouth 2 (two) times daily. 04/29/16   Tanna Furry, MD  promethazine (PHENERGAN) 25 MG tablet Take 1 tablet (25 mg total) by mouth every 6 (six) hours as needed for nausea or vomiting. 04/29/16   Tanna Furry, MD     Allergies:     Allergies  Allergen Reactions  . Penicillins Itching and Rash    Social History:  The patient  reports that he has been smoking.  He has never used smokeless tobacco. He reports that he has current or past drug history. Drug: Marijuana. He reports that he does not drink alcohol.   Family History:  The patient's family history is not on file.  FHx non-contributory  ROS:  Please see the history of present illness.     All other systems reviewed and negative.   Vital Signs: Blood pressure 112/65, pulse 96, resp. rate (!) 43, SpO2 90 %.  PHYSICAL EXAM: General:  Well nourished, well developed, in moderate distress. diaphoretic HEENT: normal Lymph: no adenopathy Neck: no JVD Endocrine:  No thryomegaly Vascular: No carotid bruits; DP pulses 2+ bilaterally  Cardiac:  normal S1, S2; RRR; no murmur  Lungs:  clear to auscultation bilaterally, no wheezing, rhonchi or rales  Abd: soft, nontender, no hepatomegaly  Ext: no edema Musculoskeletal:  No deformities, BUE and BLE strength normal and equal Skin: warm and dry  Neuro:  CNs 2-12 intact, no focal abnormalities noted Psych:  Normal affect   EKG:  04-02-18 NSR with ST elevation  in aVR and V1, diffuse ST depression in other leads  Labs:   RecentLabs       Lab Results  Component Value Date   WBC 9.4 07/05/2016   HGB 18.0 (H) 04/02/2018   HCT 53.0 (H) 04/02/2018   MCV 89.2 07/05/2016   PLT 162 07/05/2016     LastLabs     Recent Labs  Lab 04/02/18 0158  NA 138  K 3.8  CL 100*  BUN 18  CREATININE 0.70  GLUCOSE 220*     RecentLabs(last2labs)  No results for input(s): CKTOTAL, CKMB, TROPONINI in the last 72 hours.   Troponin (Point of Care Test) RecentLabs(last2labs)  Recent Labs    04/02/18 0201  TROPIPOC 0.07      RecentLabs       Lab Results  Component Value Date   CHOL 159 07/03/2016   HDL 24 (L) 07/03/2016   LDLCALC 98 07/03/2016   TRIG 183 (H) 07/03/2016     RecentLabs  No results found for: DDIMER    Radiology/Studies:   ImagingResults  Dg Chest Portable 1 View  Result Date: 04/02/2018 CLINICAL DATA:  Shortness of breath, chest pain. Extensive cardiac history. EXAM: PORTABLE CHEST 1 VIEW COMPARISON:  None. FINDINGS: Cardiomediastinal silhouette is normal. Mildly calcified aortic arch. Status post median sternotomy. Interstitial prominence without pleural effusion or focal consolidation. No pneumothorax. Soft tissue planes and included osseous structures are nonsuspicious. IMPRESSION: Mild interstitial prominence seen with pulmonary edema or atypical infection, potentially chronic. Aortic Atherosclerosis (ICD10-I70.0). Electronically Signed   By: Elon Alas M.D.   On: 04/02/2018 02:35       ASSESSMENT AND PLAN:   1. CAD/STEMI/arrest: s/p pulseless VT and DCCV back to NSR. Baseline EKG shows acute ST elevation in aVR and V1 w/ reciprocal ST depression in pt w/ h/o significant CAD, s/p CABG. Details regarding his coronary anatomy and CABG are not known. Will take urgently to cath lab for further evalution  2. Dyslipidemia: LDL 98; needs high-intensity statin therapy  3. HTN: BP  stable at this point. Will follow   Thank you for the opportunity to participate in the care of this patient. Will follow. Please call w/ questions.   Rudean Curt, MD , [facility]  04/02/2018   2:22 AM            Routing History

## 2018-04-03 NOTE — Progress Notes (Addendum)
Progress Note  Patient Name: Matthew Moses Date of Encounter: 04/03/2018  Primary Cardiologist: Nanetta Batty, MD   Subjective   Troponin this morning is trending now after peak >> 65. Only notes post CPR chest soreness - no angina or dyspnea. Able to get up & go to bathroom without difficulty - has yet to walk in hall.  Inpatient Medications    Scheduled Meds: . amiodarone  400 mg Oral BID  . aspirin  81 mg Oral Daily  . atorvastatin  20 mg Oral q1800  . insulin aspart  0-15 Units Subcutaneous TID WC  . insulin aspart  0-5 Units Subcutaneous QHS  . insulin glargine  20 Units Subcutaneous QHS  . metoCLOPramide (REGLAN) injection  10 mg Intravenous Once  . sodium chloride flush  3 mL Intravenous Q12H  . ticagrelor  90 mg Oral BID   Continuous Infusions: . sodium chloride     PRN Meds: sodium chloride, acetaminophen, morphine injection, ondansetron (ZOFRAN) IV, prochlorperazine, sodium chloride flush   Vital Signs    Vitals:   04/03/18 0500 04/03/18 0600 04/03/18 0700 04/03/18 0800  BP: 109/61 (!) 90/53 (!) 85/60 123/63  Pulse: 62 64 65 78  Resp: 19 17 19 20   Temp:   98.3 F (36.8 C)   TempSrc:   Oral   SpO2: 99% 98% 96% 95%  Weight: 232 lb (105.2 kg)     Height:        Intake/Output Summary (Last 24 hours) at 04/03/2018 0844 Last data filed at 04/03/2018 0800 Gross per 24 hour  Intake 1418.8 ml  Output 1000 ml  Net 418.8 ml   Filed Weights   04/02/18 0329 04/02/18 0430 04/03/18 0500  Weight: 241 lb (109.3 kg) 235 lb 10.8 oz (106.9 kg) 232 lb (105.2 kg)    Telemetry    NSR  - Personally Reviewed  ECG     NSR  - Personally Reviewed  Physical Exam   Physical Exam  Constitutional: He is oriented to person, place, and time. He appears well-developed and well-nourished. No distress.  Appears older than stated age  HENT:  Head: Normocephalic and atraumatic.  Neck: No JVD present.  Cardiovascular: Normal rate, regular rhythm and normal heart sounds.   Occasional extrasystoles are present. PMI is not displaced. Exam reveals decreased pulses (But palpable). Exam reveals no gallop and no friction rub.  No murmur heard. Pulmonary/Chest: Effort normal and breath sounds normal. No respiratory distress. He has no wheezes. He has no rales. He exhibits tenderness (Post CPR/shock).  Abdominal: Soft. Bowel sounds are normal. He exhibits no distension. There is no tenderness. There is no rebound.  Musculoskeletal: Normal range of motion. He exhibits no edema.  Neurological: He is alert and oriented to person, place, and time.  Still has baseline right facial droop  Skin: Skin is warm and dry.  Psychiatric:  Somewhat gruff sounding affect but normal mood; on exam seems to have normal judgment, but per nursing staff, was reluctant to stay in bed post PCI and sheath removal, then has been reluctant to take medications.  Vitals reviewed.    Labs    Chemistry Recent Labs  Lab 04/02/18 0152 04/02/18 0158 04/02/18 0543  NA 136 138 136  K 3.8 3.8 3.5  CL 99* 100* 96*  CO2 22  --  26  GLUCOSE 222* 220* 283*  BUN 15 18 14   CREATININE 0.92 0.70 0.94  CALCIUM 9.4  --  8.5*  PROT 7.6  --   --  ALBUMIN 3.9  --   --   AST 25  --   --   ALT 29  --   --   ALKPHOS 73  --   --   BILITOT 1.0  --   --   GFRNONAA >60  --  >60  GFRAA >60  --  >60  ANIONGAP 15  --  14     Hematology Recent Labs  Lab 04/02/18 0152 04/02/18 0158 04/02/18 0543 04/03/18 0352  WBC 14.1*  --  15.6* 15.7*  RBC 5.77  --  5.18 4.44  HGB 18.0* 18.0* 15.9 13.6  HCT 51.0 53.0* 45.6 38.9*  MCV 88.4  --  88.0 87.6  MCH 31.2  --  30.7 30.6  MCHC 35.3  --  34.9 35.0  RDW 13.3  --  13.3 13.2  PLT 170  --  192 176    Cardiac Enzymes Recent Labs  Lab 04/02/18 1012 04/02/18 1553 04/02/18 2130 04/03/18 0254  TROPONINI 48.22* >65.00* 48.73* 37.12*    Recent Labs  Lab 04/02/18 0201  TROPIPOC 0.07    Lab Results  Component Value Date   CHOL 140 04/03/2018   HDL 27  (L) 04/03/2018   LDLCALC 93 04/03/2018   TRIG 99 04/03/2018   CHOLHDL 5.2 04/03/2018    BNPNo results for input(s): BNP, PROBNP in the last 168 hours.   DDimer No results for input(s): DDIMER in the last 168 hours.   Radiology    Dg Chest Portable 1 View  Result Date: 04/02/2018 CLINICAL DATA:  Shortness of breath, chest pain. Extensive cardiac history. EXAM: PORTABLE CHEST 1 VIEW COMPARISON:  None. FINDINGS: Cardiomediastinal silhouette is normal. Mildly calcified aortic arch. Status post median sternotomy. Interstitial prominence without pleural effusion or focal consolidation. No pneumothorax. Soft tissue planes and included osseous structures are nonsuspicious. IMPRESSION: Mild interstitial prominence seen with pulmonary edema or atypical infection, potentially chronic. Aortic Atherosclerosis (ICD10-I70.0). Electronically Signed   By: Awilda Metro M.D.   On: 04/02/2018 02:35    Cardiac Studies    Cardiac Cath-PCI 04/02/2018:   Ostial Left Main 50%; distal Left Main-pLAD 100% - d(apical)LAD 95 (after patent LIMA-LAD).OstRI - 99%; ost-mLCx 95%, dCx 95% & OM2 95%; ost-pRCA (co-dominant) 99%mRCA 80%; SVG-LPDA 99% ostial thrombosis  PTCA - thrombectomy -> DES PCI Synergy DES 3.5 x 20 (3.9 mm)  EF 30% with LVEDP 31 mmHg.   2 D Echo (post MI): Echo complete.  Report not yet entered.  Echocardiogram from August 2017 normal LVEF 60-65%. -No RWMA  Patient Profile     67 year old gentleman with a history of coronary artery disease status status post coronary artery bypass grafting, hypertension, dyslipidemia presented to the ER early AM 04/02/2018 with Severe SSCP -->  Presenting EKG revealed sinus bradycardia at 49 beats a minute with ST segment elevation in lead aVR in lead V1.  He had ST segment depression in the other leads.  In ER, he had wide-complex tachycardia and pulselessness.  He was shocked with restoration of sinus rhythm.  Patient was taken emergently to heart  catheterization.  He is found to have a thrombus in the saphenous vein graft to first obtuse marginal.  The thrombus was aspirated and a stent was successfully placed.  Recurrent wide-complex tachycardia overnight post PCI that spontaneously resolved.  Assessment & Plan    Principal Problem:   Acute ST elevation myocardial infarction (STEMI) of posterior wall (HCC) Active Problems:   Ventricular tachycardia, sustained (HCC) -n setting of STEMI  Coronary artery disease involving coronary bypass graft of native heart with angina pectoris (HCC) - thrombotic 95% oclusion of SVG-LPDA   Ischemic cardiomyopathy -EF 30% by LV gram   Essential hypertension   Diabetes mellitus type 2, controlled, with complications (HCC)   Hyperlipidemia LDL goal <70   Tobacco abuse   Acute posterior MI/nonsustained V. tach/CAD of graft with angina - h/o CABG X 2 (LIMA-LAD, SVG-LPDA).  .  -- He had acute thrombosis SVG-LPDA -- DES PCI --still noted chest pain in the morning after PCI.  On aspirin plus Brilinta  Due to V. tach: Remains on amiodarone infusion -> will convert to p.o. to continue load, would probably continue for least 1 month  Unable to initiate beta-blocker due to hypotension (BP a bit better)  [facility] to see   Severely reduced LVEF on LV gram.  Echo pending. --Depending on results, may need to consider LifeVest  As blood pressure tolerates, will need to consider afterload reduction (would probably start with low-dose ARB as opposed to beta-blocker since he is on amiodarone already)  Hyperlipidemia: LDL 93.  He was on 20 of atorvastatin at home, will increase to 80 mg for the short-term.    Diabetes mellitus: Well-controlled at home per report-> diabetic coordinator consult.  Glucose was 283 on the 12th.  A1c 7.7  On Lantus now with sliding scale and meal coverage.  Would consider initiating Jardiance on discharge  He has baseline hypertension, currently hypotensive.  Medications on  hold.--Depending on echo findings, would consider initiating CHF type medications including ARB/Entresto and possibly carvedilol/Toprol.  With hypotension and amiodarone drip continuing, will monitor in ICU during the day until off of IV amiodarone and pressure stable, ambulating.  Anticipate transfer to stepdown in the afternoon if blood pressure remained stable.  Smoking cessation counseling.  Residual from stroke is right-sided facial droop and handwriting difficulty   For questions or updates, please contact [facility] Please consult [email or web address] for contact info under Cardiology/STEMI.      Signed, Bryan Lemma, MD  04/03/2018, 8:44 AM

## 2018-04-03 NOTE — Progress Notes (Signed)
Patient reports improvement in nausea. He reports still feeling queasy but not to the point he feels like he needs to vomit. Patient noted to go to bathroom and back to bed without experiencing severe nausea as compared to prior at the beginning of the shift.

## 2018-04-03 NOTE — Progress Notes (Signed)
Inpatient Diabetes Program Recommendations  AACE/ADA: New Consensus Statement on Inpatient Glycemic Control (2015)  Target Ranges:  Prepandial:   less than 140 mg/dL      Peak postprandial:   less than 180 mg/dL (1-2 hours)      Critically ill patients:  140 - 180 mg/dL   Lab Results  Component Value Date   GLUCAP 220 (H) 04/03/2018   HGBA1C 7.7 (H) 04/02/2018    Review of Glycemic Control  Diabetes history: DM2 Outpatient Diabetes medications: None Current orders for Inpatient glycemic control: Lantus 20 units + Novolog moderate scale tid + hs  Inpatient Diabetes Program Recommendations:   Received referral to speak with patient regarding diabetes management. Spoke with patient @ bedside and patient states he is doing well and what he does is working for him. Explained in detail concerns with cardiovascular risks with increased blood glucose. Patient states he is aware but does not intend to take insulin and wants to continue to drink Bobby Rumpf 2-5 drinks per day. Patient states he has been to classes and understands the risks but states "I am if ready if something should happen to me." States he has no children and only one brother living. Explained long acting insulin but patient is not interested in discussion.  Thank you, Billy Fischer. Persephanie Laatsch, RN, MSN, CDE  Diabetes Coordinator Inpatient Glycemic Control Team Team Pager [phone] (8am-5pm) 04/03/2018 2:47 PM

## 2018-04-03 NOTE — Progress Notes (Signed)
CARDIAC REHAB PHASE I   PRE:  Rate/Rhythm: 94 SR  BP:  Supine:   Sitting:   Standing: 78/66   SaO2:   MODE:  Ambulation: 370 ft   POST:  Rate/Rhythm: 95 SR  BP:  Supine:   Sitting: 114/74  Standing:    SaO2: 98%RA 1110-1200 Pt up in room when I entered. Denied dizziness with BP of 78/66 standing. Walked 370 ft on RA with steady gait and no CP. BP improved with walk. To recliner after walk. MI education completed with pt who voiced understanding. Stressed importance of brilinta with stent. Needs to see case manager. Reviewed MI restrictions, NTG use, smoking cessation, gave diabetic and heart healthy diets, and ex ed. Gave pt fake cigarette. He stated he was going to make effort to quit. Pt stated he attended CRP 2 in [place] after CABG. Does not want to attend again but ok with referral to [place].    Luetta Nutting, RN BSN  04/03/2018 11:54 AM

## 2018-04-04 LAB — COMPREHENSIVE METABOLIC PANEL
ALT: 50 U/L (ref 17–63)
ANION GAP: 9 (ref 5–15)
AST: 68 U/L — ABNORMAL HIGH (ref 15–41)
Albumin: 3.3 g/dL — ABNORMAL LOW (ref 3.5–5.0)
Alkaline Phosphatase: 57 U/L (ref 38–126)
BUN: 16 mg/dL (ref 6–20)
CHLORIDE: 96 mmol/L — AB (ref 101–111)
CO2: 28 mmol/L (ref 22–32)
Calcium: 8.4 mg/dL — ABNORMAL LOW (ref 8.9–10.3)
Creatinine, Ser: 1.02 mg/dL (ref 0.61–1.24)
GFR calc Af Amer: >60 mL/min (ref >60)
Glucose, Bld: 241 mg/dL — ABNORMAL HIGH (ref 65–99)
Potassium: 3.8 mmol/L (ref 3.5–5.1)
SODIUM: 133 mmol/L — AB (ref 135–145)
Total Bilirubin: 2.3 mg/dL — ABNORMAL HIGH (ref 0.3–1.2)
Total Protein: 6.3 g/dL — ABNORMAL LOW (ref 6.5–8.1)

## 2018-04-04 LAB — GLUCOSE, CAPILLARY
GLUCOSE-CAPILLARY: 230 mg/dL — AB (ref 65–99)
GLUCOSE-CAPILLARY: 123 mg/dL — AB (ref 65–99)
GLUCOSE-CAPILLARY: 215 mg/dL — AB (ref 65–99)
Glucose-Capillary: 150 mg/dL — ABNORMAL HIGH (ref 65–99)

## 2018-04-04 LAB — CBC
HCT: 37.9 % — ABNORMAL LOW (ref 39.0–52.0)
Hemoglobin: 12.9 g/dL — ABNORMAL LOW (ref 13.0–17.0)
MCH: 29.9 pg (ref 26.0–34.0)
MCHC: 34 g/dL (ref 30.0–36.0)
MCV: 87.9 fL (ref 78.0–100.0)
PLATELETS: 185 10*3/uL (ref 150–400)
RBC: 4.31 MIL/uL (ref 4.22–5.81)
RDW: 12.9 % (ref 11.5–15.5)
WBC: 13.6 10*3/uL — AB (ref 4.0–10.5)

## 2018-04-04 MED ORDER — SENNA 8.6 MG PO TABS
2.0000 | ORAL_TABLET | Freq: Every day | ORAL | Status: DC
  Administered 2018-04-04: 17.2 mg via ORAL
  Filled 2018-04-04: qty 2

## 2018-04-04 MED ORDER — POLYETHYLENE GLYCOL 3350 17 G PO PACK
17.0000 g | PACK | Freq: Every day | ORAL | Status: DC | PRN
  Administered 2018-04-04: 17 g via ORAL
  Filled 2018-04-04: qty 1

## 2018-04-04 MED FILL — Nitroglycerin IV Soln 100 MCG/ML in D5W: INTRA_ARTERIAL | Qty: 10 | Status: AC

## 2018-04-04 MED FILL — Heparin Sod (Porcine)-NaCl IV Soln 1000 Unit/500ML-0.9%: INTRAVENOUS | Qty: 1000 | Status: AC

## 2018-04-04 NOTE — Progress Notes (Signed)
CARDIAC REHAB PHASE I   PRE:  Rate/Rhythm: 72 SR  BP:  Supine:   Sitting: 98/77  Standing:    SaO2:   MODE:  Ambulation: 940 ft   POST:  Rate/Rhythm: 87 SR  BP:  Supine:   Sitting: 118/70  Standing:    SaO2:  1135-1152 Pt walked 940 ft on RA with steady gait and no CP. Tolerated well. Reinforced brilinta importance. Pt stated he knows he needs to make some changes in taking care of self.    Luetta Nutting, RN BSN  04/04/2018 11:49 AM

## 2018-04-04 NOTE — Plan of Care (Signed)
  Problem: Coping: Goal: Level of anxiety will decrease Outcome: Completed/Met

## 2018-04-04 NOTE — Care Management Note (Addendum)
Case Management Note  Patient Details  Name: Matthew Moses MRN: [id number] Date of Birth: Dec 11, 1950  Subjective/Objective: Pt presented for Chest Pain- Stemi- post cath with stent. Plan for home on Brilinta. Pt has Medicaid and gets medications that ranges around $3.00. Pt use [facility] [address] and [facility] Noble is not available. Pt will need to use a local [facility] to pick up Brilinta 30 day free. CM did provide pt with 30 day free card.                   Action/Plan: PTA from home with girlfriend and pt will transition home. Pt states that Cardiology will be his primary care. No further needs from CM at this time.   Expected Discharge Date:  04/07/18               Expected Discharge Plan:  Home/Self Care  In-House Referral:  NA  Discharge planning Services  CM Consult, Medication Assistance  Post Acute Care Choice:  NA Choice offered to:  NA  DME Arranged:  N/A DME Agency:  NA  HH Arranged:  NA HH Agency:  NA  Status of Service:  Completed, signed off  If discussed at Long Length of Stay Meetings, dates discussed:    Additional Comments:  Gala Lewandowsky, RN 04/04/2018, 2:37 PM

## 2018-04-04 NOTE — Plan of Care (Signed)
  Problem: Activity: Goal: Risk for activity intolerance will decrease Outcome: Completed/Met

## 2018-04-04 NOTE — Progress Notes (Signed)
Progress Note  Patient Name: Matthew Moses Date of Encounter: 04/04/2018  Primary Cardiologist: Nanetta Batty, MD   Subjective   Troponin this morning is trending now after peak >> 65. Feels well this morning.  Slept well.  Walked all around the hall with no difficulty.  He says he did laps around the nursing station. Chest wall discomfort is improved somewhat, but still present. No further episodes  Inpatient Medications    Scheduled Meds: . amiodarone  400 mg Oral BID  . aspirin  81 mg Oral Daily  . atorvastatin  80 mg Oral q1800  . insulin aspart  0-15 Units Subcutaneous TID WC  . insulin aspart  0-5 Units Subcutaneous QHS  . insulin glargine  20 Units Subcutaneous QHS  . metoCLOPramide (REGLAN) injection  10 mg Intravenous Once  . sodium chloride flush  3 mL Intravenous Q12H  . ticagrelor  90 mg Oral BID   Continuous Infusions: . sodium chloride     PRN Meds: sodium chloride, acetaminophen, morphine injection, ondansetron (ZOFRAN) IV, polyethylene glycol, prochlorperazine, sodium chloride flush   Vital Signs    Vitals:   04/04/18 0143 04/04/18 0515 04/04/18 0838 04/04/18 0838  BP: 94/62 110/75 (!) 99/59 (!) 99/59  Pulse: 79 77 70 70  Resp: 15 15    Temp: (!) 97.4 F (36.3 C) 98.7 F (37.1 C) 98.2 F (36.8 C) 98.2 F (36.8 C)  TempSrc: Oral Oral Oral Oral  SpO2: 99% 100% 100% 100%  Weight: 232 lb 1.6 oz (105.3 kg)     Height:        Intake/Output Summary (Last 24 hours) at 04/04/2018 1124 Last data filed at 04/04/2018 0953 Gross per 24 hour  Intake 243 ml  Output -  Net 243 ml   Filed Weights   04/02/18 0430 04/03/18 0500 04/04/18 0143  Weight: 235 lb 10.8 oz (106.9 kg) 232 lb (105.2 kg) 232 lb 1.6 oz (105.3 kg)    Telemetry    NSR (no further VT)- Personally Reviewed  ECG     NSR  - Personally Reviewed  Physical Exam   Physical Exam  Constitutional: He is oriented to person, place, and time. He appears well-developed and well-nourished.  No distress.  Appears older than stated age  HENT:  Head: Normocephalic and atraumatic.  Neck: No JVD present.  Cardiovascular: Normal rate, regular rhythm and normal heart sounds.  Occasional extrasystoles are present. PMI is not displaced. Exam reveals decreased pulses (But palpable). Exam reveals no gallop and no friction rub.  No murmur heard. Pulmonary/Chest: Effort normal and breath sounds normal. No respiratory distress. He has no wheezes. He has no rales. He exhibits tenderness (Post CPR/shock).  Abdominal: Soft. Bowel sounds are normal. He exhibits no distension. There is no tenderness.  Musculoskeletal: Normal range of motion. He exhibits no edema.  Neurological: He is alert and oriented to person, place, and time.  Still has baseline right facial droop  Psychiatric: He has a normal mood and affect. His behavior is normal. Thought content normal.  Vitals reviewed.    Labs    Chemistry Recent Labs  Lab 04/02/18 0152 04/02/18 0158 04/02/18 0543 04/04/18 1014  NA 136 138 136 133*  K 3.8 3.8 3.5 3.8  CL 99* 100* 96* 96*  CO2 22  --  26 28  GLUCOSE 222* 220* 283* 241*  BUN 15 18 14 16   CREATININE 0.92 0.70 0.94 1.02  CALCIUM 9.4  --  8.5* 8.4*  PROT 7.6  --   --  6.3*  ALBUMIN 3.9  --   --  3.3*  AST 25  --   --  68*  ALT 29  --   --  50  ALKPHOS 73  --   --  57  BILITOT 1.0  --   --  2.3*  GFRNONAA >60  --  >60 >60  GFRAA >60  --  >60 >60  ANIONGAP 15  --  14 9     Hematology Recent Labs  Lab 04/02/18 0543 04/03/18 0352 04/04/18 1014  WBC 15.6* 15.7* 13.6*  RBC 5.18 4.44 4.31  HGB 15.9 13.6 12.9*  HCT 45.6 38.9* 37.9*  MCV 88.0 87.6 87.9  MCH 30.7 30.6 29.9  MCHC 34.9 35.0 34.0  RDW 13.3 13.2 12.9  PLT 192 176 185    Cardiac Enzymes Recent Labs  Lab 04/02/18 1553 04/02/18 2130 04/03/18 0254 04/03/18 0937  TROPONINI >65.00* 48.73* 37.12* 17.40*    Recent Labs  Lab 04/02/18 0201  TROPIPOC 0.07    Lab Results  Component Value Date   CHOL  140 04/03/2018   HDL 27 (L) 04/03/2018   LDLCALC 93 04/03/2018   TRIG 99 04/03/2018   CHOLHDL 5.2 04/03/2018    BNPNo results for input(s): BNP, PROBNP in the last 168 hours.   DDimer No results for input(s): DDIMER in the last 168 hours.   Radiology    No results found.  Cardiac Studies    Cardiac Cath-PCI 04/02/2018:   Ostial Left Main 50%; distal Left Main-pLAD 100% - d(apical)LAD 95 (after patent LIMA-LAD).OstRI - 99%; ost-mLCx 95%, dCx 95% & OM2 95%; ost-pRCA (co-dominant) 99%mRCA 80%; SVG-LPDA 99% ostial thrombosis  PTCA - thrombectomy -> DES PCI Synergy DES 3.5 x 20 (3.9 mm)  EF 30% with LVEDP 31 mmHg.   2 D Echo (post MI): Echo complete.  Report not yet entered.  Echocardiogram from August 2017 normal LVEF 60-65%. -No RWMA  Patient Profile     67 year old gentleman with a history of coronary artery disease status status post coronary artery bypass grafting, hypertension, dyslipidemia presented to the ER early AM 04/02/2018 with Severe SSCP -->  Presenting EKG revealed sinus bradycardia at 49 beats a minute with ST segment elevation in lead aVR in lead V1.  He had ST segment depression in the other leads.  In ER, he had wide-complex tachycardia and pulselessness.  He was shocked with restoration of sinus rhythm.  Patient was taken emergently to heart catheterization.  He is found to have a thrombus in the saphenous vein graft to first obtuse marginal.  The thrombus was aspirated and a stent was successfully placed.  Recurrent wide-complex tachycardia overnight post PCI that spontaneously resolved. -No further episodes  Assessment & Plan    Principal Problem:   Acute ST elevation myocardial infarction (STEMI) of posterior wall (HCC) Active Problems:   Ventricular tachycardia, sustained (HCC) -n setting of STEMI   Coronary artery disease involving coronary bypass graft of native heart with angina pectoris (HCC) - thrombotic 95% oclusion of SVG-LPDA   Ischemic  cardiomyopathy -EF 30% by LV gram   Essential hypertension   Diabetes mellitus type 2, controlled, with complications (HCC)   Hyperlipidemia LDL goal <70   Tobacco abuse   Acute posterior MI/nonsustained V. tach/CAD of graft with angina - h/o CABG X 2 (LIMA-LAD, SVG-LPDA).  .  -- He had acute thrombosis SVG-LPDA -- DES PCI --still noted chest pain in the morning after PCI.  On aspirin plus Brilinta  Due to V.  tach: Remains on amiodarone infusion -> will convert to p.o. to continue load, would probably continue for least 1 month  Unable to initiate beta-blocker due to hypotension (BP a bit better)  [facility] to see   Severely reduced LVEF on LV gram.  Echo pending. --Depending on results, may need to consider LifeVest  As blood pressure tolerates, will need to consider afterload reduction (would probably start with low-dose ARB as opposed to beta-blocker since he is on amiodarone already)  Hyperlipidemia: LDL 93.  He was on 20 of atorvastatin at home, will increase to 80 mg for the short-term.    Diabetes mellitus: Well-controlled at home per report-> diabetic coordinator consult.  Glucose was 283 on the 12th.  A1c 7.7  On Lantus now with sliding scale and meal coverage.  Would consider initiating Jardiance on discharge  He has baseline hypertension, currently hypotensive.  Medications on hold.--Depending on echo findings, would consider initiating CHF type medications including ARB/Entresto and possibly carvedilol/Toprol.  With hypotension and amiodarone drip continuing, will monitor in ICU during the day until off of IV amiodarone and pressure stable, ambulating.  Anticipate transfer to stepdown in the afternoon if blood pressure remained stable.  Smoking cessation counseling.  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      Signed, Bryan Lemma, MD  04/04/2018, 11:24 AM

## 2018-04-04 NOTE — Plan of Care (Signed)
  Problem: Education: Goal: Knowledge of General Education information will improve Outcome: Progressing   Problem: Health Behavior/Discharge Planning: Goal: Ability to manage health-related needs will improve Outcome: Progressing   Problem: Clinical Measurements: Goal: Diagnostic test results will improve Outcome: Progressing Goal: Respiratory complications will improve Outcome: Progressing   Problem: Activity: Goal: Risk for activity intolerance will decrease Outcome: Progressing   Problem: Nutrition: Goal: Adequate nutrition will be maintained Outcome: Progressing   Problem: Coping: Goal: Level of anxiety will decrease Outcome: Progressing

## 2018-04-05 ENCOUNTER — Telehealth: Payer: Self-pay | Admitting: Cardiovascular Disease

## 2018-04-05 DIAGNOSIS — Z955 Presence of coronary angioplasty implant and graft: Secondary | ICD-10-CM

## 2018-04-05 LAB — GLUCOSE, CAPILLARY
GLUCOSE-CAPILLARY: 228 mg/dL — AB (ref 65–99)
GLUCOSE-CAPILLARY: 164 mg/dL — AB (ref 65–99)

## 2018-04-05 MED ORDER — EMPAGLIFLOZIN 10 MG PO TABS: 10.0000 mg | ORAL_TABLET | Freq: Every day | ORAL | 1 refills | Status: DC

## 2018-04-05 MED ORDER — TICAGRELOR 90 MG PO TABS: 90.0000 mg | ORAL_TABLET | Freq: Two times a day (BID) | ORAL | 2 refills | Status: DC

## 2018-04-05 MED ORDER — ATORVASTATIN CALCIUM 80 MG PO TABS: 80.0000 mg | ORAL_TABLET | Freq: Every day | ORAL | 1 refills | Status: DC

## 2018-04-05 MED ORDER — ASPIRIN 81 MG PO CHEW: 81.0000 mg | CHEWABLE_TABLET | Freq: Every day | ORAL | Status: DC

## 2018-04-05 MED ORDER — AMIODARONE HCL 200 MG PO TABS: ORAL_TABLET | ORAL | 0 refills | Status: DC

## 2018-04-05 MED ORDER — NITROGLYCERIN 0.4 MG SL SUBL: 0.4000 mg | SUBLINGUAL_TABLET | SUBLINGUAL | 2 refills | Status: DC | PRN

## 2018-04-05 MED ORDER — VALSARTAN 40 MG PO TABS: 40.0000 mg | ORAL_TABLET | Freq: Every day | ORAL | 2 refills | Status: DC

## 2018-04-05 NOTE — Progress Notes (Signed)
Discharge order received.  Iv and tele removed.  Discharge papers reviewed with and given to patient.

## 2018-04-05 NOTE — Discharge Summary (Signed)
Discharge Summary    Patient ID: Matthew Moses,  MRN: [id number], DOB/AGE: 03-10-1951 67 y.o.  Admit date: 04/02/2018 Discharge date: 04/05/2018  Primary Care Provider: System, Provider Not In Primary Cardiologist: Dr. Gwenlyn Found   Discharge Diagnoses    Principal Problem:   Acute ST elevation myocardial infarction (STEMI) of posterior wall [facility]) Active Problems:   Essential hypertension   Diabetes mellitus type 2, controlled, with complications ([place])   Hyperlipidemia LDL goal <70   Ventricular tachycardia, sustained ([place]) -n setting of STEMI   Coronary artery disease involving coronary bypass graft of native heart with angina pectoris ([place]) - thrombotic 95% oclusion of SVG-LPDA   Ischemic cardiomyopathy -EF 30% by LV gram   Tobacco abuse   Allergies Allergies  Allergen Reactions  . Codeine Hives  . Penicillins Itching and Rash    Has patient had a PCN reaction causing immediate rash, facial/tongue/throat swelling, SOB or lightheadedness with hypotension: Yes Has patient had a PCN reaction causing severe rash involving mucus membranes or skin necrosis: No Has patient had a PCN reaction that required hospitalization: No Has patient had a PCN reaction occurring within the last 10 years: No If all of the above answers are "NO", then may proceed with Cephalosporin use.     Diagnostic Studies/Procedures    Cath: 04/02/18  Conclusion     Ost LM to Mid LM lesion is 50% stenosed.  Dist LM to Prox LAD lesion is 100% stenosed.  Ost Ramus to Ramus lesion is 99% stenosed.  Ost RCA to Prox RCA lesion is 99% stenosed.  Mid RCA lesion is 80% stenosed.  Ost Cx to Mid Cx lesion is 95% stenosed.  Ost 2nd Mrg to 2nd Mrg lesion is 95% stenosed.  Dist Cx lesion is 95% stenosed.  Dist LAD lesion is 95% stenosed.  Prox Graft lesion is 99% stenosed.  A stent was successfully placed.  Post intervention, there is a 0% residual stenosis.  There is severe left ventricular  systolic dysfunction.  LV end diastolic pressure is severely elevated.     IMPRESSION: Mr. Colligan has severe native vessel disease with an occluded LAD, subtotally occluded nondominant to codominant RCA, circumflex and ramus branch.  His culprit vessel was the vein graft to a distal PLA that had a high-grade proximal thrombotic stenosis.  LIMA was intact.  I successfully stented his vein graft with a 3.5 mm drug-eluting stent using distal protection.  There is no distal embolization.  Angiomax will continue for 4 hours and Aggrastat for 18 hours.  Patient will need dual antiplatelet therapy uninterrupted for at least 12 months.  2D echo is pending.  He may need a LifeVest prior to discharge depending on his ejection fraction.  He will need aggressive risk factor modification including high-dose statin therapy, dual antiplatelet therapy, beta blockade and smoking cessation.  He will also need diabetic education.  He left the lab in stable condition.  Quay Burow. MD, [facility]  TTE: 04/02/18  Study Conclusions  - Left ventricle: The cavity size was normal. There was mild   concentric hypertrophy. Systolic function was mildly to   moderately reduced. The estimated ejection fraction was in the   range of 40% to 45%. There is hypokinesis in the basal and mid   inferoseptal , inferior and inferolateral walls. Features are   consistent with a pseudonormal left ventricular filling pattern,   with concomitant abnormal relaxation and increased filling   pressure (grade 2 diastolic dysfunction). Doppler parameters are  consistent with elevated ventricular end-diastolic filling   pressure. - Aortic valve: There was no regurgitation. - Mitral valve: There was mild regurgitation. - Left atrium: The atrium was moderately dilated. - Right ventricle: The cavity size was normal. Wall thickness was   normal. Systolic function was normal. - Right atrium: The atrium was normal in size. - Tricuspid valve:  There was trivial regurgitation. - Pulmonary arteries: Systolic pressure was within the normal   range. - Inferior vena cava: The vessel was normal in size. - Pericardium, extracardiac: There was no pericardial effusion.  Impressions:  - There is hypokinesis in the basal and mid inferoseptal , inferior   and inferolateral walls. LVEF 40-45% , previously 60-65% on the   echopcardiogram from 07/04/2016. _____________   History of Present Illness     67 y.o. male with h/o CAD, s/p CABG, HTN, dyslipidemia presented to the ED on 5/12 c/o CP that started earlier that evening. He said it was a midsternal discomfort that started suddenly at rest earlier that evening. It was associated with SOB; he called EMS, and EMS gave him albuterol MDI as well as NTG. Upon arrival to the ED, EMS was coding pt for [facility] and pulselessness. ED physician said the monitor showed appearance c/w possible torsades, and pt was shocked w/ restoration of SB. He had a recurrence of WCT subsequent to that episode that then resolved spontaneously. When assessed by the MD in the ED, he was ill-appearing and on NRB. Baseline EKG showed acute ST elevation in aVR and v1 with reciprocal changes. He was taken to the cath lab emergently.   Hospital Course     Underwent cath and found to have a thrombus in the saphenous vein graft to first obtuse marginal. The thrombus was aspirated and a stent was successfully placed. LV gram noted severely reduced LVEF. Placed on ASA/Brilinta for at least one year. Had a recurrence of WCT and placed on IV amiodarone. Was converted to oral dosing with 440m BID load. Blood pressures remained soft, therefore unable to add BB therapy. Troponin peaked at >65, then trended down. Follow up echo showed EF of 40-45% with hypokinesis in the basal and mid inferoseptal inferior and inferolateral walls. He was transferred from the ICU to stepdown on 5/13. Worked well with cardiac rehab without recurrence of chest  pain. LDL noted at 93, and placed on high dose statin. Hgb A1c was 7.7 and he was a known diabetic but was not following with anyone as an outpatient or checking his blood sugars at home. He was counseled on tobacco cessation. He was seen by the diabetes coordinator to provide education but reported he was not interested in discussion. Discussion was had with the patient and Dr. HEllyn Hackregarding starting Jardiance and following up as an outpatient with PCP. He was advised the need to find a PCP and voiced understanding. Blood pressures stabilized to add low dose Diovan 486mat the time of discharge. He was instructed to follow his blood pressures as an outpatient and bring to follow up. He will be discharged on an amiodarone taper of 40046mID x1 week, 200m44mD x2 weeks, then 200mg47mly. Will need to consider adding BB once taper is complete.   General: Well developed, well nourished, male appearing in no acute distress. Head: Normocephalic, atraumatic.  Neck: Supple without bruits, JVD. Lungs:  Resp regular and unlabored, CTA. Heart: RRR, S1, S2, no S3, S4, or murmur; no rub. Abdomen: Soft, non-tender, non-distended with normoactive bowel sounds.  No hepatomegaly. No rebound/guarding. No obvious abdominal masses. Extremities: No clubbing, cyanosis, edema. Distal pedal pulses are 2+ bilaterally. Neuro: Alert and oriented X 3. Moves all extremities spontaneously. Psych: Normal affect.  Matthew Moses was seen by Dr. Ellyn Hack and determined stable for discharge home. Follow up in the office has been arranged. Medications are listed below.   _____________  Discharge Vitals Blood pressure 108/71, pulse 65, temperature 97.8 F (36.6 C), temperature source Oral, resp. rate 19, height _0  (1.854 m), weight 232 lb 4.8 oz (105.4 kg), SpO2 100 %.  Filed Weights   04/03/18 0500 04/04/18 0143 04/05/18 0436  Weight: 232 lb (105.2 kg) 232 lb 1.6 oz (105.3 kg) 232 lb 4.8 oz (105.4 kg)    Labs & Radiologic  Studies    CBC Recent Labs    04/03/18 0352 04/04/18 1014  WBC 15.7* 13.6*  HGB 13.6 12.9*  HCT 38.9* 37.9*  MCV 87.6 87.9  PLT 176 762   Basic Metabolic Panel Recent Labs    04/04/18 1014  NA 133*  K 3.8  CL 96*  CO2 28  GLUCOSE 241*  BUN 16  CREATININE 1.02  CALCIUM 8.4*   Liver Function Tests Recent Labs    04/04/18 1014  AST 68*  ALT 50  ALKPHOS 57  BILITOT 2.3*  PROT 6.3*  ALBUMIN 3.3*   No results for input(s): LIPASE, AMYLASE in the last 72 hours. Cardiac Enzymes Recent Labs    04/02/18 2130 04/03/18 0254 04/03/18 0937  TROPONINI 48.73* 37.12* 17.40*   BNP Invalid input(s): POCBNP D-Dimer No results for input(s): DDIMER in the last 72 hours. Hemoglobin A1C No results for input(s): HGBA1C in the last 72 hours. Fasting Lipid Panel Recent Labs    04/03/18 0254  CHOL 140  HDL 27*  LDLCALC 93  TRIG 99  CHOLHDL 5.2   Thyroid Function Tests No results for input(s): TSH, T4TOTAL, T3FREE, THYROIDAB in the last 72 hours.  Invalid input(s): FREET3 _____________  Dg Chest Portable 1 View  Result Date: 04/02/2018 CLINICAL DATA:  Shortness of breath, chest pain. Extensive cardiac history. EXAM: PORTABLE CHEST 1 VIEW COMPARISON:  None. FINDINGS: Cardiomediastinal silhouette is normal. Mildly calcified aortic arch. Status post median sternotomy. Interstitial prominence without pleural effusion or focal consolidation. No pneumothorax. Soft tissue planes and included osseous structures are nonsuspicious. IMPRESSION: Mild interstitial prominence seen with pulmonary edema or atypical infection, potentially chronic. Aortic Atherosclerosis (ICD10-I70.0). Electronically Signed   By: Elon Alas M.D.   On: 04/02/2018 02:35   Disposition   Pt is being discharged home today in good condition.  Follow-up Plans & Appointments    Follow-up Information    Erlene Quan, PA-C Follow up on 04/11/2018.   Specialties:  Cardiology, Radiology Why:  at 11:30am  for your follow up appt.  Contact information: [address] [place] [address] [phone]        Primary Care MD Follow up.   Why:  You need to establish with a primary care provider to follow your diabetes within the next couple of weeks.          Discharge Instructions    AMB Referral to Cardiac Rehabilitation - Phase II   Complete by:  As directed    Diagnosis:  Coronary Stents   Amb Referral to Cardiac Rehabilitation   Complete by:  As directed    Diagnosis:   Coronary Stents STEMI     Diet - low sodium heart healthy   Complete  by:  As directed    Discharge instructions   Complete by:  As directed    Groin Site Care Refer to this sheet in the next few weeks. These instructions provide you with information on caring for yourself after your procedure. Your caregiver may also give you more specific instructions. Your treatment has been planned according to current medical practices, but problems sometimes occur. Call your caregiver if you have any problems or questions after your procedure. HOME CARE INSTRUCTIONS You may shower 24 hours after the procedure. Remove the bandage (dressing) and gently wash the site with plain soap and water. Gently pat the site dry.  Do not apply powder or lotion to the site.  Do not sit in a bathtub, swimming pool, or whirlpool for 5 to 7 days.  No bending, squatting, or lifting anything over 10 pounds (4.5 kg) as directed by your caregiver.  Inspect the site at least twice daily.  Do not drive home if you are discharged the same day of the procedure. Have someone else drive you.  You may drive 24 hours after the procedure unless otherwise instructed by your caregiver.  What to expect: Any bruising will usually fade within 1 to 2 weeks.  Blood that collects in the tissue (hematoma) may be painful to the touch. It should usually decrease in size and tenderness within 1 to 2 weeks.  SEEK IMMEDIATE MEDICAL CARE IF: You have  unusual pain at the groin site or down the affected leg.  You have redness, warmth, swelling, or pain at the groin site.  You have drainage (other than a small amount of blood on the dressing).  You have chills.  You have a fever or persistent symptoms for more than 72 hours.  You have a fever and your symptoms suddenly get worse.  Your leg becomes pale, cool, tingly, or numb.  You have heavy bleeding from the site. Hold pressure on the site. [name] Kitchen  PLEASE DO NOT MISS ANY DOSES OF YOUR BRILINTA!!!!! Also keep a log of you blood pressures and bring back to your follow up appt. Please call the office with any questions.   Patients taking blood thinners should generally stay away from medicines like ibuprofen, Advil, Motrin, naproxen, and Aleve due to risk of stomach bleeding. You may take Tylenol as directed or talk to your primary doctor about alternatives.  With your new diabetic medication, please make sure to stay hydrated.   Increase activity slowly   Complete by:  As directed       Discharge Medications     Medication List    STOP taking these medications   aspirin 325 MG EC tablet Replaced by:  aspirin 81 MG chewable tablet   insulin glargine 100 UNIT/ML injection Commonly known as:  LANTUS   naproxen 500 MG tablet Commonly known as:  NAPROSYN   promethazine 25 MG tablet Commonly known as:  PHENERGAN     TAKE these medications   amiodarone 200 MG tablet Commonly known as:  PACERONE Take 459m (2 tablets) twice daily for 5 days, then 2052m(1 tablet) twice daily for 2 weeks, then 20035m1 tablet) daily.   aspirin 81 MG chewable tablet Chew 1 tablet (81 mg total) by mouth daily. Start taking on:  04/06/2018 Replaces:  aspirin 325 MG EC tablet   atorvastatin 80 MG tablet Commonly known as:  LIPITOR Take 1 tablet (80 mg total) by mouth daily at 6 PM. What changed:    medication strength  how much to take   blood glucose meter kit and supplies Dispense based on  patient and insurance preference. Use up to four times daily as directed. (FOR ICD-9 250.00, 250.01).   empagliflozin 10 MG Tabs tablet Commonly known as:  JARDIANCE Take 10 mg by mouth daily.   nitroGLYCERIN 0.4 MG SL tablet Commonly known as:  NITROSTAT Place 1 tablet (0.4 mg total) under the tongue every 5 (five) minutes as needed.   onetouch ultrasoft lancets Use as instructed   ticagrelor 90 MG Tabs tablet Commonly known as:  BRILINTA Take 1 tablet (90 mg total) by mouth 2 (two) times daily.   valsartan 40 MG tablet Commonly known as:  DIOVAN Take 1 tablet (40 mg total) by mouth daily.        Aspirin prescribed at discharge?  Yes High Intensity Statin Prescribed? (Lipitor 40-9m or Crestor 20-455m: Yes Beta Blocker Prescribed? No: Blood pressure to soft For EF <40%, was ACEI/ARB Prescribed? Yes ADP Receptor Inhibitor Prescribed? (i.e. Plavix etc.-Includes Medically Managed Patients): Yes For EF <40%, Aldosterone Inhibitor Prescribed? No: EF ok Was EF assessed during THIS hospitalization? Yes Was Cardiac Rehab II ordered? (Included Medically managed Patients): Yes   Outstanding Labs/Studies   FLP/LFTs in 6 weeks. BMET at follow up.  Duration of Discharge Encounter   Greater than 30 minutes including physician time.  Signed, LiReino BellisP-C 04/05/2018, 11:11 AM

## 2018-04-05 NOTE — Discharge Instructions (Signed)
Heart-Healthy Eating Plan Heart-healthy meal planning includes:  Limiting unhealthy fats.  Increasing healthy fats.  Making other small dietary changes.  You may need to talk with your doctor or a diet specialist (dietitian) to create an eating plan that is right for you. What types of fat should I choose?  Choose healthy fats. These include olive oil and canola oil, flaxseeds, walnuts, almonds, and seeds.  Eat more omega-3 fats. These include salmon, mackerel, sardines, tuna, flaxseed oil, and ground flaxseeds. Try to eat fish at least twice each week.  Limit saturated fats. ? Saturated fats are often found in animal products, such as meats, butter, and cream. ? Plant sources of saturated fats include palm oil, palm kernel oil, and coconut oil.  Avoid foods with partially hydrogenated oils in them. These include stick margarine, some tub margarines, cookies, crackers, and other baked goods. These contain trans fats. What general guidelines do I need to follow?  Check food labels carefully. Identify foods with trans fats or high amounts of saturated fat.  Fill one half of your plate with vegetables and green salads. Eat 4-5 servings of vegetables per day. A serving of vegetables is: ? 1 cup of raw leafy vegetables. ?  cup of raw or cooked cut-up vegetables. ?  cup of vegetable juice.  Fill one fourth of your plate with whole grains. Look for the word "whole" as the first word in the ingredient list.  Fill one fourth of your plate with lean protein foods.  Eat 4-5 servings of fruit per day. A serving of fruit is: ? One medium whole fruit. ?  cup of dried fruit. ?  cup of fresh, frozen, or canned fruit. ?  cup of 100% fruit juice.  Eat more foods that contain soluble fiber. These include apples, broccoli, carrots, beans, peas, and barley. Try to get 20-30 g of fiber per day.  Eat more home-cooked food. Eat less restaurant, buffet, and fast food.  Limit or avoid  alcohol.  Limit foods high in starch and sugar.  Avoid fried foods.  Avoid frying your food. Try baking, boiling, grilling, or broiling it instead. You can also reduce fat by: ? Removing the skin from poultry. ? Removing all visible fats from meats. ? Skimming the fat off of stews, soups, and gravies before serving them. ? Steaming vegetables in water or broth.  Lose weight if you are overweight.  Eat 4-5 servings of nuts, legumes, and seeds per week: ? One serving of dried beans or legumes equals  cup after being cooked. ? One serving of nuts equals 1 ounces. ? One serving of seeds equals  ounce or one tablespoon.  You may need to keep track of how much salt or sodium you eat. This is especially true if you have high blood pressure. Talk with your doctor or dietitian to get more information. What foods can I eat? Grains Breads, including [place], white, pita, wheat, raisin, rye, oatmeal, and [place]. Tortillas that are neither fried nor made with lard or trans fat. Low-fat rolls, including hotdog and hamburger buns and English muffins. Biscuits. Muffins. Waffles. Pancakes. Light popcorn. Whole-grain cereals. Flatbread. Melba toast. Pretzels. Breadsticks. Rusks. Low-fat snacks. Low-fat crackers, including oyster, saltine, matzo, graham, animal, and rye. Rice and pasta, including brown rice and pastas that are made with whole wheat. Vegetables All vegetables. Fruits All fruits, but limit coconut. Meats and Other Protein Sources Lean, well-trimmed beef, veal, pork, and lamb. Chicken and [place] without skin. All fish and shellfish.  Wild duck, rabbit, pheasant, and venison. Egg whites or low-cholesterol egg substitutes. Dried beans, peas, lentils, and tofu. Seeds and most nuts. Dairy Low-fat or nonfat cheeses, including ricotta, string, and mozzarella. Skim or 1% milk that is liquid, powdered, or evaporated. Buttermilk that is made with low-fat milk. Nonfat or low-fat  yogurt. Beverages Mineral water. Diet carbonated beverages. Sweets and Desserts Sherbets and fruit ices. Honey, jam, marmalade, jelly, and syrups. Meringues and gelatins. Pure sugar candy, such as hard candy, jelly beans, gumdrops, mints, marshmallows, and small amounts of dark chocolate. [employer]. Eat all sweets and desserts in moderation. Fats and Oils Nonhydrogenated (trans-free) margarines. Vegetable oils, including soybean, sesame, sunflower, olive, peanut, safflower, corn, canola, and cottonseed. Salad dressings or mayonnaise made with a vegetable oil. Limit added fats and oils that you use for cooking, baking, salads, and as spreads. Other Cocoa powder. Coffee and tea. All seasonings and condiments. The items listed above may not be a complete list of recommended foods or beverages. Contact your dietitian for more options. What foods are not recommended? Grains Breads that are made with saturated or trans fats, oils, or whole milk. Croissants. Butter rolls. Cheese breads. Sweet rolls. Donuts. Buttered popcorn. Chow mein noodles. High-fat crackers, such as cheese or butter crackers. Meats and Other Protein Sources Fatty meats, such as hotdogs, short ribs, sausage, spareribs, bacon, rib eye roast or steak, and mutton. High-fat deli meats, such as salami and bologna. Caviar. Domestic duck and goose. Organ meats, such as kidney, liver, sweetbreads, and heart. Dairy Cream, sour cream, cream cheese, and creamed cottage cheese. Whole-milk cheeses, including blue (bleu), [address], [place], [place], [address], [place], [address], cheddar, [place], and [place]. Whole or 2% milk that is liquid, evaporated, or condensed. Whole buttermilk. Cream sauce or high-fat cheese sauce. Yogurt that is made from whole milk. Beverages Regular sodas and juice drinks with added sugar. Sweets and Desserts Frosting. Pudding. Cookies. Cakes other than angel food cake. Candy that has milk chocolate or white  chocolate, hydrogenated fat, butter, coconut, or unknown ingredients. Buttered syrups. Full-fat ice cream or ice cream drinks. Fats and Oils Gravy that has suet, meat fat, or shortening. Cocoa butter, hydrogenated oils, palm oil, coconut oil, palm kernel oil. These can often be found in baked products, candy, fried foods, nondairy creamers, and whipped toppings. Solid fats and shortenings, including bacon fat, salt pork, lard, and butter. Nondairy cream substitutes, such as coffee creamers and sour cream substitutes. Salad dressings that are made of unknown oils, cheese, or sour cream. The items listed above may not be a complete list of foods and beverages to avoid. Contact your dietitian for more information. This information is not intended to replace advice given to you by your health care provider. Make sure you discuss any questions you have with your health care provider. Document Released: 05/09/2012 Document Revised: 04/15/2016 Document Reviewed: 05/02/2014 Elsevier Interactive Patient Education  2018 Elsevier Inc.    Femoral Site Care Refer to this sheet in the next few weeks. These instructions provide you with information about caring for yourself after your procedure. Your health care provider may also give you more specific instructions. Your treatment has been planned according to current medical practices, but problems sometimes occur. Call your health care provider if you have any problems or questions after your procedure. What can I expect after the procedure? After your procedure, it is typical to have the following:  Bruising at the site that usually fades within 1-2 weeks.  Blood collecting in  the tissue (hematoma) that may be painful to the touch. It should usually decrease in size and tenderness within 1-2 weeks.  Follow these instructions at home:  Take medicines only as directed by your health care provider.  You may shower 24-48 hours after the procedure or as  directed by your health care provider. Remove the bandage (dressing) and gently wash the site with plain soap and water. Pat the area dry with a clean towel. Do not rub the site, because this may cause bleeding.  Do not take baths, swim, or use a hot tub until your health care provider approves.  Check your insertion site every day for redness, swelling, or drainage.  Do not apply powder or lotion to the site.  Limit use of stairs to twice a day for the first 2-3 days or as directed by your health care provider.  Do not squat for the first 2-3 days or as directed by your health care provider.  Do not lift over 10 lb (4.5 kg) for 5 days after your procedure or as directed by your health care provider.  Ask your health care provider when it is okay to: ? Return to work or school. ? Resume usual physical activities or sports. ? Resume sexual activity.  Do not drive home if you are discharged the same day as the procedure. Have someone else drive you.  You may drive 24 hours after the procedure unless otherwise instructed by your health care provider.  Do not operate machinery or power tools for 24 hours after the procedure or as directed by your health care provider.  If your procedure was done as an outpatient procedure, which means that you went home the same day as your procedure, a responsible adult should be with you for the first 24 hours after you arrive home.  Keep all follow-up visits as directed by your health care provider. This is important. Contact a health care provider if:  You have a fever.  You have chills.  You have increased bleeding from the site. Hold pressure on the site. Get help right away if:  You have unusual pain at the site.  You have redness, warmth, or swelling at the site.  You have drainage (other than a small amount of blood on the dressing) from the site.  The site is bleeding, and the bleeding does not stop after 30 minutes of holding steady  pressure on the site.  Your leg or foot becomes pale, cool, tingly, or numb. This information is not intended to replace advice given to you by your health care provider. Make sure you discuss any questions you have with your health care provider. Document Released: 07/12/2014 Document Revised: 04/15/2016 Document Reviewed: 05/28/2014 Elsevier Interactive Patient Education  [employer].

## 2018-04-05 NOTE — Telephone Encounter (Signed)
Current admit 

## 2018-04-05 NOTE — Telephone Encounter (Signed)
Matthew Moses    Date: 04/11/2018    Time: 11:30 AM    Visit Type: OFFICE VISIT 30 [368]    Provider: Abelino Derrick, PA-C    Referring Provider:     Notes: TOC LINDSAY/okay to take 72hour slot [place]  Made On: 04/05/2018 10:03 AM

## 2018-04-06 NOTE — Telephone Encounter (Signed)
Left message for pt to call.

## 2018-04-07 NOTE — Telephone Encounter (Signed)
LMTCB

## 2018-04-10 NOTE — Telephone Encounter (Signed)
Third attempt-left message to call back.

## 2018-04-11 ENCOUNTER — Encounter: Payer: Self-pay | Admitting: Cardiology

## 2018-04-11 ENCOUNTER — Ambulatory Visit (INDEPENDENT_AMBULATORY_CARE_PROVIDER_SITE_OTHER): Payer: Medicaid Other | Admitting: Cardiology

## 2018-04-11 DIAGNOSIS — Z9861 Coronary angioplasty status: Secondary | ICD-10-CM | POA: Diagnosis not present

## 2018-04-11 DIAGNOSIS — I2129 ST elevation (STEMI) myocardial infarction involving other sites: Secondary | ICD-10-CM

## 2018-04-11 DIAGNOSIS — I2510 Atherosclerotic heart disease of native coronary artery without angina pectoris: Secondary | ICD-10-CM | POA: Diagnosis not present

## 2018-04-11 DIAGNOSIS — Z8673 Personal history of transient ischemic attack (TIA), and cerebral infarction without residual deficits: Secondary | ICD-10-CM | POA: Insufficient documentation

## 2018-04-11 DIAGNOSIS — I10 Essential (primary) hypertension: Secondary | ICD-10-CM | POA: Diagnosis not present

## 2018-04-11 DIAGNOSIS — R0602 Shortness of breath: Secondary | ICD-10-CM | POA: Diagnosis not present

## 2018-04-11 DIAGNOSIS — I472 Ventricular tachycardia, unspecified: Secondary | ICD-10-CM

## 2018-04-11 DIAGNOSIS — Z951 Presence of aortocoronary bypass graft: Secondary | ICD-10-CM | POA: Diagnosis not present

## 2018-04-11 DIAGNOSIS — E118 Type 2 diabetes mellitus with unspecified complications: Secondary | ICD-10-CM | POA: Diagnosis not present

## 2018-04-11 DIAGNOSIS — R0781 Pleurodynia: Secondary | ICD-10-CM

## 2018-04-11 DIAGNOSIS — E785 Hyperlipidemia, unspecified: Secondary | ICD-10-CM

## 2018-04-11 DIAGNOSIS — I255 Ischemic cardiomyopathy: Secondary | ICD-10-CM

## 2018-04-11 MED ORDER — METFORMIN HCL 500 MG PO TABS: 500.0000 mg | ORAL_TABLET | Freq: Two times a day (BID) | ORAL | 4 refills | Status: DC

## 2018-04-11 NOTE — Assessment & Plan Note (Signed)
Pt discharged on Amiodarone 

## 2018-04-11 NOTE — Assessment & Plan Note (Signed)
Controlled. Grade 2 DD on echo with mild LVH

## 2018-04-11 NOTE — Patient Instructions (Signed)
Medication Instructions:  Start: Metformin 500 mg two times a day  Labwork: None  Testing/Procedures: Non-Cardiac CT scanning, (CAT scanning), is a noninvasive, special x-ray that produces cross-sectional images of the body using x-rays and a computer. CT scans help physicians diagnose and treat medical conditions. For some CT exams, a contrast material is used to enhance visibility in the area of the body being studied. CT scans provide greater clarity and reveal more details than regular x-ray exams.     Follow-Up: Your physician recommends that you schedule a follow-up appointment in: 2 months with [name], PA   Any Other Special Instructions Will Be Listed Below (If Applicable).     If you need a refill on your cardiac medications before your next appointment, please call your pharmacy.

## 2018-04-11 NOTE — Assessment & Plan Note (Signed)
Pt complains of Rt sided pleuritic chest pain- r/o PE

## 2018-04-11 NOTE — Assessment & Plan Note (Signed)
Pt could not afford Jardiance- Placed on Glucophage

## 2018-04-11 NOTE — Progress Notes (Signed)
04/11/2018 Matthew Moses   21-Apr-1951  [id number]  Primary Physician System, Provider Not In Primary Cardiologist: Dr Herbie Baltimore  HPI:  67 y/o male, (looks older), with a history of CABG x 2 in [place] in 2012- no details available. He admits he hasn't taken any medication "in years". In 2017 he had a Lt brain stroke treated with TPA. He has trouble witting but otherwise recovered. He developed chest pain and SOB 04/05/18. He actually had syncope at home and woke back up and called 911. When he arrived in the ED he was being coded. He was in wide complex rhythm and unresponsive. He was shocked to NSR and placed on Amiodarone. With resumption of NSR his EKG showed acute ST elevation in AVR and V1. He was taken urgently to the cath lab. He was found to have thrombus in the SVG-LPDA which was treated with a DES. His LIMA-LAD was patent with distal LAD disease and his unprotected native RCA was 99% occluded proximally. The pt's EF at cath was 30%. He did well post PCI. Medications were added including treatment for his DM. A f/u echo showed improvement in his LVF to 40-45%. He is in the office today for follow up. He has not had recurrent angina. He does complain of pleuritic Rt sided chest pain and some SOB. Post cath there was a problem with obtaining hemostasis of his Rt groin and he has extensive bruising down his Rt leg to his ankle.    Current Outpatient Medications  Medication Sig Dispense Refill  . amiodarone (PACERONE) 200 MG tablet Take 400mg  (2 tablets) twice daily for 5 days, then 200mg  (1 tablet) twice daily for 2 weeks, then 200mg  (1 tablet) daily. 60 tablet 0  . aspirin 81 MG chewable tablet Chew 1 tablet (81 mg total) by mouth daily.    atorvastatin (LIPITOR) 80 MG tablet Take 1 tablet (80 mg total) by mouth daily at 6 PM. 90 tablet 1  . empagliflozin (JARDIANCE) 10 MG TABS tablet Take 10 mg by mouth daily. 30 tablet 1  . Lancets (ONETOUCH ULTRASOFT) lancets Use as instructed 100  each 12  . nitroGLYCERIN (NITROSTAT) 0.4 MG SL tablet Place 1 tablet (0.4 mg total) under the tongue every 5 (five) minutes as needed. 25 tablet 2  . ticagrelor (BRILINTA) 90 MG TABS tablet Take by mouth 2 (two) times daily.    . valsartan (DIOVAN) 40 MG tablet Take 1 tablet (40 mg total) by mouth daily. 30 tablet 2  . metFORMIN (GLUCOPHAGE) 500 MG tablet Take 1 tablet (500 mg total) by mouth 2 (two) times daily with a meal. 180 tablet 4   No current facility-administered medications for this visit.     Allergies  Allergen Reactions  . Codeine Hives  . Penicillins Itching and Rash    Has patient had a PCN reaction causing immediate rash, facial/tongue/throat swelling, SOB or lightheadedness with hypotension: Yes Has patient had a PCN reaction causing severe rash involving mucus membranes or skin necrosis: No Has patient had a PCN reaction that required hospitalization: No Has patient had a PCN reaction occurring within the last 10 years: No If all of the above answers are "NO", then may proceed with Cephalosporin use.     Past Medical History:  Diagnosis Date  . CAD, multiple vessel    Severe native CAD involving left main, LAD, RCA, ramus intermedius and PDA -> status post CABG x2 (LIMA-LAD, SVG-L PDA)  . Coronary artery disease involving coronary  bypass graft of native heart with angina pectoris (HCC) - thrombotic 95% oclusion of SVG-LPDA 04/03/2018   occluded LAD, subtotally occluded nondominant to codominant RCA, LCx & RI.  LIMA-LAD was intact. CULPRIT LESION was the SVG-dLPLA that had a high-grade proximal thrombotic stenosis.  S/P successful PCI of SVG-LPDA stented his vein graft with a 3.5 mm drug-eluting stent using distal protection.  [name] Kitchen Hx of CABG   . Hyperlipidemia associated with type 2 diabetes mellitus (HCC)   . Hypertension   . Tobacco abuse   . Type II diabetes mellitus with complication (HCC)    History of stroke and CAD-CABG    Social History   Socioeconomic  History  . Marital status: Single    Spouse name: Not on file  . Number of children: Not on file  . Years of education: Not on file  . Highest education level: Not on file  Occupational History  . Not on file  Social Needs  . Financial resource strain: Not on file  . Food insecurity:    Worry: Not on file    Inability: Not on file  . Transportation needs:    Medical: Not on file    Non-medical: Not on file  Tobacco Use  . Smoking status: Current Every Day Smoker  . Smokeless tobacco: Never Used  Substance and Sexual Activity  . Alcohol use: No  . Drug use: Yes    Types: Marijuana  . Sexual activity: Not on file  Lifestyle  . Physical activity:    Days per week: Not on file    Minutes per session: Not on file  . Stress: Not on file  Relationships  . Social connections:    Talks on phone: Not on file    Gets together: Not on file    Attends religious service: Not on file    Active member of club or organization: Not on file    Attends meetings of clubs or organizations: Not on file    Relationship status: Not on file  . Intimate partner violence:    Fear of current or ex partner: Not on file    Emotionally abused: Not on file    Physically abused: Not on file    Forced sexual activity: Not on file  Other Topics Concern  . Not on file  Social History Narrative  . Not on file     History reviewed. No pertinent family history.   Review of Systems: General: negative for chills, fever, night sweats or weight changes.  Cardiovascular: negative for orthopnea, palpitations, paroxysmal nocturnal dyspnea  Dermatological: negative for rash Respiratory: negative for cough or wheezing Urologic: negative for hematuria Abdominal: negative for nausea, vomiting, diarrhea, bright red blood per rectum, melena, or hematemesis Neurologic: negative for visual changes, syncope, or dizziness All other systems reviewed and are otherwise negative except as noted above.    Blood  pressure 118/62, height 6\' 1"  (1.854 m), weight 239 lb 3.2 oz (108.5 kg).  General appearance: alert, cooperative, appears older than stated age, no distress and mildly obese Neck: no carotid bruit and no JVD Lungs: clear to auscultation bilaterally Heart: regular rate and rhythm Extremities: extensive Rt grion to ankle ecchymosis, no hematoma Skin: Skin color, texture, turgor normal. No rashes or lesions Neurologic: Grossly normal  EKG NSR, LAD  ASSESSMENT AND PLAN:   Acute ST elevation myocardial infarction (STEMI) of posterior wall (HCC) S/P acute MI with cardiac arrest 04/02/18 and VT requiring cardioversion  Hx of CABG S/P CABG  in 2012 [place]- no records  CAD S/P percutaneous coronary angioplasty S/P CABG in 2012 [place]- no records  Ventricular tachycardia, sustained (HCC) -n setting of STEMI Pt discharged on Amiodarone  Diabetes mellitus type 2, controlled, with complications (HCC) Pt could not afford Jardiance- Placed on Glucophage  Ischemic cardiomyopathy - EF 30% by LV gram, improved to 40-45% by post MI echo  History of CVA (cerebrovascular accident) Lt brain CVA Aug 2017- TPA  Essential hypertension Controlled. Grade 2 DD on echo with mild LVH  Hyperlipidemia LDL goal <70 Placed on high dose statin Rx  Pleuritic chest pain Pt complains of Rt sided pleuritic chest pain- r/o PE   PLAN  The pt tells me he cannot afford the Jardiance prescribed and I'll place him on Glucophage 500 mg BID. He is having trouble;e finding a PCP to take Medicaid and we will assist him in this.   I'm concerned about his pleuritic chest pain and extensive ecchymosis in his Rt leg. I have suggested a chest CTA be done. He did not want this done today though that was my recommendation. He says he will go for it tomorrow. F/U in a few weeks. For now medical Rx for his unprotected RCA  [name] PA-C 04/11/2018 1:13 PM

## 2018-04-11 NOTE — Assessment & Plan Note (Signed)
EF 30% by LV gram, improved to 40-45% by post MI echo 

## 2018-04-11 NOTE — Assessment & Plan Note (Signed)
Placed on high dose statin Rx

## 2018-04-11 NOTE — Assessment & Plan Note (Signed)
Lt brain CVA Aug 2017- TPA

## 2018-04-11 NOTE — Assessment & Plan Note (Signed)
S/P CABG in 2012 [place]- no records 

## 2018-04-11 NOTE — Assessment & Plan Note (Signed)
S/P CABG in 2012 [place]- no records

## 2018-04-11 NOTE — Assessment & Plan Note (Signed)
S/P acute MI with cardiac arrest 04/02/18 and VT requiring cardioversion 

## 2018-04-12 ENCOUNTER — Ambulatory Visit (HOSPITAL_COMMUNITY)
Admission: RE | Admit: 2018-04-12 | Discharge: 2018-04-12 | Disposition: A | Discharge status: Home / Self Care | Payer: Medicaid Other | Source: Ambulatory Visit | Attending: Cardiology | Admitting: Cardiology

## 2018-04-12 ENCOUNTER — Encounter (HOSPITAL_COMMUNITY): Payer: Self-pay

## 2018-04-12 DIAGNOSIS — R0602 Shortness of breath: Secondary | ICD-10-CM | POA: Insufficient documentation

## 2018-04-12 DIAGNOSIS — E279 Disorder of adrenal gland, unspecified: Secondary | ICD-10-CM | POA: Insufficient documentation

## 2018-04-12 DIAGNOSIS — I700 Atherosclerosis of aorta: Secondary | ICD-10-CM | POA: Diagnosis not present

## 2018-04-12 MED ORDER — IOPAMIDOL (ISOVUE-370) INJECTION 76%
100.0000 mL | Freq: Once | INTRAVENOUS | Status: AC | PRN
  Administered 2018-04-12: 100 mL via INTRAVENOUS

## 2018-04-18 ENCOUNTER — Emergency Department (HOSPITAL_BASED_OUTPATIENT_CLINIC_OR_DEPARTMENT_OTHER)
Admission: EM | Admit: 2018-04-18 | Discharge: 2018-04-18 | Disposition: A | Discharge status: Home / Self Care | Payer: Medicaid Other | Attending: Emergency Medicine | Admitting: Emergency Medicine

## 2018-04-18 ENCOUNTER — Emergency Department (HOSPITAL_BASED_OUTPATIENT_CLINIC_OR_DEPARTMENT_OTHER)
Admission: RE | Admit: 2018-04-18 | Discharge: 2018-04-18 | Disposition: A | Discharge status: Home / Self Care | Payer: Medicaid Other | Source: Ambulatory Visit | Attending: Emergency Medicine | Admitting: Emergency Medicine

## 2018-04-18 ENCOUNTER — Emergency Department (HOSPITAL_BASED_OUTPATIENT_CLINIC_OR_DEPARTMENT_OTHER): Payer: Medicaid Other

## 2018-04-18 ENCOUNTER — Other Ambulatory Visit: Payer: Self-pay

## 2018-04-18 ENCOUNTER — Encounter: Payer: Self-pay | Admitting: *Deleted

## 2018-04-18 ENCOUNTER — Encounter (HOSPITAL_BASED_OUTPATIENT_CLINIC_OR_DEPARTMENT_OTHER): Payer: Self-pay | Admitting: Emergency Medicine

## 2018-04-18 DIAGNOSIS — Y999 Unspecified external cause status: Secondary | ICD-10-CM | POA: Diagnosis not present

## 2018-04-18 DIAGNOSIS — Z8673 Personal history of transient ischemic attack (TIA), and cerebral infarction without residual deficits: Secondary | ICD-10-CM | POA: Diagnosis not present

## 2018-04-18 DIAGNOSIS — M79609 Pain in unspecified limb: Secondary | ICD-10-CM

## 2018-04-18 DIAGNOSIS — X58XXXA Exposure to other specified factors, initial encounter: Secondary | ICD-10-CM | POA: Diagnosis not present

## 2018-04-18 DIAGNOSIS — Z9861 Coronary angioplasty status: Secondary | ICD-10-CM | POA: Diagnosis not present

## 2018-04-18 DIAGNOSIS — I2510 Atherosclerotic heart disease of native coronary artery without angina pectoris: Secondary | ICD-10-CM | POA: Insufficient documentation

## 2018-04-18 DIAGNOSIS — M7989 Other specified soft tissue disorders: Secondary | ICD-10-CM

## 2018-04-18 DIAGNOSIS — I10 Essential (primary) hypertension: Secondary | ICD-10-CM | POA: Insufficient documentation

## 2018-04-18 DIAGNOSIS — Z951 Presence of aortocoronary bypass graft: Secondary | ICD-10-CM | POA: Diagnosis not present

## 2018-04-18 DIAGNOSIS — M79604 Pain in right leg: Secondary | ICD-10-CM | POA: Insufficient documentation

## 2018-04-18 DIAGNOSIS — S8011XA Contusion of right lower leg, initial encounter: Secondary | ICD-10-CM | POA: Diagnosis not present

## 2018-04-18 DIAGNOSIS — Y9389 Activity, other specified: Secondary | ICD-10-CM | POA: Insufficient documentation

## 2018-04-18 DIAGNOSIS — F17200 Nicotine dependence, unspecified, uncomplicated: Secondary | ICD-10-CM | POA: Insufficient documentation

## 2018-04-18 DIAGNOSIS — Y92234 Operating room of hospital as the place of occurrence of the external cause: Secondary | ICD-10-CM | POA: Insufficient documentation

## 2018-04-18 DIAGNOSIS — Z7982 Long term (current) use of aspirin: Secondary | ICD-10-CM | POA: Diagnosis not present

## 2018-04-18 DIAGNOSIS — E119 Type 2 diabetes mellitus without complications: Secondary | ICD-10-CM | POA: Diagnosis not present

## 2018-04-18 DIAGNOSIS — Y92239 Unspecified place in hospital as the place of occurrence of the external cause: Secondary | ICD-10-CM | POA: Diagnosis not present

## 2018-04-18 DIAGNOSIS — Z7984 Long term (current) use of oral hypoglycemic drugs: Secondary | ICD-10-CM | POA: Diagnosis not present

## 2018-04-18 LAB — CBC WITH DIFFERENTIAL/PLATELET
BASOS ABS: 0.1 10*3/uL (ref 0.0–0.1)
Basophils Relative: 1 %
Eosinophils Absolute: 0.3 10*3/uL (ref 0.0–0.7)
Eosinophils Relative: 3 %
HEMATOCRIT: 39.4 % (ref 39.0–52.0)
HEMOGLOBIN: 13.9 g/dL (ref 13.0–17.0)
LYMPHS PCT: 22 %
Lymphs Abs: 2.3 10*3/uL (ref 0.7–4.0)
MCH: 31.5 pg (ref 26.0–34.0)
MCHC: 35.3 g/dL (ref 30.0–36.0)
MCV: 89.3 fL (ref 78.0–100.0)
MONO ABS: 1 10*3/uL (ref 0.1–1.0)
Monocytes Relative: 9 %
NEUTROS ABS: 6.8 10*3/uL (ref 1.7–7.7)
NEUTROS PCT: 65 %
Platelets: 281 10*3/uL (ref 150–400)
RBC: 4.41 MIL/uL (ref 4.22–5.81)
RDW: 14.4 % (ref 11.5–15.5)
WBC: 10.5 10*3/uL (ref 4.0–10.5)

## 2018-04-18 LAB — BASIC METABOLIC PANEL
ANION GAP: 9 (ref 5–15)
BUN: 15 mg/dL (ref 6–20)
CO2: 24 mmol/L (ref 22–32)
Calcium: 8.8 mg/dL — ABNORMAL LOW (ref 8.9–10.3)
Chloride: 101 mmol/L (ref 101–111)
Creatinine, Ser: 0.84 mg/dL (ref 0.61–1.24)
GFR calc Af Amer: >60 mL/min (ref >60)
GFR calc non Af Amer: >60 mL/min (ref >60)
GLUCOSE: 192 mg/dL — AB (ref 65–99)
POTASSIUM: 4.1 mmol/L (ref 3.5–5.1)
Sodium: 134 mmol/L — ABNORMAL LOW (ref 135–145)

## 2018-04-18 LAB — CBG MONITORING, ED
GLUCOSE-CAPILLARY: 172 mg/dL — AB (ref 65–99)
Glucose-Capillary: 200 mg/dL — ABNORMAL HIGH (ref 65–99)

## 2018-04-18 MED ORDER — OXYCODONE-ACETAMINOPHEN 5-325 MG PO TABS
1.0000 | ORAL_TABLET | Freq: Once | ORAL | Status: AC
  Administered 2018-04-18: 1 via ORAL
  Filled 2018-04-18: qty 1

## 2018-04-18 MED ORDER — ONDANSETRON HCL 4 MG/2ML IJ SOLN
4.0000 mg | Freq: Once | INTRAMUSCULAR | Status: AC
  Administered 2018-04-18: 4 mg via INTRAVENOUS
  Filled 2018-04-18: qty 2

## 2018-04-18 MED ORDER — FUROSEMIDE 20 MG PO TABS: 20.0000 mg | ORAL_TABLET | Freq: Every day | ORAL | 0 refills | Status: DC

## 2018-04-18 MED ORDER — OXYCODONE-ACETAMINOPHEN 5-325 MG PO TABS: 1.0000 | ORAL_TABLET | Freq: Four times a day (QID) | ORAL | 0 refills | Status: AC | PRN

## 2018-04-18 MED ORDER — FENTANYL CITRATE (PF) 100 MCG/2ML IJ SOLN
50.0000 ug | Freq: Once | INTRAMUSCULAR | Status: AC
  Administered 2018-04-18: 50 ug via INTRAVENOUS
  Filled 2018-04-18: qty 2

## 2018-04-18 MED ORDER — FUROSEMIDE 20 MG PO TABS
20.0000 mg | ORAL_TABLET | Freq: Once | ORAL | Status: AC
  Administered 2018-04-18: 20 mg via ORAL
  Filled 2018-04-18: qty 1

## 2018-04-18 NOTE — Progress Notes (Signed)
*  PRELIMINARY RESULTS* Vascular Ultrasound Limited Right Lower Extremity Arterial Duplex has been completed. There is no obvious evidence of right common femoral artery pseudoaneurysm. There are multiple heterogenous areas of the right groin, suggestive of possible hematomas versus unknown etiology. There is a small, linear, vascularized area with low resistant flow extending anteriorly from the area of the common femoral artery, suggestive of a possible collateralization versus needle tract post catheterization versus unknown etiology. Distal posterior tibial artery was briefly evaluated for completeness and was found to be patent with triphasic flow.  04/18/2018 12:54 PM Gertie Fey, BS, RVT, RDCS, RDMS

## 2018-04-18 NOTE — ED Provider Notes (Signed)
[facility]-EMERGENCY DEPT Provider Note: Lowella Dell, MD, FACEP  CSN: [id number] MRN: [id number] ARRIVAL: 04/18/18 at 0626 ROOM: CH07C/[facility]07C   CHIEF COMPLAINT  Leg Pain   HISTORY OF PRESENT ILLNESS  04/18/18 6:43 AM Matthew Moses is a 67 y.o. male who had a STEMI on May 12 and underwent cardiac catheterization and stenting.  He had significant postprocedural ecchymosis of the right thigh which has been resolving.  Over the past 3 days he has developed edema and pain of the right lower extremity.  He rates his pain is a 20 out of 10, worse with palpation or movement.  He denies chest pain or shortness of breath.  He continues to have resolving ecchymosis of the right thigh with dependent ecchymosis of the right foot but no erythema or warmth.   Past Medical History:  Diagnosis Date  . CAD, multiple vessel    Severe native CAD involving left main, LAD, RCA, ramus intermedius and PDA -> status post CABG x2 (LIMA-LAD, SVG-L PDA)  . Coronary artery disease involving coronary bypass graft of native heart with angina pectoris (HCC) - thrombotic 95% oclusion of SVG-LPDA 04/03/2018   occluded LAD, subtotally occluded nondominant to codominant RCA, LCx & RI.  LIMA-LAD was intact. CULPRIT LESION was the SVG-dLPLA that had a high-grade proximal thrombotic stenosis.  S/P successful PCI of SVG-LPDA stented his vein graft with a 3.5 mm drug-eluting stent using distal protection.  [name] Kitchen Hx of CABG   . Hyperlipidemia associated with type 2 diabetes mellitus (HCC)   . Hypertension   . Tobacco abuse   . Type II diabetes mellitus with complication (HCC)    History of stroke and CAD-CABG    Past Surgical History:  Procedure Laterality Date  . CORONARY ARTERY BYPASS GRAFT    . CORONARY/GRAFT ACUTE MI REVASCULARIZATION N/A 04/02/2018   Procedure: Coronary/Graft Acute MI Revascularization;  Surgeon: [name], MD;  Location: [facility] INVASIVE CV LAB;  Service: Cardiovascular: Thrombotic 95% SVG-L LDJ:TTSV -  thrombectomy -> DES PCI Synergy DES 3.5 x 20 (3.9 mm)  . KNEE SURGERY    . LEFT HEART CATH AND CORS/GRAFTS ANGIOGRAPHY N/A 04/02/2018   Procedure: LEFT HEART CATH AND CORS/GRAFTS ANGIOGRAPHY;  Surgeon: [name], MD;  Location: [facility] INVASIVE CV LAB;  Service: Cardiovascular: Ostial Left Main 50%; distal Left Main-pLAD 100% - d(apical)LAD 95 (after patent LIMA-LAD).OstRI - 99%; ost-mLCx 95%, dCx 95% & OM2 95%; ost-pRCA (co-dominant) 99%mRCA 80%; SVG-LPDA 99% ostial thrombosis; EF 30% with a EDP 31 mmHg  . MUSCLE REPAIR    . TONSILLECTOMY      No family history on file.  Social History   Tobacco Use  . Smoking status: Current Every Day Smoker  . Smokeless tobacco: Never Used  Substance Use Topics  . Alcohol use: No  . Drug use: Yes    Types: Marijuana    Prior to Admission medications   Medication Sig Start Date End Date Taking? Authorizing Provider  amiodarone (PACERONE) 200 MG tablet Take 400mg  (2 tablets) twice daily for 5 days, then 200mg  (1 tablet) twice daily for 2 weeks, then 200mg  (1 tablet) daily. 04/05/18   , NP  aspirin 81 MG chewable tablet Chew 1 tablet (81 mg total) by mouth daily. 04/06/18   04/07/18, NP  atorvastatin (LIPITOR) 80 MG tablet Take 1 tablet (80 mg total) by mouth daily at 6 PM. 04/05/18   04/08/18, NP  empagliflozin (JARDIANCE) 10 MG TABS tablet Take 10 mg  by mouth daily. 04/05/18   Arty Baumgartner, NP  furosemide (LASIX) 20 MG tablet Take 1 tablet (20 mg total) by mouth daily for 7 days. 04/18/18 04/25/18  Aviva Kluver B, PA-C  Lancets [facility] ULTRASOFT) lancets Use as instructed 07/05/16   Layne Benton, NP  metFORMIN (GLUCOPHAGE) 500 MG tablet Take 1 tablet (500 mg total) by mouth 2 (two) times daily with a meal. 04/11/18   Kilroy, Eda Paschal, PA-C  nitroGLYCERIN (NITROSTAT) 0.4 MG SL tablet Place 1 tablet (0.4 mg total) under the tongue every 5 (five) minutes as needed. 04/05/18   Arty Baumgartner, NP    oxyCODONE-acetaminophen (PERCOCET/ROXICET) 5-325 MG tablet Take 1 tablet by mouth every 6 (six) hours as needed for up to 2 days for severe pain. 04/18/18 04/20/18  Aviva Kluver B, PA-C  ticagrelor (BRILINTA) 90 MG TABS tablet Take by mouth 2 (two) times daily.    [provider]  valsartan (DIOVAN) 40 MG tablet Take 1 tablet (40 mg total) by mouth daily. 04/05/18 07/04/18  Arty Baumgartner, NP    Allergies Codeine and Penicillins   REVIEW OF SYSTEMS  Negative except as noted here or in the History of Present Illness.   PHYSICAL EXAMINATION  Initial Vital Signs Blood pressure 134/68, pulse 67, temperature 98 F (36.7 C), temperature source Oral, resp. rate (!) 24, height 6\' 1"  (1.854 m), weight 108.4 kg (239 lb), SpO2 100 %.  Examination General: Well-developed, well-nourished male in no acute distress; appearance consistent with age of record HENT: normocephalic; atraumatic Eyes: pupils equal, round and reactive to light; extraocular muscles intact Neck: supple Heart: regular rate and rhythm Lungs: clear to auscultation bilaterally Abdomen: soft; nondistended; nontender; bowel sounds present Extremities: No deformity; pulses normal; resolving ecchymosis, tenderness and edema of the right lower extremity without erythema or warmth, right mid calf diameter 44 cm, left mid calf diameter 39 cm:      Neurologic: Awake, alert and oriented; motor function intact in all extremities and symmetric; no facial droop Skin: Warm and dry Psychiatric: Normal mood and affect   RESULTS  Summary of this visit's results, reviewed by myself:   EKG Interpretation  Date/Time:    Ventricular Rate:    PR Interval:    QRS Duration:   QT Interval:    QTC Calculation:   R Axis:     Text Interpretation:        Laboratory Studies: Results for orders placed or performed during the hospital encounter of 04/18/18 (from the past 24 hour(s))  CBC with Differential/Platelet     Status:  None   Collection Time: 04/18/18  6:44 AM  Result Value Ref Range   WBC 10.5 4.0 - 10.5 K/uL   RBC 4.41 4.22 - 5.81 MIL/uL   Hemoglobin 13.9 13.0 - 17.0 g/dL   HCT 38.2 50.5 - 39.7 %   MCV 89.3 78.0 - 100.0 fL   MCH 31.5 26.0 - 34.0 pg   MCHC 35.3 30.0 - 36.0 g/dL   RDW 67.3 41.9 - 37.9 %   Platelets 281 150 - 400 K/uL   Neutrophils Relative % 65 %   Neutro Abs 6.8 1.7 - 7.7 K/uL   Lymphocytes Relative 22 %   Lymphs Abs 2.3 0.7 - 4.0 K/uL   Monocytes Relative 9 %   Monocytes Absolute 1.0 0.1 - 1.0 K/uL   Eosinophils Relative 3 %   Eosinophils Absolute 0.3 0.0 - 0.7 K/uL   Basophils Relative 1 %   Basophils  Absolute 0.1 0.0 - 0.1 K/uL  Basic metabolic panel     Status: Abnormal   Collection Time: 04/18/18  6:44 AM  Result Value Ref Range   Sodium 134 (L) 135 - 145 mmol/L   Potassium 4.1 3.5 - 5.1 mmol/L   Chloride 101 101 - 111 mmol/L   CO2 24 22 - 32 mmol/L   Glucose, Bld 192 (H) 65 - 99 mg/dL   BUN 15 6 - 20 mg/dL   Creatinine, Ser 3.23 0.61 - 1.24 mg/dL   Calcium 8.8 (L) 8.9 - 10.3 mg/dL   GFR calc non Af Amer >60 >60 mL/min   GFR calc Af Amer >60 >60 mL/min   Anion gap 9 5 - 15  POC CBG, ED     Status: Abnormal   Collection Time: 04/18/18 10:07 AM  Result Value Ref Range   Glucose-Capillary 200 (H) 65 - 99 mg/dL  CBG monitoring, ED     Status: Abnormal   Collection Time: 04/18/18  1:26 PM  Result Value Ref Range   Glucose-Capillary 172 (H) 65 - 99 mg/dL   Comment 1 Notify RN    Imaging Studies: US Venous Img Lower Unilateral Right  Result Date: 04/18/2018 CLINICAL DATA:  67 year old male with right lower extremity pain and swelling following heart catheterization. EXAM: RIGHT LOWER EXTREMITY VENOUS DOPPLER ULTRASOUND TECHNIQUE: Gray-scale sonography with graded compression, as well as color Doppler and duplex ultrasound were performed to evaluate the lower extremity deep venous systems from the level of the common femoral vein and including the common femoral,  femoral, profunda femoral, popliteal and calf veins including the posterior tibial, peroneal and gastrocnemius veins when visible. The superficial great saphenous vein was also interrogated. Spectral Doppler was utilized to evaluate flow at rest and with distal augmentation maneuvers in the common femoral, femoral and popliteal veins. COMPARISON:  None. FINDINGS: Contralateral Common Femoral Vein: Respiratory phasicity is normal and symmetric with the symptomatic side. No evidence of thrombus. Normal compressibility. Common Femoral Vein: No evidence of thrombus. Normal compressibility, respiratory phasicity and response to augmentation. Saphenofemoral Junction: No evidence of thrombus. Normal compressibility and flow on color Doppler imaging. Profunda Femoral Vein: No evidence of thrombus. Normal compressibility and flow on color Doppler imaging. Femoral Vein: No evidence of thrombus. Normal compressibility, respiratory phasicity and response to augmentation. Popliteal Vein: No evidence of thrombus. Normal compressibility, respiratory phasicity and response to augmentation. Calf Veins: No evidence of thrombus. Normal compressibility and flow on color Doppler imaging. Superficial Great Saphenous Vein: No evidence of thrombus. Normal compressibility. Venous Reflux:  None. Other Findings: Mildly complex fluid collection in the superficial soft tissues of the proximal thigh measures approximately 10.6 x 1.5 x 5.4 cm. No evidence of internal vascularity. There is a second complex fluid collection in the medial aspect of the proximal thigh which measures 6.6 x 1.2 x 8.6 cm. Again, no internal vascularity identified on color Doppler. IMPRESSION: 1. No evidence of deep venous thrombosis. 2. There are 2 complex fluid collections in the superficial soft tissues of the anterior and anteromedial proximal thigh most likely representing hematomas given the history of recent heart catheterization. 3. No evidence of internal  vascularity in the imaged portions of the hematomas to suggest the presence of a pseudoaneurysm. Consider further evaluation with dedicated arterial ultrasound for confirmation. Electronically Signed   By: Malachy Moan M.D.   On: 04/18/2018 09:12    ED COURSE and MDM  Nursing notes and initial vitals signs, including pulse oximetry, reviewed.  Vitals:   04/18/18 0636 04/18/18 1005 04/18/18 1300 04/18/18 1533  BP:  (!) 99/50 119/85 122/80  Pulse:  64 (!) 55 (!) 59  Resp:  18 16 17   Temp:   97.7 F (36.5 C)   TempSrc:   Oral   SpO2:  100% 100% 100%  Weight: 108.4 kg (239 lb)     Height: 6\' 1"  (1.854 m)      6:47 AM I have a high index of suspicion for a right lower extremity DVT.  His dorsalis pedis pulse is +2 and I have a low index of suspicion of an arterial occlusion.  We will obtained Doppler ultrasound studies of both arterial and venous systems. Dr. to follow up on results and make disposition.  PROCEDURES    ED DIAGNOSES     ICD-10-CM   1. Hematoma of right lower extremity, initial encounter S80.11XA   2. Right leg pain M79.604        Deshonna Trnka, , MD 04/18/18 2231

## 2018-04-18 NOTE — Consult Note (Signed)
Cardiology Consultation:   Patient ID: Matthew Moses; [id number]; Nov 05, 1951   Admit date: 04/18/2018 Date of Consult: 04/18/2018  Primary Care Provider: System, Provider Not In Primary Cardiologist: Nanetta Batty, MD     History of Present Illness:   Matthew Moses presents to the emergency department with right foot/leg pain transient intermittent following cardiac catheterization approximately 2 weeks ago in the setting of cardiac arrest.  Post catheterization, trouble maintaining hemostasis.  Large hematoma developed.  Has been seen in the office on 04/11/2018.  Ecchymosis noted at that time.  Recently had more discomfort in his foot.  Feels more edematous.  There was concern for DVT.  Thankfully, venous Dopplers were normal.  Arterial Dopplers were also performed that showed no evidence of any significant pseudoaneurysm preliminarily and no distal flow limitation with triphasic flow in the tibial region.  Findings were reviewed with Dr. Eldridge Dace of interventional cardiology as well.  Currently he is feeling better.  He was able to ambulate to the bathroom without much difficulty.  No fevers.  No nausea vomiting chest pain shortness of breath.  Past Medical History:  Diagnosis Date  . CAD, multiple vessel    Severe native CAD involving left main, LAD, RCA, ramus intermedius and PDA -> status post CABG x2 (LIMA-LAD, SVG-L PDA)  . Coronary artery disease involving coronary bypass graft of native heart with angina pectoris (HCC) - thrombotic 95% oclusion of SVG-LPDA 04/03/2018   occluded LAD, subtotally occluded nondominant to codominant RCA, LCx & RI.  LIMA-LAD was intact. CULPRIT LESION was the SVG-dLPLA that had a high-grade proximal thrombotic stenosis.  S/P successful PCI of SVG-LPDA stented his vein graft with a 3.5 mm drug-eluting stent using distal protection.  [name] Kitchen Hx of CABG   . Hyperlipidemia associated with type 2 diabetes mellitus (HCC)   . Hypertension   . Tobacco abuse   . Type II  diabetes mellitus with complication (HCC)    History of stroke and CAD-CABG    Past Surgical History:  Procedure Laterality Date  . CORONARY ARTERY BYPASS GRAFT    . CORONARY/GRAFT ACUTE MI REVASCULARIZATION N/A 04/02/2018   Procedure: Coronary/Graft Acute MI Revascularization;  Surgeon: [name], MD;  Location: [facility] INVASIVE CV LAB;  Service: Cardiovascular: Thrombotic 95% SVG-L DUK:GURK - thrombectomy -> DES PCI Synergy DES 3.5 x 20 (3.9 mm)  . KNEE SURGERY    . LEFT HEART CATH AND CORS/GRAFTS ANGIOGRAPHY N/A 04/02/2018   Procedure: LEFT HEART CATH AND CORS/GRAFTS ANGIOGRAPHY;  Surgeon: [name], MD;  Location: [facility] INVASIVE CV LAB;  Service: Cardiovascular: Ostial Left Main 50%; distal Left Main-pLAD 100% - d(apical)LAD 95 (after patent LIMA-LAD).OstRI - 99%; ost-mLCx 95%, dCx 95% & OM2 95%; ost-pRCA (co-dominant) 99%mRCA 80%; SVG-LPDA 99% ostial thrombosis; EF 30% with a EDP 31 mmHg  . MUSCLE REPAIR    . TONSILLECTOMY       Home Medications:  Prior to Admission medications   Medication Sig Start Date End Date Taking? Authorizing Provider  amiodarone (PACERONE) 200 MG tablet Take 400mg  (2 tablets) twice daily for 5 days, then 200mg  (1 tablet) twice daily for 2 weeks, then 200mg  (1 tablet) daily. 04/05/18   Arty Baumgartner, NP  aspirin 81 MG chewable tablet Chew 1 tablet (81 mg total) by mouth daily. 04/06/18   Arty Baumgartner, NP  atorvastatin (LIPITOR) 80 MG tablet Take 1 tablet (80 mg total) by mouth daily at 6 PM. 04/05/18   Arty Baumgartner, NP  empagliflozin (JARDIANCE) 10 MG TABS  tablet Take 10 mg by mouth daily. 04/05/18   Arty Baumgartner, NP  Lancets [facility] ULTRASOFT) lancets Use as instructed 07/05/16   Layne Benton, NP  metFORMIN (GLUCOPHAGE) 500 MG tablet Take 1 tablet (500 mg total) by mouth 2 (two) times daily with a meal. 04/11/18   Kilroy, Eda Paschal, PA-C  nitroGLYCERIN (NITROSTAT) 0.4 MG SL tablet Place 1 tablet (0.4 mg total) under the tongue every 5  (five) minutes as needed. 04/05/18   Arty Baumgartner, NP  ticagrelor (BRILINTA) 90 MG TABS tablet Take by mouth 2 (two) times daily.    [provider]  valsartan (DIOVAN) 40 MG tablet Take 1 tablet (40 mg total) by mouth daily. 04/05/18 07/04/18  Arty Baumgartner, NP    Inpatient Medications: Scheduled Meds:  Continuous Infusions:  PRN Meds:   Allergies:    Allergies  Allergen Reactions  . Codeine Hives  . Penicillins Itching and Rash    Has patient had a PCN reaction causing immediate rash, facial/tongue/throat swelling, SOB or lightheadedness with hypotension: Yes Has patient had a PCN reaction causing severe rash involving mucus membranes or skin necrosis: No Has patient had a PCN reaction that required hospitalization: No Has patient had a PCN reaction occurring within the last 10 years: No If all of the above answers are "NO", then may proceed with Cephalosporin use.     Social History:   Social History   Socioeconomic History  . Marital status: Single    Spouse name: Not on file  . Number of children: Not on file  . Years of education: Not on file  . Highest education level: Not on file  Occupational History  . Not on file  Social Needs  . Financial resource strain: Not on file  . Food insecurity:    Worry: Not on file    Inability: Not on file  . Transportation needs:    Medical: Not on file    Non-medical: Not on file  Tobacco Use  . Smoking status: Current Every Day Smoker  . Smokeless tobacco: Never Used  Substance and Sexual Activity  . Alcohol use: No  . Drug use: Yes    Types: Marijuana  . Sexual activity: Not on file  Lifestyle  . Physical activity:    Days per week: Not on file    Minutes per session: Not on file  . Stress: Not on file  Relationships  . Social connections:    Talks on phone: Not on file    Gets together: Not on file    Attends religious service: Not on file    Active member of club or organization: Not on file      Attends meetings of clubs or organizations: Not on file    Relationship status: Not on file  . Intimate partner violence:    Fear of current or ex partner: Not on file    Emotionally abused: Not on file    Physically abused: Not on file    Forced sexual activity: Not on file  Other Topics Concern  . Not on file  Social History Narrative  . Not on file    Family History:   No early family history of CAD  ROS:  Please see the history of present illness.  Denies any fevers chills nausea vomiting syncope bleeding orthopnea. All other ROS reviewed and negative.     Physical Exam/Data:   Vitals:   04/18/18 1610 04/18/18 0636 04/18/18 1005 04/18/18 1300  BP: 134/68  (!) 99/50 119/85  Pulse: 67  64 (!) 55  Resp: (!) 24  18 16   Temp: 98 F (36.7 C)   97.7 F (36.5 C)  TempSrc: Oral   Oral  SpO2: 100%  100% 100%  Weight:  239 lb (108.4 kg)    Height:  6\' 1"  (1.854 m)     No intake or output data in the 24 hours ending 04/18/18 1509 Filed Weights   04/18/18 0636  Weight: 239 lb (108.4 kg)   Body mass index is 31.53 kg/m.  General:  Well nourished, well developed, in no acute distress HEENT: normal Lymph: no adenopathy Neck: no JVD Endocrine:  No thryomegaly Vascular: Able to palpate dorsalis pedis.  2+ capillary refill. Cardiac:  normal S1, S2; RRR; no murmur Lungs:  clear to auscultation bilaterally, no wheezing, rhonchi or rales  Abd: soft, nontender, no hepatomegaly  Ext: 2+ right lower extremity edema, diffuse.  Ecchymosis noted diffuse.  This was noted post catheterization. Musculoskeletal:  No deformities, BUE and BLE strength normal and equal Skin: warm and dry, foot is warm. Neuro:  CNs 2-12 intact, no focal abnormalities noted Psych:  Normal affect    Relevant CV Studies: Cardiac catheterization reviewed.  Laboratory Data:  Chemistry Recent Labs  Lab 04/18/18 0644  NA 134*  K 4.1  CL 101  CO2 24  GLUCOSE 192*  BUN 15  CREATININE 0.84   CALCIUM 8.8*  GFRNONAA >60  GFRAA >60  ANIONGAP 9    No results for input(s): PROT, ALBUMIN, AST, ALT, ALKPHOS, BILITOT in the last 168 hours. Hematology Recent Labs  Lab 04/18/18 0644  WBC 10.5  RBC 4.41  HGB 13.9  HCT 39.4  MCV 89.3  MCH 31.5  MCHC 35.3  RDW 14.4  PLT 281   Cardiac EnzymesNo results for input(s): TROPONINI in the last 168 hours. No results for input(s): TROPIPOC in the last 168 hours.  BNPNo results for input(s): BNP, PROBNP in the last 168 hours.  DDimer No results for input(s): DDIMER in the last 168 hours.  Radiology/Studies:  US Venous Img Lower Unilateral Right  Result Date: 04/18/2018 CLINICAL DATA:  67 year old male with right lower extremity pain and swelling following heart catheterization. EXAM: RIGHT LOWER EXTREMITY VENOUS DOPPLER ULTRASOUND TECHNIQUE: Gray-scale sonography with graded compression, as well as color Doppler and duplex ultrasound were performed to evaluate the lower extremity deep venous systems from the level of the common femoral vein and including the common femoral, femoral, profunda femoral, popliteal and calf veins including the posterior tibial, peroneal and gastrocnemius veins when visible. The superficial great saphenous vein was also interrogated. Spectral Doppler was utilized to evaluate flow at rest and with distal augmentation maneuvers in the common femoral, femoral and popliteal veins. COMPARISON:  None. FINDINGS: Contralateral Common Femoral Vein: Respiratory phasicity is normal and symmetric with the symptomatic side. No evidence of thrombus. Normal compressibility. Common Femoral Vein: No evidence of thrombus. Normal compressibility, respiratory phasicity and response to augmentation. Saphenofemoral Junction: No evidence of thrombus. Normal compressibility and flow on color Doppler imaging. Profunda Femoral Vein: No evidence of thrombus. Normal compressibility and flow on color Doppler imaging. Femoral Vein: No evidence of  thrombus. Normal compressibility, respiratory phasicity and response to augmentation. Popliteal Vein: No evidence of thrombus. Normal compressibility, respiratory phasicity and response to augmentation. Calf Veins: No evidence of thrombus. Normal compressibility and flow on color Doppler imaging. Superficial Great Saphenous Vein: No evidence of thrombus. Normal compressibility. Venous Reflux:  None.  Other Findings: Mildly complex fluid collection in the superficial soft tissues of the proximal thigh measures approximately 10.6 x 1.5 x 5.4 cm. No evidence of internal vascularity. There is a second complex fluid collection in the medial aspect of the proximal thigh which measures 6.6 x 1.2 x 8.6 cm. Again, no internal vascularity identified on color Doppler. IMPRESSION: 1. No evidence of deep venous thrombosis. 2. There are 2 complex fluid collections in the superficial soft tissues of the anterior and anteromedial proximal thigh most likely representing hematomas given the history of recent heart catheterization. 3. No evidence of internal vascularity in the imaged portions of the hematomas to suggest the presence of a pseudoaneurysm. Consider further evaluation with dedicated arterial ultrasound for confirmation. Electronically Signed   By: Malachy Moan M.D.   On: 04/18/2018 09:12    Assessment and Plan:   Right leg pain/edema - Thankfully, venous ultrasound does not show any evidence of DVT.  Arterial ultrasound shows preliminarily triphasic distal flow which is reassuring in the tibial region.  On exam, foot is warm, good capillary refill.  Edema 2+ right extremity.  Ecchymosis noted. - The hematoma noted on the ultrasound is residual hematoma from his prior cardiac catheterization 2 weeks ago.  They had trouble maintaining hemostasis.  His edema is likely result of hematoma/inflammatory state as well as likely some decreased venous return due to possible compression however there is no evidence of DVT  thankfully. - No need to discontinue Brilinta.  Continue with dual antiplatelet therapy.  He is not having any active bleeding.  His foot is feeling better.  Earlier this morning felt tight with some neuropathic discomfort. - I recommend Lasix 20 mg once a day for the next 7 days.  Compression hose.  Elevation.  Hopefully this will continue to resolve.  Thankfully, studies as above do not show any limb threatening findings.  CAD - Post cardiac catheterization in the setting of cardiac arrest reviewed.  This occurred approximately 2 weeks ago.  Dr. Nanetta Batty.  I am comfortable with discharge from the emergency department with close follow-up.  Patient is also comfortable with this as well.  For questions or updates, please contact [facility] Please consult [email or web address] for contact info under Cardiology/STEMI.   Signed, Donato Schultz, MD  04/18/2018 3:09 PM

## 2018-04-18 NOTE — ED Triage Notes (Signed)
Patient reports right lower extremity swelling and pain onset 3-4 days ago. Patient recently had cardiac stent placed.

## 2018-04-18 NOTE — ED Notes (Signed)
Pt ambulated to the restroom with cane independently.

## 2018-04-18 NOTE — ED Provider Notes (Signed)
Physical Exam  BP 119/85 (BP Location: Right Arm)   Pulse (!) 55   Temp 97.7 F (36.5 C) (Oral)   Resp 16   Ht 6\' 1"  (1.854 m)   Wt 108.4 kg (239 lb)   SpO2 100%   BMI 31.53 kg/m    Briefly, the patient is a 67 y.o. male with PMHx of  has a past medical history of CAD, multiple vessel, Coronary artery disease involving coronary bypass graft of native heart with angina pectoris (HCC) - thrombotic 95% oclusion of SVG-LPDA (04/03/2018), CABG, Hyperlipidemia associated with type 2 diabetes mellitus (HCC), Hypertension, Tobacco abuse, and Type II diabetes mellitus with complication (HCC). here with right leg pain. Assumed care after patient arrived from [facility] for evaluation of arterial access site in RLE after coronary catheterization on 5/12 with arterial ultrasound to rule out pseudoaneurysm.  Patient reports that he began to have an expanding hematoma of the right medial thigh 3 days ago, that was compressed by his home health nurse.  Patient continued to develop edema and ecchymosis spreading down the leg since this time.  Patient continues to have pain in the right lower extremity despite analgesia.  Labs Reviewed  BASIC METABOLIC PANEL - Abnormal; Notable for the following components:      Result Value   Sodium 134 (*)    Glucose, Bld 192 (*)    Calcium 8.8 (*)    All other components within normal limits  CBG MONITORING, ED - Abnormal; Notable for the following components:   Glucose-Capillary 200 (*)    All other components within normal limits  CBC WITH DIFFERENTIAL/PLATELET    Course of Care:  Physical Exam  Constitutional: He appears well-developed and well-nourished. No distress.  Sitting comfortably in bed.  HENT:  Head: Normocephalic and atraumatic.  Eyes: Conjunctivae are normal. Right eye exhibits no discharge. Left eye exhibits no discharge.  EOMs normal to gross examination.  Neck: Normal range of motion.  Cardiovascular: Normal rate and regular rhythm.   Intact, 2+ DP and PT pulses of RLE.  Pulmonary/Chest:  Normal respiratory effort. Patient converses comfortably. No audible wheeze or stridor.  Abdominal: He exhibits no distension.  Musculoskeletal:  See clinical photos from previous provider for exam.  2+ nonpitting LE edema of the RLE. Patient has soft and well perfused compartments of the right lower extremity.  Sensation is intact in the distal right lower extremity.  Neurological: He is alert.  Cranial nerves intact to gross observation. Patient moves extremities without difficulty.  Skin: Skin is warm and dry. He is not diaphoretic.  Patient has multiple hematomas present particularly in the right medial thigh, popliteal region, and presumed dependent ecchymosis of the right foot.  Psychiatric: He has a normal mood and affect. His behavior is normal. Judgment and thought content normal.  Nursing note and vitals reviewed.   ED Course/Procedures   Clinical Course as of Apr 18 1753  Tue Apr 18, 2018  1336 Case discussed with cardiology per Dr. Apr 20, 2018.   [AM]  1349 Awaiting recommendations of interventional cardiology. Possibly call vascular.   [AM]  1530 I have reviewed the patient's information in the [facility] Controlled Substance Database for the past 12 months and found them to have no recent/overlapping Rx.  Opiates were prescribed for an acute, painful condition. The patient was given information on side effects and encouraged to use other, non-opiate pain medication primary, only using opiate medicine sparingly for severe pain.   [AM]    Clinical  Course User Index [AM] Elisha Ponder, PA-C    Procedures   MDM   Patient is nontoxic-appearing, but significantly uncomfortable due to the pain in the right lower extremity.  Venous duplex of the right lower extremity demonstrates no DVT, performed at bedside [place].  Arterial Doppler demonstrates no evidence of pseudoaneurysm, but does show multiple heterogeneous  areas, suggestive of possible hematoma versus unknown etiology.  Patient was independently evaluated by Dr. Donato Schultz of cardiology who recommended Lasix 20 mg daily for 1 week to remove fluid extravasated from the right lower extremity.  Patient also supplied with Ace wrap in the emergency department for compression.  Patient to continue DAPT per cardiology recommendation.  Appreciate cardiology involvement in this patient case.  I have reviewed the patient's information in the [place] Controlled Substance Database for the past 12 months and found them to have no overlapping Rx.  Opiates were prescribed for an acute, painful condition. The patient was given information on side effects and encouraged to use other, non-opiate pain medication primary, only using opiate medicine sparingly for severe pain.     Delia Chimes 04/18/18 1917    Tilden Fossa, MD 04/20/18 1051

## 2018-04-18 NOTE — ED Notes (Signed)
R foot pale with ecchymosis to all digits. Foot cooler than rest of extremity.

## 2018-04-18 NOTE — ED Notes (Signed)
Pt transported to vascular.  °

## 2018-04-18 NOTE — ED Notes (Signed)
Ultrasound being performed at bedside.

## 2018-04-18 NOTE — Discharge Instructions (Addendum)
Please see the information and instructions below regarding your visit.  Your diagnoses today include:  1. Hematoma of right lower extremity, initial encounter   2. Right leg pain     Tests performed today include: See side panel of your discharge paperwork for testing performed today. Vital signs are listed at the bottom of these instructions.   The ultrasound of your leg showed no evidence of a DVT and no evidence of an aneurysm.  You do have significant blood collection due to hematoma.   Medications prescribed:    Take any prescribed medications only as prescribed, and any over the counter medications only as directed on the packaging.  Please take Lasix 20 mg daily for 7 days.  This is a medication that will help remove fluid.  It will make you urinate more.  You have been prescribed Percocet for pain. This is an opioid pain medication. You may take this medication every 4-6 hours as needed for pain. Only take this medication if you need it for breakthrough pain.   Do not combine this medication with Tylenol, as it may increase the risk of liver problems.  Do not combine this medication with alcohol.  Please be advised to avoid driving or operating heavy machinery while taking this medication, as it may make you drowsy or impair judgment.    Home care instructions:  Please follow any educational materials contained in this packet.   Follow-up instructions: Please follow-up with your primary care provider for further evaluation of your symptoms if they are not completely improved.   Please follow up with Dr. Diona Fanti of cardiology.  Return instructions:  Please return to the Emergency Department if you experience worsening symptoms.  Please return to the emergency department if you develop  significant pain significant increase in pain or swelling of the right lower extremity, change in color where it is turning white, blue, or cold. Please return if you have any other  emergent concerns.  Additional Information:   Your vital signs today were: BP 119/85 (BP Location: Right Arm)    Pulse (!) 55    Temp 97.7 F (36.5 C) (Oral)    Resp 16    Ht 6\' 1"  (1.854 m)    Wt 108.4 kg (239 lb)    SpO2 100%    BMI 31.53 kg/m  If your blood pressure (BP) was elevated on multiple readings during this visit above 130 for the top number or above 80 for the bottom number, please have this repeated by your primary care provider within one month. --------------  Thank you for allowing to participate in your care today.

## 2018-04-18 NOTE — ED Notes (Signed)
Cardiology at bedside.

## 2018-04-18 NOTE — ED Provider Notes (Signed)
Patient is a 67 year old male who had a recent cardiac catheterization on May 12 after a V. tach arrest.  He states that he had a large amount of ecchymosis to his thigh right after the cardiac catheterization but since that time the ecchymosis and swelling has spread down his leg.  He states over the last 4 days the swelling has gotten worse and the pain in his leg has worsened.  No fevers.  No shortness of breath.  Dorsalis pedis pulse is palpable.  He was seen by Dr. Read Drivers who had ordered venous and arterial Dopplers.  The venous Doppler has been done and shows no evidence of DVT.  They are unable to do arterial Dopplers at this facility.  I have talked to Dr. Clarene Duke in the [facility] emergency department who has accepted the patient for transfer for an arterial Doppler.   Rolan Bucco, MD 04/18/18 [phone]

## 2018-04-19 ENCOUNTER — Encounter: Payer: Self-pay | Admitting: Cardiology

## 2018-04-19 ENCOUNTER — Ambulatory Visit (INDEPENDENT_AMBULATORY_CARE_PROVIDER_SITE_OTHER): Payer: Medicaid Other | Admitting: Cardiology

## 2018-04-19 DIAGNOSIS — Z9861 Coronary angioplasty status: Secondary | ICD-10-CM

## 2018-04-19 DIAGNOSIS — Z951 Presence of aortocoronary bypass graft: Secondary | ICD-10-CM

## 2018-04-19 DIAGNOSIS — I472 Ventricular tachycardia, unspecified: Secondary | ICD-10-CM

## 2018-04-19 DIAGNOSIS — I255 Ischemic cardiomyopathy: Secondary | ICD-10-CM

## 2018-04-19 DIAGNOSIS — I2129 ST elevation (STEMI) myocardial infarction involving other sites: Secondary | ICD-10-CM | POA: Diagnosis not present

## 2018-04-19 DIAGNOSIS — I2510 Atherosclerotic heart disease of native coronary artery without angina pectoris: Secondary | ICD-10-CM | POA: Diagnosis not present

## 2018-04-19 DIAGNOSIS — R600 Localized edema: Secondary | ICD-10-CM | POA: Insufficient documentation

## 2018-04-19 MED ORDER — CEPHALEXIN 500 MG PO CAPS: 500.0000 mg | ORAL_CAPSULE | Freq: Two times a day (BID) | ORAL | 0 refills | Status: DC

## 2018-04-19 NOTE — Assessment & Plan Note (Signed)
S/P acute MI with cardiac arrest 04/02/18 and VT requiring cardioversion

## 2018-04-19 NOTE — Assessment & Plan Note (Signed)
S/P emergency PCI with DES to SVG-LPDA  04/02/18 S/P CABG in 2012 [place]- no records

## 2018-04-19 NOTE — Assessment & Plan Note (Signed)
Pt seen today with pain, swelling, and edema of his right leg. He was seen in the ED 04/18/18. Doppler negative for pseudoaneurysm, DVT, and arterial dopplers negative for distal restriction.

## 2018-04-19 NOTE — Progress Notes (Signed)
04/19/2018 Matthew Moses   1950-12-04  [id number]  Primary Physician System, Provider Not In Primary Cardiologist: Dr Allyson Sabal  HPI:   67 y/o male, (looks older), with a history of CABG x 2 in [place] in 2012- no details available. He admits he hasn't taken any medication "in years". In 2017 he had a Lt brain stroke treated with TPA. He has trouble writing but otherwise recovered. He developed chest pain and SOB 04/05/18. He actually had syncope at home and woke back up and called 911. When he arrived in the ED he was being coded. He was in wide complex rhythm and unresponsive. He was shocked to NSR and placed on Amiodarone. With resumption of NSR his EKG showed acute ST elevation in AVR and V1. He was taken urgently to the cath lab. He was found to have thrombus in the SVG-LPDA which was treated with a DES. His LIMA-LAD was patent with distal LAD disease and his unprotected native RCA was 99% occluded proximally, this was a small non dominant vessel. The pt's EF at cath was 30%. He did well post PCI. Medications were added including treatment for his DM. A f/u echo showed improvement in his LVF to 40-45%. I saw him in the office 04/11/18. He complained of pain and swelling of his Rt thigh. He had problems with hemostasis post PCI. He also had complained of pleuritic chest pain and I ordered a CTA- which was negative for PE. He then presented to the ED 04/18/18 with pain in his Rt lower leg and discoloration of toes. He was evaluated by Dr Anne Fu in the ED. Venous dopplers were negative for DVT, arterial dopplers negative for pseudoaneurysm or compromised arterial flow. He is in the office today for follow up. He continues to have pain and swelling.    Current Outpatient Medications  Medication Sig Dispense Refill  . amiodarone (PACERONE) 200 MG tablet Take 400mg  (2 tablets) twice daily for 5 days, then 200mg  (1 tablet) twice daily for 2 weeks, then 200mg  (1 tablet) daily. 60 tablet 0  . aspirin 81 MG  chewable tablet Chew 1 tablet (81 mg total) by mouth daily.    atorvastatin (LIPITOR) 80 MG tablet Take 1 tablet (80 mg total) by mouth daily at 6 PM. 90 tablet 1  . empagliflozin (JARDIANCE) 10 MG TABS tablet Take 10 mg by mouth daily. 30 tablet 1  . furosemide (LASIX) 20 MG tablet Take 1 tablet (20 mg total) by mouth daily for 7 days. 7 tablet 0  . Lancets (ONETOUCH ULTRASOFT) lancets Use as instructed 100 each 12  . metFORMIN (GLUCOPHAGE) 500 MG tablet Take 1 tablet (500 mg total) by mouth 2 (two) times daily with a meal. 180 tablet 4  . nitroGLYCERIN (NITROSTAT) 0.4 MG SL tablet Place 1 tablet (0.4 mg total) under the tongue every 5 (five) minutes as needed. 25 tablet 2  . oxyCODONE-acetaminophen (PERCOCET/ROXICET) 5-325 MG tablet Take 1 tablet by mouth every 6 (six) hours as needed for up to 2 days for severe pain. 8 tablet 0  . ticagrelor (BRILINTA) 90 MG TABS tablet Take by mouth 2 (two) times daily.    . valsartan (DIOVAN) 40 MG tablet Take 1 tablet (40 mg total) by mouth daily. 30 tablet 2  . cephALEXin (KEFLEX) 500 MG capsule Take 1 capsule (500 mg total) by mouth 2 (two) times daily. 14 capsule 0   No current facility-administered medications for this visit.     Allergies  Allergen  Reactions  . Codeine Hives  . Penicillins Itching and Rash    Has patient had a PCN reaction causing immediate rash, facial/tongue/throat swelling, SOB or lightheadedness with hypotension: Yes Has patient had a PCN reaction causing severe rash involving mucus membranes or skin necrosis: No Has patient had a PCN reaction that required hospitalization: No Has patient had a PCN reaction occurring within the last 10 years: No If all of the above answers are "NO", then may proceed with Cephalosporin use.     Past Medical History:  Diagnosis Date  . CAD, multiple vessel    Severe native CAD involving left main, LAD, RCA, ramus intermedius and PDA -> status post CABG x2 (LIMA-LAD, SVG-L PDA)  .  Coronary artery disease involving coronary bypass graft of native heart with angina pectoris (HCC) - thrombotic 95% oclusion of SVG-LPDA 04/03/2018   occluded LAD, subtotally occluded nondominant to codominant RCA, LCx & RI.  LIMA-LAD was intact. CULPRIT LESION was the SVG-dLPLA that had a high-grade proximal thrombotic stenosis.  S/P successful PCI of SVG-LPDA stented his vein graft with a 3.5 mm drug-eluting stent using distal protection.  [name] Kitchen Hx of CABG   . Hyperlipidemia associated with type 2 diabetes mellitus (HCC)   . Hypertension   . Tobacco abuse   . Type II diabetes mellitus with complication (HCC)    History of stroke and CAD-CABG    Social History   Socioeconomic History  . Marital status: Single    Spouse name: Not on file  . Number of children: Not on file  . Years of education: Not on file  . Highest education level: Not on file  Occupational History  . Not on file  Social Needs  . Financial resource strain: Not on file  . Food insecurity:    Worry: Not on file    Inability: Not on file  . Transportation needs:    Medical: Not on file    Non-medical: Not on file  Tobacco Use  . Smoking status: Current Every Day Smoker  . Smokeless tobacco: Never Used  Substance and Sexual Activity  . Alcohol use: No  . Drug use: Yes    Types: Marijuana  . Sexual activity: Not on file  Lifestyle  . Physical activity:    Days per week: Not on file    Minutes per session: Not on file  . Stress: Not on file  Relationships  . Social connections:    Talks on phone: Not on file    Gets together: Not on file    Attends religious service: Not on file    Active member of club or organization: Not on file    Attends meetings of clubs or organizations: Not on file    Relationship status: Not on file  . Intimate partner violence:    Fear of current or ex partner: Not on file    Emotionally abused: Not on file    Physically abused: Not on file    Forced sexual activity: Not on file    Other Topics Concern  . Not on file  Social History Narrative  . Not on file     No family history on file.   Review of Systems: General: negative for chills, fever, night sweats or weight changes.  Cardiovascular: negative for chest pain, dyspnea on exertion, edema, orthopnea, palpitations, paroxysmal nocturnal dyspnea or shortness of breath Dermatological: negative for rash Respiratory: negative for cough or wheezing Urologic: negative for hematuria Abdominal: negative for nausea, vomiting,  diarrhea, bright red blood per rectum, melena, or hematemesis Neurologic: negative for visual changes, syncope, or dizziness All other systems reviewed and are otherwise negative except as noted above.    Blood pressure 122/62, pulse 67, height 6\' 1"  (1.854 m), weight 239 lb (108.4 kg).  General appearance: alert, cooperative and no distress Neck: no JVD Extremities: RLE 2+ edema, ecchymostic toes, warm to touch Neurologic: Grossly normal   ASSESSMENT AND PLAN:   Leg edema, right Pt seen today with pain, swelling, and edema of his right leg. He was seen in the ED 04/18/18. Doppler negative for pseudoaneurysm, DVT, and arterial dopplers negative for distal restriction.   Ventricular tachycardia, sustained (HCC) -n setting of STEMI Pt discharged on Amiodarone  Ischemic cardiomyopathy - EF 30% by LV gram, improved to 40-45% by post MI echo  Hx of CABG S/P CABG in 2012 [place]- no records  CAD S/P percutaneous coronary angioplasty S/P emergency PCI with DES to SVG-LPDA  04/02/18 S/P CABG in 2012 [place]- no records  Acute ST elevation myocardial infarction (STEMI) of posterior wall (HCC) S/P acute MI with cardiac arrest 04/02/18 and VT requiring cardioversion   PLAN  Pt was seen by Dr Allyson Sabal and myself today in the office. Plan is for elevation, compression stockings, and Keflex 500 mg BID x 7 days. F/U 1 week   [name] PA-C 04/19/2018 11:35 AM

## 2018-04-19 NOTE — Assessment & Plan Note (Signed)
Pt discharged on Amiodarone

## 2018-04-19 NOTE — Assessment & Plan Note (Signed)
S/P CABG in 2012 [place]- no records 

## 2018-04-19 NOTE — Assessment & Plan Note (Signed)
EF 30% by LV gram, improved to 40-45% by post MI echo

## 2018-04-19 NOTE — Patient Instructions (Signed)
Medication Instructions: Start: Keflex 500 mg two times a day for 7 days  If you need a refill on your cardiac medications before your next appointment, please call your pharmacy.     Follow-Up: Your physician wants you to follow-up in 2 weeks with [name], PA   Special Instructions:    Thank you for choosing [facility] at [facility]!!

## 2018-04-26 ENCOUNTER — Other Ambulatory Visit: Payer: Self-pay

## 2018-04-26 ENCOUNTER — Encounter: Payer: Self-pay | Admitting: Cardiology

## 2018-04-26 ENCOUNTER — Ambulatory Visit (INDEPENDENT_AMBULATORY_CARE_PROVIDER_SITE_OTHER): Payer: Medicaid Other | Admitting: Cardiology

## 2018-04-26 VITALS — BP 120/62 | HR 59 | Ht 73.0 in | Wt 237.2 lb

## 2018-04-26 DIAGNOSIS — Z9861 Coronary angioplasty status: Secondary | ICD-10-CM

## 2018-04-26 DIAGNOSIS — M7989 Other specified soft tissue disorders: Secondary | ICD-10-CM

## 2018-04-26 DIAGNOSIS — I2510 Atherosclerotic heart disease of native coronary artery without angina pectoris: Secondary | ICD-10-CM | POA: Diagnosis not present

## 2018-04-26 DIAGNOSIS — M79605 Pain in left leg: Principal | ICD-10-CM

## 2018-04-26 DIAGNOSIS — M79604 Pain in right leg: Secondary | ICD-10-CM

## 2018-04-26 DIAGNOSIS — M79661 Pain in right lower leg: Secondary | ICD-10-CM

## 2018-04-26 MED ORDER — TRAMADOL HCL 50 MG PO TABS: 50.0000 mg | ORAL_TABLET | Freq: Four times a day (QID) | ORAL | 0 refills | Status: DC | PRN

## 2018-04-26 NOTE — Patient Instructions (Addendum)
Medication Instructions: Tramadol 50 mg every 6 hours as needed for pain for 7 days   If you need a refill on your cardiac medications before your next appointment, please call your pharmacy.       Follow-Up: Your physician wants you to follow-up in 4 to 6 weeks with Dr. Allyson Sabal    Special Instructions: Referral to Vascular Surgeon   Thank you for choosing [facility] at [facility]!!

## 2018-04-26 NOTE — Progress Notes (Signed)
04/26/2018 Matthew Moses   20-Jul-1951  [id number]  Primary Physician System, Provider Not In Primary Cardiologist: Dr Allyson Sabal  HPI:   67 y/o male with known CAD, s/p prior CABG presented with VT arrest 04/02/18. He had been off all his medications. He received a PCI to his SVG-PD. He did well post PCI.  Medications were added including treatment for his DM. A f/u echo showed improvement in his LVF to 40-45%. When seen in f/u  04/11/18 he complained of pain and swelling of his Rt thigh. He had problems with hemostasis post PCI. He then presented to the ED 04/18/18 with pain in his Rt lower leg and discoloration of toes. He was evaluated by Dr Anne Fu in the ED. Venous dopplers were negative for DVT, arterial dopplers negative for pseudoaneurysm or compromised arterial flow. He was in the office 04/19/18 and seen  By Dr Allyson Sabal. He was placed on antibiotics and compression stockings. He returns today for follow up. He continues to have calf pain and swelling.  He says the pain is keeping him awake at night. for follow up.     Current Outpatient Medications  Medication Sig Dispense Refill  . amiodarone (PACERONE) 200 MG tablet Take 400mg  (2 tablets) twice daily for 5 days, then 200mg  (1 tablet) twice daily for 2 weeks, then 200mg  (1 tablet) daily. 60 tablet 0  . aspirin 81 MG chewable tablet Chew 1 tablet (81 mg total) by mouth daily.    atorvastatin (LIPITOR) 80 MG tablet Take 1 tablet (80 mg total) by mouth daily at 6 PM. 90 tablet 1  . cephALEXin (KEFLEX) 500 MG capsule Take 1 capsule (500 mg total) by mouth 2 (two) times daily. 14 capsule 0  . empagliflozin (JARDIANCE) 10 MG TABS tablet Take 10 mg by mouth daily. 30 tablet 1  . Lancets (ONETOUCH ULTRASOFT) lancets Use as instructed 100 each 12  . metFORMIN (GLUCOPHAGE) 500 MG tablet Take 1 tablet (500 mg total) by mouth 2 (two) times daily with a meal. 180 tablet 4  . nitroGLYCERIN (NITROSTAT) 0.4 MG SL tablet Place 1 tablet (0.4 mg total) under the  tongue every 5 (five) minutes as needed. 25 tablet 2  . ticagrelor (BRILINTA) 90 MG TABS tablet Take by mouth 2 (two) times daily.    . valsartan (DIOVAN) 40 MG tablet Take 1 tablet (40 mg total) by mouth daily. 30 tablet 2  . furosemide (LASIX) 20 MG tablet Take 1 tablet (20 mg total) by mouth daily for 7 days. 7 tablet 0  . traMADol (ULTRAM) 50 MG tablet Take 1 tablet (50 mg total) by mouth every 6 (six) hours as needed. 28 tablet 0   No current facility-administered medications for this visit.     Allergies  Allergen Reactions  . Codeine Hives  . Penicillins Itching and Rash    Has patient had a PCN reaction causing immediate rash, facial/tongue/throat swelling, SOB or lightheadedness with hypotension: Yes Has patient had a PCN reaction causing severe rash involving mucus membranes or skin necrosis: No Has patient had a PCN reaction that required hospitalization: No Has patient had a PCN reaction occurring within the last 10 years: No If all of the above answers are "NO", then may proceed with Cephalosporin use.     Past Medical History:  Diagnosis Date  . CAD, multiple vessel    Severe native CAD involving left main, LAD, RCA, ramus intermedius and PDA -> status post CABG x2 (LIMA-LAD, SVG-L PDA)  .  Coronary artery disease involving coronary bypass graft of native heart with angina pectoris (HCC) - thrombotic 95% oclusion of SVG-LPDA 04/03/2018   occluded LAD, subtotally occluded nondominant to codominant RCA, LCx & RI.  LIMA-LAD was intact. CULPRIT LESION was the SVG-dLPLA that had a high-grade proximal thrombotic stenosis.  S/P successful PCI of SVG-LPDA stented his vein graft with a 3.5 mm drug-eluting stent using distal protection.  [name] Kitchen Hx of CABG   . Hyperlipidemia associated with type 2 diabetes mellitus (HCC)   . Hypertension   . Tobacco abuse   . Type II diabetes mellitus with complication (HCC)    History of stroke and CAD-CABG    Social History   Socioeconomic History    . Marital status: Single    Spouse name: Not on file  . Number of children: Not on file  . Years of education: Not on file  . Highest education level: Not on file  Occupational History  . Not on file  Social Needs  . Financial resource strain: Not on file  . Food insecurity:    Worry: Not on file    Inability: Not on file  . Transportation needs:    Medical: Not on file    Non-medical: Not on file  Tobacco Use  . Smoking status: Current Every Day Smoker  . Smokeless tobacco: Never Used  Substance and Sexual Activity  . Alcohol use: No  . Drug use: Yes    Types: Marijuana  . Sexual activity: Not on file  Lifestyle  . Physical activity:    Days per week: Not on file    Minutes per session: Not on file  . Stress: Not on file  Relationships  . Social connections:    Talks on phone: Not on file    Gets together: Not on file    Attends religious service: Not on file    Active member of club or organization: Not on file    Attends meetings of clubs or organizations: Not on file    Relationship status: Not on file  . Intimate partner violence:    Fear of current or ex partner: Not on file    Emotionally abused: Not on file    Physically abused: Not on file    Forced sexual activity: Not on file  Other Topics Concern  . Not on file  Social History Narrative  . Not on file     No family history on file.   Review of Systems: General: negative for chills, fever, night sweats or weight changes.  Cardiovascular: negative for chest pain, dyspnea on exertion, edema, orthopnea, palpitations, paroxysmal nocturnal dyspnea or shortness of breath Dermatological: negative for rash Respiratory: negative for cough or wheezing Urologic: negative for hematuria Abdominal: negative for nausea, vomiting, diarrhea, bright red blood per rectum, melena, or hematemesis Neurologic: negative for visual changes, syncope, or dizziness All other systems reviewed and are otherwise negative  except as noted above.    Blood pressure 120/62, pulse (!) 59, height 6\' 1"  (1.854 m), weight 237 lb 3.2 oz (107.6 kg).  General appearance: alert, cooperative and no distress Extremities: Rt leg edematous and firm c/w LLE. He has errythemia lateral aspect of his foot. The ecchymosis seen last week is improving.   EKG NSR, LAD  ASSESSMENT AND PLAN:   Leg edema, right Pt seen today with pain, swelling, and edema of his right leg. He was seen in the ED 04/18/18. Doppler negative for pseudoaneurysm, DVT, and arterial dopplers negative for  distal restriction. No real improvement in his symptoms with compression stockings and ABs.    PLAN  Discussed with Dr Allyson Sabal- he would like referral to a vascular specialist.   [name] PA-C 04/26/2018 11:28 AM

## 2018-04-26 NOTE — Assessment & Plan Note (Signed)
Pt seen today with pain, swelling, and edema of his right leg. He was seen in the ED 04/18/18. Doppler negative for pseudoaneurysm, DVT, and arterial dopplers negative for distal restriction. I spoke with Dr Allyson Sabal in the office today, concern is for compartment syndrome. Will get an MRI.

## 2018-04-27 ENCOUNTER — Telehealth: Payer: Self-pay | Admitting: Cardiology

## 2018-04-27 NOTE — Telephone Encounter (Signed)
Pt calling and returning call from [place]. Please call pt.

## 2018-04-27 NOTE — Telephone Encounter (Signed)
Returned call to patient he stated he is having severe pain in right leg.Stated he has appointment with Dr.Dickson 6/28.Stated he cannot wait that long.Spoke to [employer] PA he advised to go to [facility] ED.Advised he will need a CT of leg to rule out possible compartment syndrome.Spoke to triage RN Luke's recommendations given.

## 2018-04-28 ENCOUNTER — Encounter (HOSPITAL_COMMUNITY): Payer: Self-pay | Admitting: *Deleted

## 2018-04-28 ENCOUNTER — Other Ambulatory Visit: Payer: Self-pay

## 2018-04-28 ENCOUNTER — Emergency Department (HOSPITAL_COMMUNITY)
Admission: EM | Admit: 2018-04-28 | Discharge: 2018-04-28 | Discharge status: Against Medical Advice | Payer: Medicaid Other | Attending: Emergency Medicine | Admitting: Emergency Medicine

## 2018-04-28 DIAGNOSIS — F17210 Nicotine dependence, cigarettes, uncomplicated: Secondary | ICD-10-CM | POA: Diagnosis not present

## 2018-04-28 DIAGNOSIS — Z7984 Long term (current) use of oral hypoglycemic drugs: Secondary | ICD-10-CM | POA: Diagnosis not present

## 2018-04-28 DIAGNOSIS — M79604 Pain in right leg: Secondary | ICD-10-CM | POA: Diagnosis present

## 2018-04-28 DIAGNOSIS — I2510 Atherosclerotic heart disease of native coronary artery without angina pectoris: Secondary | ICD-10-CM | POA: Insufficient documentation

## 2018-04-28 DIAGNOSIS — E119 Type 2 diabetes mellitus without complications: Secondary | ICD-10-CM | POA: Diagnosis not present

## 2018-04-28 DIAGNOSIS — Z951 Presence of aortocoronary bypass graft: Secondary | ICD-10-CM | POA: Insufficient documentation

## 2018-04-28 DIAGNOSIS — Z79899 Other long term (current) drug therapy: Secondary | ICD-10-CM | POA: Diagnosis not present

## 2018-04-28 DIAGNOSIS — Z7982 Long term (current) use of aspirin: Secondary | ICD-10-CM | POA: Diagnosis not present

## 2018-04-28 NOTE — ED Provider Notes (Signed)
[facility] EMERGENCY DEPARTMENT Provider Note   CSN: [id number] Arrival date & time: 04/28/18  [id number]     History   Chief Complaint Chief Complaint  Patient presents with  . Leg Pain   Level 5 caveat: uncooperative HPI Matthew Moses is a 67 y.o. male.  HPI 67 yo male with compolaints of right leg pain. Recently seen in ER with negative venous duplex for DVT and no arterial flow issues and no evidence of psuedoaneurysm.  Presents today with complaints of right leg pain and swelling.   15 minutes after patient arrival and placement in to room 19,  I went to see the patient where he reports he no longer wants to be seen or evaluated. Reports he wants to leave AMA. Pt reports he was told by his doctor he may have "compartment syndrome".  At this point he began walking out of the ER.  I walked with him attempting to convince him to stay, allow me to examine his leg.  I tried to determine what had frustrated him during his 15 minutes in the ER but was unable to obtain this information.  He is alert and ambulatory  Past Medical History:  Diagnosis Date  . CAD, multiple vessel    Severe native CAD involving left main, LAD, RCA, ramus intermedius and PDA -> status post CABG x2 (LIMA-LAD, SVG-L PDA)  . Coronary artery disease involving coronary bypass graft of native heart with angina pectoris (HCC) - thrombotic 95% oclusion of SVG-LPDA 04/03/2018   occluded LAD, subtotally occluded nondominant to codominant RCA, LCx & RI.  LIMA-LAD was intact. CULPRIT LESION was the SVG-dLPLA that had a high-grade proximal thrombotic stenosis.  S/P successful PCI of SVG-LPDA stented his vein graft with a 3.5 mm drug-eluting stent using distal protection.  [name] Kitchen Hx of CABG   . Hyperlipidemia associated with type 2 diabetes mellitus (HCC)   . Hypertension   . Tobacco abuse   . Type II diabetes mellitus with complication (HCC)    History of stroke and CAD-CABG    Patient Active Problem List   Diagnosis Date Noted  . Leg edema, right 04/19/2018  . History of CVA (cerebrovascular accident) 04/11/2018  . CAD S/P percutaneous coronary angioplasty 04/11/2018  . Pleuritic chest pain 04/11/2018  . Hx of CABG 04/03/2018  . Ischemic cardiomyopathy - 04/03/2018  . Tobacco abuse   . Acute ST elevation myocardial infarction (STEMI) of posterior wall (HCC) 04/02/2018  . Ventricular tachycardia, sustained (HCC) -n setting of STEMI   . Essential hypertension 07/05/2016  . Diabetes mellitus type 2, controlled, with complications (HCC) 07/05/2016  . Hyperlipidemia LDL goal <70 07/05/2016  . Cigarette smoker 07/05/2016  . Marijuana abuse 07/05/2016  . Obesity 07/05/2016  . Rheumatoid arthritis (HCC) 07/05/2016  . Cerebral infarction (HCC) - L posterior limb internal capsule d/t small vessel disease, s/p IV tPA 07/02/2016    Past Surgical History:  Procedure Laterality Date  . CORONARY ARTERY BYPASS GRAFT    . CORONARY/GRAFT ACUTE MI REVASCULARIZATION N/A 04/02/2018   Procedure: Coronary/Graft Acute MI Revascularization;  Surgeon: [name], MD;  Location: [facility] INVASIVE CV LAB;  Service: Cardiovascular: Thrombotic 95% SVG-L CLE:XNTZ - thrombectomy -> DES PCI Synergy DES 3.5 x 20 (3.9 mm)  . KNEE SURGERY    . LEFT HEART CATH AND CORS/GRAFTS ANGIOGRAPHY N/A 04/02/2018   Procedure: LEFT HEART CATH AND CORS/GRAFTS ANGIOGRAPHY;  Surgeon: [name], MD;  Location: [facility] INVASIVE CV LAB;  Service: Cardiovascular: Ostial Left  Main 50%; distal Left Main-pLAD 100% - d(apical)LAD 95 (after patent LIMA-LAD).OstRI - 99%; ost-mLCx 95%, dCx 95% & OM2 95%; ost-pRCA (co-dominant) 99%mRCA 80%; SVG-LPDA 99% ostial thrombosis; EF 30% with a EDP 31 mmHg  . MUSCLE REPAIR    . TONSILLECTOMY          Home Medications    Prior to Admission medications   Medication Sig Start Date End Date Taking? Authorizing Provider  amiodarone (PACERONE) 200 MG tablet Take 400mg  (2 tablets) twice daily for 5 days,  then 200mg  (1 tablet) twice daily for 2 weeks, then 200mg  (1 tablet) daily. 04/05/18   Arty Baumgartner, NP  aspirin 81 MG chewable tablet Chew 1 tablet (81 mg total) by mouth daily. 04/06/18   Arty Baumgartner, NP  atorvastatin (LIPITOR) 80 MG tablet Take 1 tablet (80 mg total) by mouth daily at 6 PM. 04/05/18   Arty Baumgartner, NP  cephALEXin (KEFLEX) 500 MG capsule Take 1 capsule (500 mg total) by mouth 2 (two) times daily. 04/19/18   Abelino Derrick, PA-C  empagliflozin (JARDIANCE) 10 MG TABS tablet Take 10 mg by mouth daily. 04/05/18   Arty Baumgartner, NP  furosemide (LASIX) 20 MG tablet Take 1 tablet (20 mg total) by mouth daily for 7 days. 04/18/18 04/25/18  Aviva Kluver B, PA-C  Lancets [facility] ULTRASOFT) lancets Use as instructed 07/05/16   Layne Benton, NP  metFORMIN (GLUCOPHAGE) 500 MG tablet Take 1 tablet (500 mg total) by mouth 2 (two) times daily with a meal. 04/11/18   Kilroy, Eda Paschal, PA-C  nitroGLYCERIN (NITROSTAT) 0.4 MG SL tablet Place 1 tablet (0.4 mg total) under the tongue every 5 (five) minutes as needed. 04/05/18   Arty Baumgartner, NP  ticagrelor (BRILINTA) 90 MG TABS tablet Take by mouth 2 (two) times daily.    [provider]  traMADol (ULTRAM) 50 MG tablet Take 1 tablet (50 mg total) by mouth every 6 (six) hours as needed. 04/26/18   Abelino Derrick, PA-C  valsartan (DIOVAN) 40 MG tablet Take 1 tablet (40 mg total) by mouth daily. 04/05/18 07/04/18  Arty Baumgartner, NP    Family History History reviewed. No pertinent family history.  Social History Social History   Tobacco Use  . Smoking status: Current Every Day Smoker  . Smokeless tobacco: Never Used  Substance Use Topics  . Alcohol use: No  . Drug use: Yes    Types: Marijuana     Allergies   Codeine and Penicillins   Review of Systems Review of Systems  Unable to perform ROS: Other     Physical Exam Updated Vital Signs BP 136/69 (BP Location: Right Arm)   Pulse 70   Temp 97.6 F  (36.4 C) (Oral)   Resp 20   SpO2 100%   Physical Exam  Constitutional: He is oriented to person, place, and time. He appears well-developed and well-nourished.  HENT:  Head: Normocephalic.  Neck: Normal range of motion.  Pulmonary/Chest: Effort normal. No respiratory distress.  Musculoskeletal:  Ambulatory with limping gait of the right lower extemity. Portions of right leg able to be visualized despite shorts and high socks demonstrate no obvious discoloration  Neurological: He is alert and oriented to person, place, and time.  Psychiatric:  Angry and agitated  Nursing note and vitals reviewed.    ED Treatments / Results  Labs (all labs ordered are listed, but only abnormal results are displayed) Labs Reviewed - No data to display  EKG None  Radiology No results found.  Procedures Procedures (including critical care time)  Medications Ordered in ED Medications - No data to display   Initial Impression / Assessment and Plan / ED Course  I have reviewed the triage vital signs and the nursing notes.  Pertinent labs & imaging results that were available during my care of the patient were reviewed by me and considered in my medical decision making (see chart for details).     Unable to convince pt to stay for evaluation. Leaving AMA. Refused to sign paperwork. Ambulatory. Encouraged to return to ER if he changes his mind.   Final Clinical Impressions(s) / ED Diagnoses   Final diagnoses:  None    ED Discharge Orders    None       Azalia Bilis, MD 04/28/18 [phone]

## 2018-04-28 NOTE — ED Notes (Signed)
I informed pt if he would get undress in a gown so the provider can get a full assessment. Pt stated "no he does not need to get undress if we are just looking at his legs and getting a CT scan". I informed pt that everyone comes back and get undress so that provider can get a full assessment. Pt stated no he not going to do it sign he out AMA . I informed ED provider of pt.

## 2018-04-28 NOTE — ED Triage Notes (Signed)
Pt had a cardiac catheterization on 5/12 and has had ongoing issues since that procedure with lower leg, had doppler study completed 5/28

## 2018-04-28 NOTE — ED Notes (Signed)
Pt seen ambulating out of ED, MD Campos at his side attempting to convince patient to stay for care. Pt refused, ambulated out of ED independently despite encouragement from staff to stay.

## 2018-04-28 NOTE — ED Notes (Signed)
Went in to see patient. He states "get me out of here"  Patient is encouraged to be patient to see a provider.

## 2018-04-28 NOTE — ED Triage Notes (Signed)
Pt sent by PCP for right leg CT scan to r/o compartment syndrome, pt with swelling to right lower leg, redness noted to lower leg and right foot

## 2018-05-09 ENCOUNTER — Other Ambulatory Visit: Payer: Self-pay | Admitting: Cardiology

## 2018-05-09 NOTE — Telephone Encounter (Signed)
This is Dr. Hazle Coca patient. Please address.

## 2018-05-15 ENCOUNTER — Ambulatory Visit: Payer: Medicaid Other | Admitting: Cardiology

## 2018-05-19 ENCOUNTER — Encounter: Payer: Medicaid Other | Admitting: Vascular Surgery

## 2018-05-19 ENCOUNTER — Telehealth: Payer: Self-pay | Admitting: Cardiology

## 2018-05-19 ENCOUNTER — Inpatient Hospital Stay (HOSPITAL_COMMUNITY): Admission: RE | Admit: 2018-05-19 | Payer: Medicaid Other | Source: Ambulatory Visit

## 2018-05-19 NOTE — Telephone Encounter (Signed)
New Message    Patient is calling because he does not feel like himself. He states that his sleep pattern is sleep an hour and then up an hour. He says he is very fatigue and sluggish. As well as snappy. Please call to discuss.

## 2018-05-19 NOTE — Telephone Encounter (Signed)
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  He is unable to sleep.  He states he will fall asleep for 15 mins and wake up, unable to go back to sleep. He feels like the lack of sleep is getting to him and causing his emotions to be up and down.  He feels like he is on an emotional roller coaster.   Denies snoring, waking up gasping for air, frequent urination at night.   He also states he has noticed some chest pain with exertion x 3 weeks.    He walks at night and states he has noticed a tightness in his chest, this resolves with rest.   Denies SOB, dizziness, lightheadedness.    Requesting appt sooner than scheduled.     appt rescheduled for Tuesday 7/2 at 8:30 AM with [name] PA.   Patient aware and verbalized understanding.     FYI-patient does not have primary MD to discuss with-he is awaiting Medicaid card in the mail that may take 1 month to receive.

## 2018-05-23 ENCOUNTER — Encounter: Payer: Self-pay | Admitting: Cardiology

## 2018-05-23 ENCOUNTER — Ambulatory Visit (INDEPENDENT_AMBULATORY_CARE_PROVIDER_SITE_OTHER): Payer: Medicaid Other | Admitting: Cardiology

## 2018-05-23 VITALS — BP 122/80 | HR 56 | Ht 73.0 in | Wt 242.0 lb

## 2018-05-23 DIAGNOSIS — I472 Ventricular tachycardia, unspecified: Secondary | ICD-10-CM

## 2018-05-23 DIAGNOSIS — E785 Hyperlipidemia, unspecified: Secondary | ICD-10-CM | POA: Diagnosis not present

## 2018-05-23 DIAGNOSIS — I10 Essential (primary) hypertension: Secondary | ICD-10-CM | POA: Diagnosis not present

## 2018-05-23 DIAGNOSIS — Z951 Presence of aortocoronary bypass graft: Secondary | ICD-10-CM

## 2018-05-23 DIAGNOSIS — F419 Anxiety disorder, unspecified: Secondary | ICD-10-CM

## 2018-05-23 DIAGNOSIS — E118 Type 2 diabetes mellitus with unspecified complications: Secondary | ICD-10-CM

## 2018-05-23 DIAGNOSIS — Z8673 Personal history of transient ischemic attack (TIA), and cerebral infarction without residual deficits: Secondary | ICD-10-CM

## 2018-05-23 LAB — TSH: TSH: 2.9 u[IU]/mL (ref 0.450–4.500)

## 2018-05-23 NOTE — Patient Instructions (Signed)
Medication Instructions:  DECREASE- Amiodarone 100 mg daily  If you need a refill on your cardiac medications before your next appointment, please call your pharmacy.  Labwork: TSH Today HERE IN OUR OFFICE AT [facility]  Take the provided lab slips with you to the lab for your blood draw.   You will NOT need to fast   Testing/Procedures: None Ordered  Special Instructions: [place] [facility]- [phone]  Follow-Up: Your physician wants you to follow-up in: 6 Weeks with Dr Allyson Sabal.      Thank you for choosing [facility]!!

## 2018-05-23 NOTE — Progress Notes (Signed)
05/23/2018 Matthew Moses   09-30-1951  [id number]  Primary Physician System, Provider Not In Primary Cardiologist: Dr Allyson Sabal  HPI:  67 y/o male with known CAD, s/p prior CABG presented with VT arrest 04/02/18. He had been off all his medications. He received a PCI to his SVG-PD. He did well post PCI.  Medications were added including treatment for his DM. A f/u echo showed improvement in his LVF to 40-45%.When seen in f/u  04/11/18 he complained of pain and swelling of his Rt thigh. He had problems with hemostasis post PCI. He then presented to the ED 04/18/18 with pain in his Rt lower leg and discoloration of toes. He was evaluated by Dr Anne Fu in the ED. Venous dopplers were negative for DVT, arterial dopplers negative for pseudoaneurysm or compromised arterial flow. He was in the office 04/19/18 and seen by Dr Allyson Sabal. He was placed on antibiotics and compression stockings. He continued to have pain and we were concerned he may have compartment syndrome. He was sent to the ED for LE CT and evaluation. He became upset with the RN who checked him in and walked out.   He called recently with complaints of trouble sleeping. He was added on to my scheduled today. He has lots of stress at home, his girlfriend just had surgery and his dog need an operation that will cost 3K dollars that they don't have. He has been taking his medications. He says he only sleeps an hour or two a night. He has photophobia and has to wear sunglasses all the time.     Current Outpatient Medications  Medication Sig Dispense Refill  . amiodarone (PACERONE) 100 MG tablet Take 100 mg by mouth daily.    [name] Kitchen aspirin 81 MG chewable tablet Chew 1 tablet (81 mg total) by mouth daily.    [name] Kitchen atorvastatin (LIPITOR) 80 MG tablet Take 1 tablet (80 mg total) by mouth daily at 6 PM. 90 tablet 1  . empagliflozin (JARDIANCE) 10 MG TABS tablet Take 10 mg by mouth daily. 30 tablet 1  . Lancets (ONETOUCH ULTRASOFT) lancets Use as instructed 100  each 12  . metFORMIN (GLUCOPHAGE) 500 MG tablet Take 1 tablet (500 mg total) by mouth 2 (two) times daily with a meal. 180 tablet 4  . nitroGLYCERIN (NITROSTAT) 0.4 MG SL tablet Place 1 tablet (0.4 mg total) under the tongue every 5 (five) minutes as needed. 25 tablet 2  . ticagrelor (BRILINTA) 90 MG TABS tablet Take by mouth 2 (two) times daily.    . traMADol (ULTRAM) 50 MG tablet Take 1 tablet (50 mg total) by mouth every 6 (six) hours as needed. 28 tablet 0  . valsartan (DIOVAN) 40 MG tablet Take 1 tablet (40 mg total) by mouth daily. 30 tablet 2   No current facility-administered medications for this visit.     Allergies  Allergen Reactions  . Codeine Hives  . Penicillins Itching and Rash    Has patient had a PCN reaction causing immediate rash, facial/tongue/throat swelling, SOB or lightheadedness with hypotension: Yes Has patient had a PCN reaction causing severe rash involving mucus membranes or skin necrosis: No Has patient had a PCN reaction that required hospitalization: No Has patient had a PCN reaction occurring within the last 10 years: No If all of the above answers are "NO", then may proceed with Cephalosporin use.     Past Medical History:  Diagnosis Date  . CAD, multiple vessel    Severe native CAD involving  left main, LAD, RCA, ramus intermedius and PDA -> status post CABG x2 (LIMA-LAD, SVG-L PDA)  . Coronary artery disease involving coronary bypass graft of native heart with angina pectoris (HCC) - thrombotic 95% oclusion of SVG-LPDA 04/03/2018   occluded LAD, subtotally occluded nondominant to codominant RCA, LCx & RI.  LIMA-LAD was intact. CULPRIT LESION was the SVG-dLPLA that had a high-grade proximal thrombotic stenosis.  S/P successful PCI of SVG-LPDA stented his vein graft with a 3.5 mm drug-eluting stent using distal protection.  [name] Kitchen Hx of CABG   . Hyperlipidemia associated with type 2 diabetes mellitus (HCC)   . Hypertension   . Tobacco abuse   . Type II  diabetes mellitus with complication (HCC)    History of stroke and CAD-CABG    Social History   Socioeconomic History  . Marital status: Single    Spouse name: Not on file  . Number of children: Not on file  . Years of education: Not on file  . Highest education level: Not on file  Occupational History  . Not on file  Social Needs  . Financial resource strain: Not on file  . Food insecurity:    Worry: Not on file    Inability: Not on file  . Transportation needs:    Medical: Not on file    Non-medical: Not on file  Tobacco Use  . Smoking status: Current Every Day Smoker  . Smokeless tobacco: Never Used  Substance and Sexual Activity  . Alcohol use: No  . Drug use: Yes    Types: Marijuana  . Sexual activity: Not on file  Lifestyle  . Physical activity:    Days per week: Not on file    Minutes per session: Not on file  . Stress: Not on file  Relationships  . Social connections:    Talks on phone: Not on file    Gets together: Not on file    Attends religious service: Not on file    Active member of club or organization: Not on file    Attends meetings of clubs or organizations: Not on file    Relationship status: Not on file  . Intimate partner violence:    Fear of current or ex partner: Not on file    Emotionally abused: Not on file    Physically abused: Not on file    Forced sexual activity: Not on file  Other Topics Concern  . Not on file  Social History Narrative  . Not on file    FM HX- mother had CABG in her 43's, died 72's  Review of Systems: General: negative for chills, fever, night sweats or weight changes.  Cardiovascular: negative for chest pain, dyspnea on exertion, edema, orthopnea, palpitations, paroxysmal nocturnal dyspnea or shortness of breath Dermatological: negative for rash Respiratory: negative for cough or wheezing Urologic: negative for hematuria Abdominal: negative for nausea, vomiting, diarrhea, bright red blood per rectum, melena,  or hematemesis Neurologic: syncope, or dizziness All other systems reviewed and are otherwise negative except as noted above.    Blood pressure 122/80, pulse (!) 56, height 6\' 1"  (1.854 m), weight 242 lb (109.8 kg).  General appearance: alert, cooperative and anxious Neck: no JVD Lungs: clear to auscultation bilaterally Heart: regular rate and rhythm Extremities: extremities normal, atraumatic, no cyanosis or edema Skin: Skin color, texture, turgor normal. No rashes or lesions Neurologic: Grossly normal  EKG NSR, 56, LAD  ASSESSMENT AND PLAN:   Anxiety Pt called and asked to be seen.  He says he can't sleep, lots of stress at home.  Leg edema, right Doppler negative for pseudoaneurysm, DVT, and arterial dopplers negative for distal restriction.  His leg pain has gradually resolved  Ventricular tachycardia, sustained (HCC) -n setting of STEMI Pt discharged on Amiodarone. He has photophobia and anxiety- ? hyperthyroid  Ischemic cardiomyopathy - EF 30% by LV gram, improved to 40-45% by post MI echo  Hx of CABG S/P CABG in 2012 [place]- no records  CAD S/P percutaneous coronary angioplasty S/P emergency PCI with DES to SVG-LPDA  04/02/18 S/P CABG in 2012 [place]- no records  Acute ST elevation myocardial infarction (STEMI) of posterior wall (HCC) S/P acute MI with cardiac arrest 04/02/18 and VT requiring cardioversion   PLAN  Check TSH, I referred him to [facility]line. He asked for Xanax which I was not comfortable prescribing.  F/U Dr Allyson Sabal in 6 weeks. He is working on getting a PCP- waiting on [employer] PA-C 05/23/2018 9:02 AM

## 2018-05-23 NOTE — Assessment & Plan Note (Signed)
Pt called and asked to be seen. He says he can't sleep, lots of stress at home.

## 2018-05-29 NOTE — Addendum Note (Signed)
Addended by: Barrie Dunker on: 05/29/2018 09:47 AM   Modules accepted: Orders

## 2018-06-05 DIAGNOSIS — I25810 Atherosclerosis of coronary artery bypass graft(s) without angina pectoris: Secondary | ICD-10-CM

## 2018-06-05 DIAGNOSIS — E1165 Type 2 diabetes mellitus with hyperglycemia: Secondary | ICD-10-CM | POA: Insufficient documentation

## 2018-06-05 DIAGNOSIS — I25709 Atherosclerosis of coronary artery bypass graft(s), unspecified, with unspecified angina pectoris: Secondary | ICD-10-CM | POA: Insufficient documentation

## 2018-06-05 DIAGNOSIS — I472 Ventricular tachycardia, unspecified: Secondary | ICD-10-CM

## 2018-06-06 ENCOUNTER — Other Ambulatory Visit: Payer: Self-pay | Admitting: Cardiology

## 2018-06-08 ENCOUNTER — Ambulatory Visit: Payer: Medicaid Other | Admitting: Cardiology

## 2018-06-09 DIAGNOSIS — G8929 Other chronic pain: Secondary | ICD-10-CM | POA: Insufficient documentation

## 2018-06-09 DIAGNOSIS — F17200 Nicotine dependence, unspecified, uncomplicated: Secondary | ICD-10-CM | POA: Insufficient documentation

## 2018-06-14 ENCOUNTER — Ambulatory Visit: Payer: Medicaid Other | Admitting: Cardiology

## 2018-07-04 ENCOUNTER — Ambulatory Visit: Payer: Medicaid Other | Admitting: Cardiovascular Disease

## 2018-07-04 ENCOUNTER — Telehealth: Payer: Self-pay | Admitting: Cardiology

## 2018-07-04 NOTE — Telephone Encounter (Signed)
New Message   Maud Deed PA with [facility] is calling in reference to patient. She states that the patient is going to go under [facility] because he was giving six months. She would like to discuss this further with [name] since the patient just saw him on the 7/2. Today is her half day but she can be reached on her mobile at [phone] or the nurse Fredonia Highland can be reached at [phone]. Please call.

## 2018-07-04 NOTE — Telephone Encounter (Signed)
Message given to [facility], [place]. Matthew Moses sts that he will return the call.

## 2018-07-13 ENCOUNTER — Telehealth: Payer: Self-pay | Admitting: Cardiovascular Disease

## 2018-07-13 NOTE — Telephone Encounter (Signed)
[id number] 

## 2018-07-13 NOTE — Telephone Encounter (Signed)
New message  Patient calling to report stomach cramps after eating.

## 2018-07-14 NOTE — Telephone Encounter (Signed)
Spoke with pt who states he has not been able to keep any food down and vomits every time he eat. He also has lost 10 lbs in a month. He state he was instructed by his pcp to contact our office but pcp has ordered a Ct scan. Pt already has an appointment scheduled with Dr. Allyson Sabal on 07/19/18. Will route to Dr. Allyson Sabal for further recommendation.

## 2018-07-16 NOTE — Telephone Encounter (Signed)
The only time I've seen this pt was when I cathed him 3 months ago. Franky Macho has seen him in the office. Recommend that he sees Franky Macho back to discuss

## 2018-07-17 NOTE — Telephone Encounter (Signed)
Left message for pt to call.

## 2018-07-19 ENCOUNTER — Ambulatory Visit: Payer: Medicaid Other | Admitting: Cardiovascular Disease

## 2018-07-19 ENCOUNTER — Encounter: Payer: Self-pay | Admitting: Cardiovascular Disease

## 2018-07-19 DIAGNOSIS — E785 Hyperlipidemia, unspecified: Secondary | ICD-10-CM | POA: Diagnosis not present

## 2018-07-19 DIAGNOSIS — I10 Essential (primary) hypertension: Secondary | ICD-10-CM | POA: Diagnosis not present

## 2018-07-19 DIAGNOSIS — F17210 Nicotine dependence, cigarettes, uncomplicated: Secondary | ICD-10-CM | POA: Diagnosis not present

## 2018-07-19 DIAGNOSIS — I255 Ischemic cardiomyopathy: Secondary | ICD-10-CM

## 2018-07-19 DIAGNOSIS — Z951 Presence of aortocoronary bypass graft: Secondary | ICD-10-CM | POA: Diagnosis not present

## 2018-07-19 NOTE — Patient Instructions (Signed)
Medication Instructions:  Your physician recommends that you continue on your current medications as directed. Please refer to the Current Medication list given to you today.   Labwork: none  Testing/Procedures: none  Follow-Up: We request that you follow-up in: 6 months with [name], PA  and in 12 months with with Dr San Morelle will receive a reminder letter in the mail two months in advance. If you don't receive a letter, please call our office to schedule the follow-up appointment.    Any Other Special Instructions Will Be Listed Below (If Applicable).     If you need a refill on your cardiac medications before your next appointment, please call your pharmacy.

## 2018-07-19 NOTE — Assessment & Plan Note (Signed)
EF has improved from 30 up to 40 to 45%.  He is on valsartan.  He is not on a beta-blocker but remains on amiodarone because of his VT/VF.  He may ultimately be able to be transition from amiodarone to carvedilol when he sees [employer] back in the office.

## 2018-07-19 NOTE — Assessment & Plan Note (Signed)
Ongoing tobacco abuse 1 pack/day recalcitrant to risk factor modification. 

## 2018-07-19 NOTE — Assessment & Plan Note (Signed)
History of hyperlipidemia on high-dose statin therapy with recent lipid profile performed 04/03/2018 revealing total cholesterol 140, LDL 93 and HDL 27.

## 2018-07-19 NOTE — Assessment & Plan Note (Signed)
She of remote stroke August 2017.

## 2018-07-19 NOTE — Assessment & Plan Note (Signed)
History of coronary artery bypass grafting at [facility] hospital in 2012.  He presented with a VT/VF arrest 04/02/2018.  I took him urgently to the Cath Lab where I stented and obtuse marginal branch vein graft.  His LIMA was intact.  He had a diffusely diseased RCA with an EF of 30% at that time.  Subsequent echo revealed moderate improvement with an EF of 40 to 45%.  He is completely asymptomatic and remains on Brilinta.

## 2018-07-19 NOTE — Progress Notes (Signed)
07/19/2018 Matthew Moses   Matthew Moses  [id number]  Primary Physician System, Provider Not In Primary Cardiologist: Matthew Gess MD Matthew Moses, [place], [place]  HPI:  Matthew Moses is a 67 y.o. moderately overweight single Caucasian male with no children who does not work.  I am seeing back in follow-up after being seen by Matthew Moses in the office 05/23/2018.  His risk factors include treated hypertension, diabetes and hyperlipidemia.  He has 50 pack years of smoking rarely smoking 1 pack/day.  He had a stroke back in August 2017.  He had bypass grafting at [facility] in 2012.  He presented on 04/02/2018 without VT/VF arrest and was cardioverted.  He was brought urgently to the Cath Lab where I performed cardiac catheterization revealing in the 99% obtuse marginal branch vein graft stenosis which I stented.  His LIMA to his LAD was patent.  His right was diffusely diseased.  His EF was 30% which ultimately improved to 40 to 45% by 2D echo.  He remains on Brilinta.  He was complaining of some right calf pain which has since resolved.   Current Meds  Medication Sig  . amiodarone (PACERONE) 100 MG tablet Take 100 mg by mouth daily.  [name] Kitchen atorvastatin (LIPITOR) 80 MG tablet Take 1 tablet (80 mg total) by mouth daily at 6 PM.  . empagliflozin (JARDIANCE) 10 MG TABS tablet Take 10 mg by mouth daily.  . Lancets (ONETOUCH ULTRASOFT) lancets Use as instructed  . metFORMIN (GLUCOPHAGE) 500 MG tablet Take 1 tablet (500 mg total) by mouth 2 (two) times daily with a meal.  . nitroGLYCERIN (NITROSTAT) 0.4 MG SL tablet Place 1 tablet (0.4 mg total) under the tongue every 5 (five) minutes as needed.  . ticagrelor (BRILINTA) 90 MG TABS tablet Take 1 tablet (90 mg total) by mouth 2 (two) times daily.  . [DISCONTINUED] aspirin 81 MG chewable tablet Chew 1 tablet (81 mg total) by mouth daily.  . [DISCONTINUED] traMADol (ULTRAM) 50 MG tablet Take 1 tablet (50 mg total) by mouth every 6 (six) hours as needed.      Allergies  Allergen Reactions  . Codeine Hives  . Penicillins Itching and Rash    Has patient had a PCN reaction causing immediate rash, facial/tongue/throat swelling, SOB or lightheadedness with hypotension: Yes Has patient had a PCN reaction causing severe rash involving mucus membranes or skin necrosis: No Has patient had a PCN reaction that required hospitalization: No Has patient had a PCN reaction occurring within the last 10 years: No If all of the above answers are "NO", then may proceed with Cephalosporin use.     Social History   Socioeconomic History  . Marital status: Single    Spouse name: Not on file  . Number of children: Not on file  . Years of education: Not on file  . Highest education level: Not on file  Occupational History  . Not on file  Social Needs  . Financial resource strain: Not on file  . Food insecurity:    Worry: Not on file    Inability: Not on file  . Transportation needs:    Medical: Not on file    Non-medical: Not on file  Tobacco Use  . Smoking status: Current Every Day Smoker  . Smokeless tobacco: Never Used  Substance and Sexual Activity  . Alcohol use: No  . Drug use: Yes    Types: Marijuana  . Sexual activity: Not on file  Lifestyle  .  Physical activity:    Days per week: Not on file    Minutes per session: Not on file  . Stress: Not on file  Relationships  . Social connections:    Talks on phone: Not on file    Gets together: Not on file    Attends religious service: Not on file    Active member of club or organization: Not on file    Attends meetings of clubs or organizations: Not on file    Relationship status: Not on file  . Intimate partner violence:    Fear of current or ex partner: Not on file    Emotionally abused: Not on file    Physically abused: Not on file    Forced sexual activity: Not on file  Other Topics Concern  . Not on file  Social History Narrative  . Not on file     Review of  Systems: General: negative for chills, fever, night sweats or weight changes.  Cardiovascular: negative for chest pain, dyspnea on exertion, edema, orthopnea, palpitations, paroxysmal nocturnal dyspnea or shortness of breath Dermatological: negative for rash Respiratory: negative for cough or wheezing Urologic: negative for hematuria Abdominal: negative for nausea, vomiting, diarrhea, bright red blood per rectum, melena, or hematemesis Neurologic: negative for visual changes, syncope, or dizziness All other systems reviewed and are otherwise negative except as noted above.    Blood pressure 123/63, pulse 65, height 6\' 1"  (1.854 m), weight 241 lb 9.6 oz (109.6 kg).  General appearance: alert and no distress Neck: no adenopathy, no carotid bruit, no JVD, supple, symmetrical, trachea midline and thyroid not enlarged, symmetric, no tenderness/mass/nodules Lungs: clear to auscultation bilaterally Heart: regular rate and rhythm, S1, S2 normal, no murmur, click, rub or gallop Extremities: extremities normal, atraumatic, no cyanosis or edema Pulses: 2+ and symmetric Skin: Skin color, texture, turgor normal. No rashes or lesions Neurologic: Alert and oriented X 3, normal strength and tone. Normal symmetric reflexes. Normal coordination and gait  EKG not performed today  ASSESSMENT AND PLAN:   Essential hypertension History of essential hypertension blood pressure measured today 123/63.  He is on valsartan.  Continue current meds at current dosing.  Hyperlipidemia LDL goal <70 History of hyperlipidemia on high-dose statin therapy with recent lipid profile performed 04/03/2018 revealing total cholesterol 140, LDL 93 and HDL 27.  Cigarette smoker Ongoing tobacco abuse 1 pack/day recalcitrant to risk factor modification.  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  Hx of CABG History of coronary artery  bypass grafting at [facility] hospital in 2012.  He presented with a VT/VF arrest 04/02/2018.  I took him urgently to the Cath Lab where I stented and obtuse marginal branch vein graft.  His LIMA was intact.  He had a diffusely diseased RCA with an EF of 30% at that time.  Subsequent echo revealed moderate improvement with an EF of 40 to 45%.  He is completely asymptomatic and remains on Brilinta.  Ischemic cardiomyopathy - EF has improved from 30 up to 40 to 45%.  He is on valsartan.  He is not on a beta-blocker but remains on amiodarone because of his VT/VF.  He may ultimately be able to be transition from amiodarone to carvedilol when he sees 06/02/2018 back in the office.      [employer] MD FACP,FACC,FAHA, [facility] 07/19/2018 12:24 PM

## 2018-07-19 NOTE — Assessment & Plan Note (Signed)
History of essential hypertension blood pressure measured today 123/63.  He is on valsartan.  Continue current meds at current dosing.

## 2018-07-19 NOTE — Telephone Encounter (Signed)
Patient was seen today by dr Allyson Sabal

## 2018-12-19 ENCOUNTER — Encounter (HOSPITAL_BASED_OUTPATIENT_CLINIC_OR_DEPARTMENT_OTHER): Payer: Self-pay | Admitting: *Deleted

## 2018-12-19 ENCOUNTER — Emergency Department (HOSPITAL_BASED_OUTPATIENT_CLINIC_OR_DEPARTMENT_OTHER): Payer: Medicare Other

## 2018-12-19 ENCOUNTER — Telehealth: Payer: Self-pay | Admitting: Cardiovascular Disease

## 2018-12-19 ENCOUNTER — Other Ambulatory Visit: Payer: Self-pay

## 2018-12-19 ENCOUNTER — Emergency Department (HOSPITAL_BASED_OUTPATIENT_CLINIC_OR_DEPARTMENT_OTHER)
Admission: EM | Admit: 2018-12-19 | Discharge: 2018-12-19 | Discharge status: Against Medical Advice | Payer: Medicare Other | Attending: Emergency Medicine | Admitting: Emergency Medicine

## 2018-12-19 DIAGNOSIS — R079 Chest pain, unspecified: Secondary | ICD-10-CM | POA: Diagnosis present

## 2018-12-19 DIAGNOSIS — F17200 Nicotine dependence, unspecified, uncomplicated: Secondary | ICD-10-CM | POA: Diagnosis not present

## 2018-12-19 DIAGNOSIS — I200 Unstable angina: Secondary | ICD-10-CM | POA: Insufficient documentation

## 2018-12-19 DIAGNOSIS — E119 Type 2 diabetes mellitus without complications: Secondary | ICD-10-CM | POA: Diagnosis not present

## 2018-12-19 DIAGNOSIS — Z7984 Long term (current) use of oral hypoglycemic drugs: Secondary | ICD-10-CM | POA: Insufficient documentation

## 2018-12-19 DIAGNOSIS — Z5320 Procedure and treatment not carried out because of patient's decision for unspecified reasons: Secondary | ICD-10-CM | POA: Diagnosis not present

## 2018-12-19 DIAGNOSIS — F1210 Cannabis abuse, uncomplicated: Secondary | ICD-10-CM | POA: Diagnosis not present

## 2018-12-19 DIAGNOSIS — R0602 Shortness of breath: Secondary | ICD-10-CM | POA: Insufficient documentation

## 2018-12-19 DIAGNOSIS — I10 Essential (primary) hypertension: Secondary | ICD-10-CM | POA: Diagnosis not present

## 2018-12-19 DIAGNOSIS — Z79899 Other long term (current) drug therapy: Secondary | ICD-10-CM | POA: Diagnosis not present

## 2018-12-19 DIAGNOSIS — Z951 Presence of aortocoronary bypass graft: Secondary | ICD-10-CM | POA: Insufficient documentation

## 2018-12-19 LAB — PROTIME-INR
INR: 0.88
Prothrombin Time: 11.9 seconds (ref 11.4–15.2)

## 2018-12-19 LAB — CBC
HCT: 48.2 % (ref 39.0–52.0)
Hemoglobin: 15.9 g/dL (ref 13.0–17.0)
MCH: 28.6 pg (ref 26.0–34.0)
MCHC: 33 g/dL (ref 30.0–36.0)
MCV: 86.7 fL (ref 80.0–100.0)
NRBC: 0 % (ref 0.0–0.2)
Platelets: 224 10*3/uL (ref 150–400)
RBC: 5.56 MIL/uL (ref 4.22–5.81)
RDW: 14.6 % (ref 11.5–15.5)
WBC: 10.9 10*3/uL — ABNORMAL HIGH (ref 4.0–10.5)

## 2018-12-19 LAB — COMPREHENSIVE METABOLIC PANEL
ALT: 30 U/L (ref 0–44)
AST: 26 U/L (ref 15–41)
Albumin: 4.2 g/dL (ref 3.5–5.0)
Alkaline Phosphatase: 68 U/L (ref 38–126)
Anion gap: 11 (ref 5–15)
BUN: 24 mg/dL — ABNORMAL HIGH (ref 8–23)
CO2: 25 mmol/L (ref 22–32)
Calcium: 9.5 mg/dL (ref 8.9–10.3)
Chloride: 99 mmol/L (ref 98–111)
Creatinine, Ser: 0.8 mg/dL (ref 0.61–1.24)
GFR calc Af Amer: >60 mL/min (ref >60)
GFR calc non Af Amer: >60 mL/min (ref >60)
GLUCOSE: 148 mg/dL — AB (ref 70–99)
Potassium: 4 mmol/L (ref 3.5–5.1)
Sodium: 135 mmol/L (ref 135–145)
Total Bilirubin: 0.8 mg/dL (ref 0.3–1.2)
Total Protein: 8 g/dL (ref 6.5–8.1)

## 2018-12-19 LAB — TROPONIN I: Troponin I: <0.03 ng/mL (ref <0.03)

## 2018-12-19 MED ORDER — MORPHINE SULFATE (PF) 4 MG/ML IV SOLN
4.0000 mg | Freq: Once | INTRAVENOUS | Status: AC
  Administered 2018-12-19: 4 mg via INTRAVENOUS
  Filled 2018-12-19: qty 1

## 2018-12-19 MED ORDER — ONDANSETRON HCL 4 MG/2ML IJ SOLN
4.0000 mg | Freq: Once | INTRAMUSCULAR | Status: AC
  Administered 2018-12-19: 4 mg via INTRAVENOUS
  Filled 2018-12-19: qty 2

## 2018-12-19 MED ORDER — SODIUM CHLORIDE 0.9% FLUSH
3.0000 mL | Freq: Once | INTRAVENOUS | Status: DC
  Filled 2018-12-19: qty 3

## 2018-12-19 MED ORDER — HEPARIN BOLUS VIA INFUSION
4000.0000 [IU] | Freq: Once | INTRAVENOUS | Status: AC
  Administered 2018-12-19: 4000 [IU] via INTRAVENOUS

## 2018-12-19 MED ORDER — HEPARIN (PORCINE) 25000 UT/250ML-% IV SOLN
1200.0000 [IU]/h | INTRAVENOUS | Status: DC
  Administered 2018-12-19: 1200 [IU]/h via INTRAVENOUS
  Filled 2018-12-19: qty 250

## 2018-12-19 MED ORDER — ASPIRIN 81 MG PO CHEW
324.0000 mg | CHEWABLE_TABLET | Freq: Once | ORAL | Status: DC
  Filled 2018-12-19: qty 4

## 2018-12-19 MED ORDER — SODIUM CHLORIDE 0.9 % IV SOLN
INTRAVENOUS | Status: DC | PRN
  Administered 2018-12-19: 500 mL via INTRAVENOUS

## 2018-12-19 NOTE — ED Notes (Signed)
ED Provider at bedside. 

## 2018-12-19 NOTE — ED Provider Notes (Signed)
[facility] EMERGENCY DEPARTMENT Provider Note   CSN: [id number] Arrival date & time: 12/19/18  1228     History   Chief Complaint Chief Complaint  Patient presents with  . Chest Pain    HPI Matthew Moses is a 68 y.o. male.  The history is provided by the patient and medical records. No language interpreter was used.  Chest Pain   Matthew Moses is a 68 y.o. male who presents to the Emergency Department complaining of chest pain. Since the emergency department for evaluation of chest pain for the last two hours. Pain is heavy in nature and is across his entire chest. He describes it as an elephant sitting on his chest. He has associated shortness of breath. Four days ago he was ill with nausea, vomiting, diarrhea, symptoms now resolved. He denies any fevers, abdominal pain. He does have shortness of breath that is worse upon breathing in. He states this feels like his heart attack where he had an arrest back in May. He is compliant with his medications. Past Medical History:  Diagnosis Date  . CAD, multiple vessel    Severe native CAD involving left main, LAD, RCA, ramus intermedius and PDA -> status post CABG x2 (LIMA-LAD, SVG-L PDA)  . Coronary artery disease involving coronary bypass graft of native heart with angina pectoris (HCC) - thrombotic 95% oclusion of SVG-LPDA 04/03/2018   occluded LAD, subtotally occluded nondominant to codominant RCA, LCx & RI.  LIMA-LAD was intact. CULPRIT LESION was the SVG-dLPLA that had a high-grade proximal thrombotic stenosis.  S/P successful PCI of SVG-LPDA stented his vein graft with a 3.5 mm drug-eluting stent using distal protection.  [name] Kitchen Hx of CABG   . Hyperlipidemia associated with type 2 diabetes mellitus (HCC)   . Hypertension   . Tobacco abuse   . Type II diabetes mellitus with complication (HCC)    History of stroke and CAD-CABG    Patient Active Problem List   Diagnosis Date Noted  . Anxiety 05/23/2018  . Leg edema, right  04/19/2018  . History of CVA (cerebrovascular accident) 04/11/2018  . CAD S/P percutaneous coronary angioplasty 04/11/2018  . Pleuritic chest pain 04/11/2018  . Hx of CABG 04/03/2018  . Ischemic cardiomyopathy - 04/03/2018  . Tobacco abuse   . Acute ST elevation myocardial infarction (STEMI) of posterior wall (HCC) 04/02/2018  . Ventricular tachycardia, sustained (HCC) -n setting of STEMI   . Essential hypertension 07/05/2016  . Diabetes mellitus type 2, controlled, with complications (HCC) 07/05/2016  . Hyperlipidemia LDL goal <70 07/05/2016  . Cigarette smoker 07/05/2016  . Marijuana abuse 07/05/2016  . Obesity 07/05/2016  . Rheumatoid arthritis (HCC) 07/05/2016  . Cerebral infarction (HCC) - L posterior limb internal capsule d/t small vessel disease, s/p IV tPA 07/02/2016    Past Surgical History:  Procedure Laterality Date  . CORONARY ARTERY BYPASS GRAFT    . CORONARY/GRAFT ACUTE MI REVASCULARIZATION N/A 04/02/2018   Procedure: Coronary/Graft Acute MI Revascularization;  Surgeon: [name], MD;  Location: [facility] INVASIVE CV LAB;  Service: Cardiovascular: Thrombotic 95% SVG-L KNL:[id number] - thrombectomy -> DES PCI Synergy DES 3.5 x 20 (3.9 mm)  . KNEE SURGERY    . LEFT HEART CATH AND CORS/GRAFTS ANGIOGRAPHY N/A 04/02/2018   Procedure: LEFT HEART CATH AND CORS/GRAFTS ANGIOGRAPHY;  Surgeon: [name], MD;  Location: [facility] INVASIVE CV LAB;  Service: Cardiovascular: Ostial Left Main 50%; distal Left Main-pLAD 100% - d(apical)LAD 95 (after patent LIMA-LAD).OstRI - 99%; ost-mLCx 95%, dCx 95% &  OM2 95%; ost-pRCA (co-dominant) 99%mRCA 80%; SVG-LPDA 99% ostial thrombosis; EF 30% with a EDP 31 mmHg  . MUSCLE REPAIR    . TONSILLECTOMY          Home Medications    Prior to Admission medications   Medication Sig Start Date End Date Taking? Authorizing Provider  amiodarone (PACERONE) 100 MG tablet Take 100 mg by mouth daily.    [provider]  atorvastatin (LIPITOR) 80 MG  tablet Take 1 tablet (80 mg total) by mouth daily at 6 PM. 04/05/18   Arty Baumgartneroberts, Lindsay B, NP  empagliflozin (JARDIANCE) 10 MG TABS tablet Take 10 mg by mouth daily. 04/05/18   Arty Baumgartneroberts, Lindsay B, NP  Lancets [facility](ONETOUCH ULTRASOFT) lancets Use as instructed 07/05/16   Layne BentonBiby, Sharon L, NP  metFORMIN (GLUCOPHAGE) 500 MG tablet Take 1 tablet (500 mg total) by mouth 2 (two) times daily with a meal. 04/11/18   Kilroy, Eda PaschalLuke K, PA-C  nitroGLYCERIN (NITROSTAT) 0.4 MG SL tablet Place 1 tablet (0.4 mg total) under the tongue every 5 (five) minutes as needed. 04/05/18   Arty Baumgartneroberts, Lindsay B, NP  ticagrelor (BRILINTA) 90 MG TABS tablet Take 1 tablet (90 mg total) by mouth 2 (two) times daily. 06/06/18   Runell GessBerry, Jonathan J, MD  valsartan (DIOVAN) 40 MG tablet Take 1 tablet (40 mg total) by mouth daily. 04/05/18 07/04/18  Arty Baumgartneroberts, Lindsay B, NP    Family History Family History  Problem Relation Age of Onset  . CAD Mother        CABG 3650's, died 6360's    Social History Social History   Tobacco Use  . Smoking status: Current Every Day Smoker  . Smokeless tobacco: Never Used  Substance Use Topics  . Alcohol use: No  . Drug use: Yes    Types: Marijuana     Allergies   Codeine and Penicillins   Review of Systems Review of Systems  Cardiovascular: Positive for chest pain.  All other systems reviewed and are negative.    Physical Exam Updated Vital Signs BP (!) 124/92   Pulse 74   Temp 98 F (36.7 C) (Oral)   Resp 20   Ht 6\' 1"  (1.854 m)   Wt 108.9 kg   SpO2 100%   BMI 31.66 kg/m   Physical Exam Vitals signs and nursing note reviewed.  Constitutional:      Appearance: He is well-developed.  HENT:     Head: Normocephalic and atraumatic.  Cardiovascular:     Rate and Rhythm: Normal rate and regular rhythm.     Heart sounds: No murmur.  Pulmonary:     Effort: Pulmonary effort is normal. No respiratory distress.     Breath sounds: Normal breath sounds.  Abdominal:     Palpations: Abdomen is  soft.     Tenderness: There is no abdominal tenderness. There is no guarding or rebound.  Musculoskeletal:        General: No swelling or tenderness.  Skin:    General: Skin is warm and dry.  Neurological:     Mental Status: He is alert and oriented to person, place, and time.  Psychiatric:     Comments: Mildly agitated but redirectable      ED Treatments / Results  Labs (all labs ordered are listed, but only abnormal results are displayed) Labs Reviewed  CBC - Abnormal; Notable for the following components:      Result Value   WBC 10.9 (*)    All other components within  normal limits  COMPREHENSIVE METABOLIC PANEL - Abnormal; Notable for the following components:   Glucose, Bld 148 (*)    BUN 24 (*)    All other components within normal limits  TROPONIN I  PROTIME-INR    EKG EKG Interpretation  Date/Time:  Tuesday December 19 2018 13:40:11 EST Ventricular Rate:  71 PR Interval:  154 QRS Duration: 101 QT Interval:  418 QTC Calculation: 455 R Axis:   -47 Text Interpretation:  Sinus rhythm LAD, consider left anterior fascicular block Minimal ST depression, inferior leads no significant change since earlier in the day Confirmed by Pricilla Loveless [phone]) on 12/20/2018 8:40:41 AM   Radiology Dg Chest Port 1 View  Result Date: 12/19/2018 CLINICAL DATA:  Chest pain EXAM: PORTABLE CHEST 1 VIEW COMPARISON:  04/02/2018 FINDINGS: There is no focal parenchymal opacity. There is no pleural effusion or pneumothorax. The heart and mediastinal contours are unremarkable. There is evidence of prior CABG. The osseous structures are unremarkable. IMPRESSION: No active disease. Electronically Signed   By: Elige Ko   On: 12/19/2018 13:24    Procedures Procedures (including critical care time)  Medications Ordered in ED Medications  heparin bolus via infusion 4,000 Units (4,000 Units Intravenous Bolus from Bag 12/19/18 1313)  ondansetron (ZOFRAN) injection 4 mg (4 mg Intravenous  Given 12/19/18 1349)  morphine 4 MG/ML injection 4 mg (4 mg Intravenous Given 12/19/18 1349)     Initial Impression / Assessment and Plan / ED Course  I have reviewed the triage vital signs and the nursing notes.  Pertinent labs & imaging results that were available during my care of the patient were reviewed by me and considered in my medical decision making (see chart for details).     Patient here for evaluation of chest pain, shortness of breath. He is very uncomfortable appearing on initial assessment. He refused aspirin, nitrates. He initially refused pain medications as well, but on repeat assessment he is agreeable to pain medications. EKG does have ST depression in the inferior leads concerning for ischemia. Discussed with patient recommendation for admission for further cardiac evaluation is he is high risk for cardiac death. Patient adamantly refuses admission. He denies SI/HI. Discussed risk of discharging risk of death and morbidity and patient understands these risks. He was discharged against medical advice.  Final Clinical Impressions(s) / ED Diagnoses   Final diagnoses:  Unstable angina [facility])    ED Discharge Orders    None       Tilden Fossa, MD 12/20/18 [phone]

## 2018-12-19 NOTE — Progress Notes (Signed)
ANTICOAGULATION CONSULT NOTE - Initial Consult  Pharmacy Consult for heparin Indication: chest pain/ACS  Allergies  Allergen Reactions  . Codeine Hives  . Penicillins Itching and Rash    Has patient had a PCN reaction causing immediate rash, facial/tongue/throat swelling, SOB or lightheadedness with hypotension: Yes Has patient had a PCN reaction causing severe rash involving mucus membranes or skin necrosis: No Has patient had a PCN reaction that required hospitalization: No Has patient had a PCN reaction occurring within the last 10 years: No If all of the above answers are "NO", then may proceed with Cephalosporin use.     Patient Measurements: Height: 6\' 1"  (185.4 cm) Weight: 240 lb (108.9 kg) IBW/kg (Calculated) : 79.9 Heparin Dosing Weight: 102kg  Vital Signs: Temp: 98 F (36.7 C) (01/28 1231) Temp Source: Oral (01/28 1231) BP: 143/124 (01/28 1233) Pulse Rate: 80 (01/28 1231)  Labs: No results for input(s): HGB, HCT, PLT, APTT, LABPROT, INR, HEPARINUNFRC, HEPRLOWMOCWT, CREATININE, CKTOTAL, CKMB, TROPONINI in the last 72 hours.  CrCl cannot be calculated (Patient's most recent lab result is older than the maximum 21 days allowed.).   Medical History: Past Medical History:  Diagnosis Date  . CAD, multiple vessel    Severe native CAD involving left main, LAD, RCA, ramus intermedius and PDA -> status post CABG x2 (LIMA-LAD, SVG-L PDA)  . Coronary artery disease involving coronary bypass graft of native heart with angina pectoris (HCC) - thrombotic 95% oclusion of SVG-LPDA 04/03/2018   occluded LAD, subtotally occluded nondominant to codominant RCA, LCx & RI.  LIMA-LAD was intact. CULPRIT LESION was the SVG-dLPLA that had a high-grade proximal thrombotic stenosis.  S/P successful PCI of SVG-LPDA stented his vein graft with a 3.5 mm drug-eluting stent using distal protection.  [name] Kitchen Hx of CABG   . Hyperlipidemia associated with type 2 diabetes mellitus (HCC)   . Hypertension    . Tobacco abuse   . Type II diabetes mellitus with complication (HCC)    History of stroke and CAD-CABG    Medications:  Infusions:  . heparin      Assessment: 67 yom presented to the ED with CP. To start IV heparin. CBC is pending but has been normal in the past. He is not on anticoagulation PTA.   Goal of Therapy:  Heparin level 0.3-0.7 units/ml Monitor platelets by anticoagulation protocol: Yes   Plan:  Heparin bolus 4000 units IV x 1 Heparin gtt 1200 units/hr Check a 6 hr heparin level Daily heparin level and CBC  Kalob Bergen, Drake Leach 12/19/2018,1:00 PM

## 2018-12-19 NOTE — Telephone Encounter (Signed)
New Message   Pt c/o of Chest Pain: STAT if CP now or developed within 24 hours  1. Are you having CP right now? Yes   2. Are you experiencing any other symptoms (ex. SOB, nausea, vomiting, sweating)? Two days ago patient had diarrhea .   3. How long have you been experiencing CP? 3 or 4 days   4. Is your CP continuous or coming and going? Coming and going   5. Have you taken Nitroglycerin? No  ?

## 2018-12-19 NOTE — ED Notes (Signed)
Pts pump was alerting and when this RN entered to correct pump, pt requested drink. When I requested to check since pt not under my care, pt stated he was going to leave AMA, that he has a long history of cardiac and didn't care, just wanted something to drink. Spoke with EDP DR Madilyn Hook and she stated pt can eat and drink. Pt provided with cup of ice per pt request. Refused offer of food

## 2018-12-19 NOTE — ED Notes (Signed)
Patient and me had an extensive conversation.  He stated that he does not want to know about his medical condition.  He was revived once and does not want to be revived again if it happens.  His parents and first degree cousin died from heart problem.  He don't want to go home and be thinking if he will die in 6 months or so.  He spoke with EDP and he wanted to go home AMA.

## 2018-12-19 NOTE — Telephone Encounter (Signed)
Spoke with pt who states he was just released from the ED due to chest pain and instructed to contact his cardiologist office to make a sooner appointment. Pt states he is still having active chest pain that feels like something is sitting on his chest. He also states he has some SOB due to chest being so tight. Nurse explained to pt that he needs to report back to the ED. Pt states they just released him and said, "what are they going to do about it." Advised pt that even though he was just discharged, if he is having active symptoms, he need to go back. Pt became upset and states he will just keep already scheduled appointment and disconnected the line. Will route to MD to make aware.

## 2018-12-19 NOTE — ED Notes (Signed)
Pt states he is not staying since his EKG looks "OK".

## 2018-12-19 NOTE — ED Notes (Signed)
On arrival pt was cursing staff and had rude behavior.

## 2018-12-19 NOTE — ED Notes (Signed)
EDP is made aware that patient refused to take Aspirin.

## 2018-12-19 NOTE — ED Notes (Signed)
NAD at this time. Pt is leaving AMA.

## 2018-12-19 NOTE — ED Triage Notes (Signed)
Chest pain. States he always has pain but it has been worse over the past 2 days.

## 2018-12-20 NOTE — Telephone Encounter (Signed)
Left message for pt to call to reschedule appointment for sooner time.

## 2018-12-21 ENCOUNTER — Other Ambulatory Visit: Payer: Self-pay

## 2018-12-21 ENCOUNTER — Observation Stay (HOSPITAL_BASED_OUTPATIENT_CLINIC_OR_DEPARTMENT_OTHER)
Admission: EM | Admit: 2018-12-21 | Discharge: 2018-12-22 | Discharge status: Against Medical Advice | Payer: Medicare Other | Attending: Internal Medicine | Admitting: Internal Medicine

## 2018-12-21 ENCOUNTER — Encounter (HOSPITAL_BASED_OUTPATIENT_CLINIC_OR_DEPARTMENT_OTHER): Payer: Self-pay | Admitting: *Deleted

## 2018-12-21 ENCOUNTER — Emergency Department (HOSPITAL_BASED_OUTPATIENT_CLINIC_OR_DEPARTMENT_OTHER): Payer: Medicare Other

## 2018-12-21 DIAGNOSIS — F17210 Nicotine dependence, cigarettes, uncomplicated: Secondary | ICD-10-CM | POA: Insufficient documentation

## 2018-12-21 DIAGNOSIS — E663 Overweight: Secondary | ICD-10-CM | POA: Diagnosis not present

## 2018-12-21 DIAGNOSIS — I25110 Atherosclerotic heart disease of native coronary artery with unstable angina pectoris: Secondary | ICD-10-CM | POA: Diagnosis not present

## 2018-12-21 DIAGNOSIS — E785 Hyperlipidemia, unspecified: Secondary | ICD-10-CM | POA: Diagnosis not present

## 2018-12-21 DIAGNOSIS — I252 Old myocardial infarction: Secondary | ICD-10-CM | POA: Diagnosis not present

## 2018-12-21 DIAGNOSIS — I255 Ischemic cardiomyopathy: Secondary | ICD-10-CM | POA: Diagnosis not present

## 2018-12-21 DIAGNOSIS — Z955 Presence of coronary angioplasty implant and graft: Secondary | ICD-10-CM | POA: Diagnosis not present

## 2018-12-21 DIAGNOSIS — R0789 Other chest pain: Secondary | ICD-10-CM | POA: Diagnosis not present

## 2018-12-21 DIAGNOSIS — I700 Atherosclerosis of aorta: Secondary | ICD-10-CM | POA: Diagnosis not present

## 2018-12-21 DIAGNOSIS — Z880 Allergy status to penicillin: Secondary | ICD-10-CM | POA: Insufficient documentation

## 2018-12-21 DIAGNOSIS — Z794 Long term (current) use of insulin: Secondary | ICD-10-CM | POA: Diagnosis not present

## 2018-12-21 DIAGNOSIS — Z8249 Family history of ischemic heart disease and other diseases of the circulatory system: Secondary | ICD-10-CM | POA: Diagnosis not present

## 2018-12-21 DIAGNOSIS — Z6831 Body mass index (BMI) 31.0-31.9, adult: Secondary | ICD-10-CM | POA: Diagnosis not present

## 2018-12-21 DIAGNOSIS — E119 Type 2 diabetes mellitus without complications: Secondary | ICD-10-CM | POA: Diagnosis not present

## 2018-12-21 DIAGNOSIS — Z885 Allergy status to narcotic agent status: Secondary | ICD-10-CM | POA: Insufficient documentation

## 2018-12-21 DIAGNOSIS — I10 Essential (primary) hypertension: Secondary | ICD-10-CM | POA: Insufficient documentation

## 2018-12-21 DIAGNOSIS — Z66 Do not resuscitate: Secondary | ICD-10-CM | POA: Diagnosis not present

## 2018-12-21 DIAGNOSIS — E7800 Pure hypercholesterolemia, unspecified: Secondary | ICD-10-CM | POA: Diagnosis present

## 2018-12-21 DIAGNOSIS — E118 Type 2 diabetes mellitus with unspecified complications: Secondary | ICD-10-CM | POA: Diagnosis present

## 2018-12-21 DIAGNOSIS — Z8673 Personal history of transient ischemic attack (TIA), and cerebral infarction without residual deficits: Secondary | ICD-10-CM | POA: Insufficient documentation

## 2018-12-21 DIAGNOSIS — Z5329 Procedure and treatment not carried out because of patient's decision for other reasons: Secondary | ICD-10-CM | POA: Diagnosis not present

## 2018-12-21 DIAGNOSIS — R079 Chest pain, unspecified: Secondary | ICD-10-CM

## 2018-12-21 DIAGNOSIS — I200 Unstable angina: Secondary | ICD-10-CM

## 2018-12-21 DIAGNOSIS — Z79899 Other long term (current) drug therapy: Secondary | ICD-10-CM | POA: Insufficient documentation

## 2018-12-21 LAB — CBC WITH DIFFERENTIAL/PLATELET
Abs Immature Granulocytes: 0.03 10*3/uL (ref 0.00–0.07)
Basophils Absolute: 0.1 10*3/uL (ref 0.0–0.1)
Basophils Relative: 1 %
Eosinophils Absolute: 0.3 10*3/uL (ref 0.0–0.5)
Eosinophils Relative: 3 %
HEMATOCRIT: 47.8 % (ref 39.0–52.0)
HEMOGLOBIN: 15.4 g/dL (ref 13.0–17.0)
Immature Granulocytes: 0 %
LYMPHS PCT: 29 %
Lymphs Abs: 3 10*3/uL (ref 0.7–4.0)
MCH: 28.5 pg (ref 26.0–34.0)
MCHC: 32.2 g/dL (ref 30.0–36.0)
MCV: 88.4 fL (ref 80.0–100.0)
Monocytes Absolute: 0.8 10*3/uL (ref 0.1–1.0)
Monocytes Relative: 8 %
Neutro Abs: 6.2 10*3/uL (ref 1.7–7.7)
Neutrophils Relative %: 59 %
Platelets: 213 10*3/uL (ref 150–400)
RBC: 5.41 MIL/uL (ref 4.22–5.81)
RDW: 14.6 % (ref 11.5–15.5)
WBC: 10.4 10*3/uL (ref 4.0–10.5)
nRBC: 0 % (ref 0.0–0.2)

## 2018-12-21 LAB — COMPREHENSIVE METABOLIC PANEL
ALT: 27 U/L (ref 0–44)
AST: 21 U/L (ref 15–41)
Albumin: 4.2 g/dL (ref 3.5–5.0)
Alkaline Phosphatase: 70 U/L (ref 38–126)
Anion gap: 9 (ref 5–15)
BILIRUBIN TOTAL: 0.8 mg/dL (ref 0.3–1.2)
BUN: 22 mg/dL (ref 8–23)
CO2: 26 mmol/L (ref 22–32)
Calcium: 9.7 mg/dL (ref 8.9–10.3)
Chloride: 98 mmol/L (ref 98–111)
Creatinine, Ser: 0.94 mg/dL (ref 0.61–1.24)
GFR calc Af Amer: >60 mL/min (ref >60)
GFR calc non Af Amer: >60 mL/min (ref >60)
Glucose, Bld: 178 mg/dL — ABNORMAL HIGH (ref 70–99)
Potassium: 4.9 mmol/L (ref 3.5–5.1)
Sodium: 133 mmol/L — ABNORMAL LOW (ref 135–145)
Total Protein: 8 g/dL (ref 6.5–8.1)

## 2018-12-21 LAB — GLUCOSE, CAPILLARY
Glucose-Capillary: 116 mg/dL — ABNORMAL HIGH (ref 70–99)
Glucose-Capillary: 216 mg/dL — ABNORMAL HIGH (ref 70–99)

## 2018-12-21 LAB — TROPONIN I
Troponin I: <0.03 ng/mL (ref <0.03)
Troponin I: <0.03 ng/mL (ref <0.03)

## 2018-12-21 LAB — BRAIN NATRIURETIC PEPTIDE: B Natriuretic Peptide: 55.1 pg/mL (ref 0.0–100.0)

## 2018-12-21 LAB — HEPARIN LEVEL (UNFRACTIONATED): Heparin Unfractionated: 0.22 IU/mL — ABNORMAL LOW (ref 0.30–0.70)

## 2018-12-21 MED ORDER — INSULIN ASPART 100 UNIT/ML ~~LOC~~ SOLN
0.0000 [IU] | Freq: Three times a day (TID) | SUBCUTANEOUS | Status: DC
  Administered 2018-12-22: 2 [IU] via SUBCUTANEOUS

## 2018-12-21 MED ORDER — SODIUM CHLORIDE 0.9% FLUSH
3.0000 mL | Freq: Two times a day (BID) | INTRAVENOUS | Status: DC
  Administered 2018-12-21 – 2018-12-22 (×2): 3 mL via INTRAVENOUS

## 2018-12-21 MED ORDER — HEPARIN BOLUS VIA INFUSION
4000.0000 [IU] | Freq: Once | INTRAVENOUS | Status: AC
  Administered 2018-12-21: 4000 [IU] via INTRAVENOUS

## 2018-12-21 MED ORDER — ASPIRIN 81 MG PO CHEW
81.0000 mg | CHEWABLE_TABLET | ORAL | Status: AC
  Administered 2018-12-22: 81 mg via ORAL
  Filled 2018-12-21: qty 1

## 2018-12-21 MED ORDER — NITROGLYCERIN 0.4 MG SL SUBL: 0.4000 mg | SUBLINGUAL_TABLET | SUBLINGUAL | Status: DC | PRN

## 2018-12-21 MED ORDER — SODIUM CHLORIDE 0.9 % WEIGHT BASED INFUSION
1.0000 mL/kg/h | INTRAVENOUS | Status: DC
  Administered 2018-12-22 (×2): 1 mL/kg/h via INTRAVENOUS

## 2018-12-21 MED ORDER — IRBESARTAN 150 MG PO TABS: 75.0000 mg | ORAL_TABLET | Freq: Every day | ORAL | Status: DC

## 2018-12-21 MED ORDER — TICAGRELOR 90 MG PO TABS
90.0000 mg | ORAL_TABLET | Freq: Two times a day (BID) | ORAL | Status: DC
  Administered 2018-12-21 – 2018-12-22 (×2): 90 mg via ORAL
  Filled 2018-12-21 (×2): qty 1

## 2018-12-21 MED ORDER — HEPARIN SODIUM (PORCINE) 5000 UNIT/ML IJ SOLN: 4000.0000 [IU] | Freq: Once | INTRAMUSCULAR | Status: DC

## 2018-12-21 MED ORDER — SODIUM CHLORIDE 0.9 % WEIGHT BASED INFUSION
3.0000 mL/kg/h | INTRAVENOUS | Status: AC
  Administered 2018-12-22: 3 mL/kg/h via INTRAVENOUS

## 2018-12-21 MED ORDER — AMIODARONE HCL 100 MG PO TABS
100.0000 mg | ORAL_TABLET | Freq: Every day | ORAL | Status: DC
  Administered 2018-12-21 – 2018-12-22 (×2): 100 mg via ORAL
  Filled 2018-12-21 (×2): qty 1

## 2018-12-21 MED ORDER — SODIUM CHLORIDE 0.9 % IV SOLN: 250.0000 mL | INTRAVENOUS | Status: DC | PRN

## 2018-12-21 MED ORDER — ISOSORBIDE MONONITRATE ER 30 MG PO TB24
15.0000 mg | ORAL_TABLET | Freq: Every day | ORAL | Status: DC
  Administered 2018-12-21 – 2018-12-22 (×2): 15 mg via ORAL
  Filled 2018-12-21 (×2): qty 1

## 2018-12-21 MED ORDER — ACETAMINOPHEN 650 MG RE SUPP: 650.0000 mg | Freq: Four times a day (QID) | RECTAL | Status: DC | PRN

## 2018-12-21 MED ORDER — HEPARIN (PORCINE) 25000 UT/250ML-% IV SOLN
INTRAVENOUS | Status: AC
  Filled 2018-12-21: qty 250

## 2018-12-21 MED ORDER — METOPROLOL TARTRATE 12.5 MG HALF TABLET
12.5000 mg | ORAL_TABLET | Freq: Two times a day (BID) | ORAL | Status: DC
  Administered 2018-12-21 – 2018-12-22 (×2): 12.5 mg via ORAL
  Filled 2018-12-21 (×2): qty 1

## 2018-12-21 MED ORDER — SODIUM CHLORIDE 0.9% FLUSH: 3.0000 mL | INTRAVENOUS | Status: DC | PRN

## 2018-12-21 MED ORDER — HEPARIN (PORCINE) 25000 UT/250ML-% IV SOLN
12.0000 [IU]/kg/h | INTRAVENOUS | Status: DC
  Administered 2018-12-21: 12 [IU]/kg/h via INTRAVENOUS

## 2018-12-21 MED ORDER — MORPHINE SULFATE (PF) 4 MG/ML IV SOLN
4.0000 mg | Freq: Once | INTRAVENOUS | Status: AC
  Administered 2018-12-21: 4 mg via INTRAVENOUS
  Filled 2018-12-21: qty 1

## 2018-12-21 MED ORDER — HEPARIN BOLUS VIA INFUSION
2000.0000 [IU] | Freq: Once | INTRAVENOUS | Status: AC
  Administered 2018-12-21: 2000 [IU] via INTRAVENOUS
  Filled 2018-12-21: qty 2000

## 2018-12-21 MED ORDER — ACETAMINOPHEN 325 MG PO TABS: 650.0000 mg | ORAL_TABLET | Freq: Four times a day (QID) | ORAL | Status: DC | PRN

## 2018-12-21 MED ORDER — ISOSORBIDE MONONITRATE ER 30 MG PO TB24: 15.0000 mg | ORAL_TABLET | Freq: Every day | ORAL | Status: DC

## 2018-12-21 MED ORDER — HEPARIN (PORCINE) 25000 UT/250ML-% IV SOLN
1400.0000 [IU]/h | INTRAVENOUS | Status: DC
  Administered 2018-12-21: 1250 [IU]/h via INTRAVENOUS
  Administered 2018-12-22: 1400 [IU]/h via INTRAVENOUS
  Filled 2018-12-21: qty 250

## 2018-12-21 MED ORDER — ATORVASTATIN CALCIUM 80 MG PO TABS
80.0000 mg | ORAL_TABLET | Freq: Every day | ORAL | Status: DC
  Administered 2018-12-21: 80 mg via ORAL
  Filled 2018-12-21: qty 1

## 2018-12-21 NOTE — Progress Notes (Signed)
ANTICOAGULATION CONSULT NOTE   Pharmacy Consult for Heparin Indication: chest pain/ACS  Allergies  Allergen Reactions  . Penicillins Itching and Rash    Has patient had a PCN reaction causing immediate rash, facial/tongue/throat swelling, SOB or lightheadedness with hypotension: Yes Has patient had a PCN reaction causing severe rash involving mucus membranes or skin necrosis: No Has patient had a PCN reaction that required hospitalization: No Has patient had a PCN reaction occurring within the last 10 years: No If all of the above answers are "NO", then may proceed with Cephalosporin use.  . Codeine Hives   Patient Measurements: Height: 6\' 1"  (185.4 cm) Weight: 238 lb 1.6 oz (108 kg) IBW/kg (Calculated) : 79.9 Heparin Dosing Weight: 102kg  Vital Signs: Temp: 97.7 F (36.5 C) (01/30 2235) Temp Source: Oral (01/30 2235) BP: 124/95 (01/30 2235) Pulse Rate: 69 (01/30 2235)  Labs: Recent Labs    12/19/18 1258 12/21/18 1327 12/21/18 2054  HGB 15.9 15.4  --   HCT 48.2 47.8  --   PLT 224 213  --   LABPROT 11.9  --   --   INR 0.88  --   --   HEPARINUNFRC  --   --  0.22*  CREATININE 0.80 0.94  --   TROPONINI <0.03 <0.03 <0.03    Estimated Creatinine Clearance: 101 mL/min (by C-G formula based on SCr of 0.94 mg/dL).   Medical History: Past Medical History:  Diagnosis Date  . CAD, multiple vessel    Severe native CAD involving left main, LAD, RCA, ramus intermedius and PDA -> status post CABG x2 (LIMA-LAD, SVG-L PDA)  . Coronary artery disease involving coronary bypass graft of native heart with angina pectoris (HCC) - thrombotic 95% oclusion of SVG-LPDA 04/03/2018   occluded LAD, subtotally occluded nondominant to codominant RCA, LCx & RI.  LIMA-LAD was intact. CULPRIT LESION was the SVG-dLPLA that had a high-grade proximal thrombotic stenosis.  S/P successful PCI of SVG-LPDA stented his vein graft with a 3.5 mm drug-eluting stent using distal protection.  [name] Kitchen Hx of CABG   .  Hyperlipidemia associated with type 2 diabetes mellitus (HCC)   . Hypertension   . Tobacco abuse   . Type II diabetes mellitus with complication (HCC)    History of stroke and CAD-CABG    Assessment: 30 YOM presenting with CP with ST dep and pharmacy consulted to start heparin.  No anticoagulation PTA, on Brilinta, CBC wnl.  1/30 PM update: heparin level below goal  Goal of Therapy:  Heparin level 0.3-0.7 units/ml Monitor platelets by anticoagulation protocol: Yes   Plan:  Heparin 2000 units re-bolus, then inc drip to 1400 units/hr Re-check heparin level with AM labs  Abran Duke, PharmD, BCPS Clinical Pharmacist Phone: [phone]

## 2018-12-21 NOTE — ED Notes (Signed)
ED Provider at bedside. 

## 2018-12-21 NOTE — ED Notes (Signed)
Report given to Windell Moulding, RN on [facility]

## 2018-12-21 NOTE — ED Notes (Signed)
Pt states he does not need nitro, does not want to take it. EDP notified.

## 2018-12-21 NOTE — Consult Note (Addendum)
Cardiology Consultation:   Patient ID: Matthew Moses MRN: [id number]; DOB: 16-Nov-1951  Admit date: 12/21/2018 Date of Consult: 12/21/2018  Primary Care Provider: Patient, No Pcp Per Primary Cardiologist: Nanetta Batty, MD  Primary Electrophysiologist:  None    Patient Profile:   Matthew Moses is a 68 y.o. male with a hx of CAD CABG 2012, s/p posterior STEMI 04/02/2018, hypertension, hyperlipidemia, and diabetes type 2 who is being seen today for the evaluation of chest pain at the request of Dr. David Stall.  History of Present Illness:   Matthew Moses has a history of CABG at [facility] in 2012.  He had a posterior STEMI complicated by pulseless V. tach and 04/02/2018.  Urgent cardiac cath showed a thrombus in the saphenous vein graft to first obtuse marginal.  The thrombus was aspirated and a stent was successfully placed.  He also had diffusely diseased RCA.  LV gram noted severely reduced LVEF.  Follow-up echo showed EF 40-45% with wall motion abnormalities.  Patient was placed on aspirin and Brilinta to be continued for at least 1 year.  He was placed on amiodarone for treatment of V. Tach.   Matthew Moses was last seen in the office on 07/19/2018 by Dr. Allyson Sabal at which time he was asymptomatic.  He remains on Brilinta.  He is on valsartan for cardiomyopathy.  Not on a beta-blocker due to baseline low heart rate.  He remains on amiodarone with plan to ultimately transition to carvedilol in the future.  He is on high intensity statin.  He was noted to be continuing to smoke 1 pack/day.  Matthew Moses says that he has had twinges in his chest for the last 6 months.  For the last 3 months he has had a constant mild chest pressure with intermittent worse chest pressure associated with shortness of breath and dizziness.  This is not related to activity.  He also has left chest pain when he lies on his left side that is better when he turns over to his right side.  He is still having a mild  low level chest pressure.  He has not tried any nitroglycerin.  Matthew Moses was smoking 1/2 pack/day but says he had his last cigarette on Tuesday and plans on complete cessation.  He tells me that he is only currently taking 3 medications which include amiodarone, Brilinta, and metformin.  He is no longer taking a statin or Jardiance or valsartan.  Past Medical History:  Diagnosis Date  . CAD, multiple vessel    Severe native CAD involving left main, LAD, RCA, ramus intermedius and PDA -> status post CABG x2 (LIMA-LAD, SVG-L PDA)  . Coronary artery disease involving coronary bypass graft of native heart with angina pectoris (HCC) - thrombotic 95% oclusion of SVG-LPDA 04/03/2018   occluded LAD, subtotally occluded nondominant to codominant RCA, LCx & RI.  LIMA-LAD was intact. CULPRIT LESION was the SVG-dLPLA that had a high-grade proximal thrombotic stenosis.  S/P successful PCI of SVG-LPDA stented his vein graft with a 3.5 mm drug-eluting stent using distal protection.  [name] Kitchen Hx of CABG   . Hyperlipidemia associated with type 2 diabetes mellitus (HCC)   . Hypertension   . Tobacco abuse   . Type II diabetes mellitus with complication (HCC)    History of stroke and CAD-CABG    Past Surgical History:  Procedure Laterality Date  . CORONARY ARTERY BYPASS GRAFT    . CORONARY/GRAFT ACUTE MI REVASCULARIZATION N/A 04/02/2018   Procedure: Coronary/Graft  Acute MI Revascularization;  Surgeon: Runell GessBerry, Jonathan J, MD;  [facility];  Service: Cardiovascular: Thrombotic 95% SVG-L NFA:OZHYPDA:PTCA - thrombectomy -> DES PCI Synergy DES 3.5 x 20 (3.9 mm)  . KNEE SURGERY    . LEFT HEART CATH AND CORS/GRAFTS ANGIOGRAPHY N/A 04/02/2018   Procedure: LEFT HEART CATH AND CORS/GRAFTS ANGIOGRAPHY;  Surgeon: Runell GessBerry, Jonathan J, MD;  Location: [facility] INVASIVE CV LAB;  Service: Cardiovascular: Ostial Left Main 50%; distal Left Main-pLAD 100% - d(apical)LAD 95 (after patent LIMA-LAD).OstRI - 99%; ost-mLCx 95%, dCx 95% & OM2  95%; ost-pRCA (co-dominant) 99%mRCA 80%; SVG-LPDA 99% ostial thrombosis; EF 30% with a EDP 31 mmHg  . MUSCLE REPAIR    . TONSILLECTOMY       Home Medications:  Prior to Admission medications   Medication Sig Start Date End Date Taking? Authorizing Provider  amiodarone (PACERONE) 100 MG tablet Take 100 mg by mouth daily.    [provider]  atorvastatin (LIPITOR) 80 MG tablet Take 1 tablet (80 mg total) by mouth daily at 6 PM. 04/05/18   Arty Baumgartneroberts, Lindsay B, NP  empagliflozin (JARDIANCE) 10 MG TABS tablet Take 10 mg by mouth daily. 04/05/18   Arty Baumgartneroberts, Lindsay B, NP  Lancets [facility](ONETOUCH ULTRASOFT) lancets Use as instructed 07/05/16   Layne BentonBiby, Sharon L, NP  metFORMIN (GLUCOPHAGE) 500 MG tablet Take 1 tablet (500 mg total) by mouth 2 (two) times daily with a meal. 04/11/18   Kilroy, Eda PaschalLuke K, PA-C  nitroGLYCERIN (NITROSTAT) 0.4 MG SL tablet Place 1 tablet (0.4 mg total) under the tongue every 5 (five) minutes as needed. 04/05/18   Arty Baumgartneroberts, Lindsay B, NP  ticagrelor (BRILINTA) 90 MG TABS tablet Take 1 tablet (90 mg total) by mouth 2 (two) times daily. 06/06/18   Runell GessBerry, Jonathan J, MD  valsartan (DIOVAN) 40 MG tablet Take 1 tablet (40 mg total) by mouth daily. 04/05/18 07/04/18  Arty Baumgartneroberts, Lindsay B, NP    Inpatient Medications: Scheduled Meds:  Continuous Infusions: . heparin    . heparin 1,250 Units/hr (12/21/18 1607)   PRN Meds: nitroGLYCERIN  Allergies:    Allergies  Allergen Reactions  . Codeine Hives  . Penicillins Itching and Rash    Has patient had a PCN reaction causing immediate rash, facial/tongue/throat swelling, SOB or lightheadedness with hypotension: Yes Has patient had a PCN reaction causing severe rash involving mucus membranes or skin necrosis: No Has patient had a PCN reaction that required hospitalization: No Has patient had a PCN reaction occurring within the last 10 years: No If all of the above answers are "NO", then may proceed with Cephalosporin use.     Social  History:   Social History   Socioeconomic History  . Marital status: Single    Spouse name: Not on file  . Number of children: Not on file  . Years of education: Not on file  . Highest education level: Not on file  Occupational History  . Not on file  Social Needs  . Financial resource strain: Not on file  . Food insecurity:    Worry: Not on file    Inability: Not on file  . Transportation needs:    Medical: Not on file    Non-medical: Not on file  Tobacco Use  . Smoking status: Current Every Day Smoker  . Smokeless tobacco: Never Used  Substance and Sexual Activity  . Alcohol use: No  . Drug use: Yes    Types: Marijuana  . Sexual activity: Not on file  Lifestyle  .  Physical activity:    Days per week: Not on file    Minutes per session: Not on file  . Stress: Not on file  Relationships  . Social connections:    Talks on phone: Not on file    Gets together: Not on file    Attends religious service: Not on file    Active member of club or organization: Not on file    Attends meetings of clubs or organizations: Not on file    Relationship status: Not on file  . Intimate partner violence:    Fear of current or ex partner: Not on file    Emotionally abused: Not on file    Physically abused: Not on file    Forced sexual activity: Not on file  Other Topics Concern  . Not on file  Social History Narrative  . Not on file    Family History:    Family History  Problem Relation Age of Onset  . CAD Mother        CABG 71's, died 66's     ROS:  Please see the history of present illness.   All other ROS reviewed and negative.     Physical Exam/Data:   Vitals:   12/21/18 1537 12/21/18 1630 12/21/18 1700 12/21/18 1844  BP: (!) 145/97 (!) 132/117 (!) 141/80 120/79  Pulse: 73 74 77 65  Resp: Temp: 97.8 F (36.6 C)   97.7 F (36.5 C)  TempSrc: Oral   Oral  SpO2: 100% 98% 100% 97%  Weight:      Height:        Intake/Output Summary (Last 24 hours)  at 12/21/2018 1921 Last data filed at 12/21/2018 1608 Gross per 24 hour  Intake 0 ml  Output -  Net 0 ml   Last 3 Weights 12/21/2018 12/19/2018 07/19/2018  Weight (lbs) 238 lb 1.6 oz 240 lb 241 lb 9.6 oz  Weight (kg) 108 kg 108.863 kg 109.589 kg     Body mass index is 31.41 kg/m.  General:  Well nourished, well developed, in no acute distress HEENT: normal Lymph: no adenopathy Neck: no JVD Endocrine:  No thryomegaly Vascular: No carotid bruits; FA pulses 2+ bilaterally without bruits  Cardiac:  normal S1, S2; RRR; no murmur  Lungs:  clear to auscultation bilaterally, no wheezing, rhonchi or rales  Abd: soft, nontender, no hepatomegaly  Ext: no edema Musculoskeletal:  No deformities, BUE and BLE strength normal and equal Skin: warm and dry  Neuro:  CNs 2-12 intact, no focal abnormalities noted Psych:  Normal affect   EKG:  The EKG was personally reviewed and demonstrates:  Sinus rhythm with Ventricular premature complex, Left axis deviation, Minimal ST depression, inferior leads Telemetry:  Telemetry was personally reviewed and demonstrates: Sinus rhythm in the 80s  Relevant CV Studies:  Cath: 04/02/18 Conclusion    Ost LM to Mid LM lesion is 50% stenosed.  Dist LM to Prox LAD lesion is 100% stenosed.  Ost Ramus to Ramus lesion is 99% stenosed.  Ost RCA to Prox RCA lesion is 99% stenosed.  Mid RCA lesion is 80% stenosed.  Ost Cx to Mid Cx lesion is 95% stenosed.  Ost 2nd Mrg to 2nd Mrg lesion is 95% stenosed.  Dist Cx lesion is 95% stenosed.  Dist LAD lesion is 95% stenosed.  Prox Graft lesion is 99% stenosed.  A stent was successfully placed.  Post intervention, there is a 0% residual stenosis.  There is severe  left ventricular systolic dysfunction.  LV end diastolic pressure is severely elevated.    IMPRESSION:Matthew Moses has severe native vessel disease with an occluded LAD, subtotally occluded nondominant to codominant RCA, circumflex and ramus branch.  His culprit vessel was the vein graft to a distal PLA that had a high-grade proximal thrombotic stenosis. LIMA was intact. I successfully stented his vein graft with a 3.5 mm drug-eluting stent using distal protection. There is no distal embolization. Angiomax will continue for 4 hours and Aggrastat for 18 hours. Patient will need dual antiplatelet therapy uninterrupted for at least 12 months. 2D echo is pending. He may need a LifeVest prior to discharge depending on his ejection fraction. He will need aggressive risk factor modification including high-dose statin therapy, dual antiplatelet therapy, beta blockade and smoking cessation. He will also need diabetic education. He left the lab in stable condition.  Nanetta Batty. MD, [facility]  TTE: 04/02/18 Study Conclusions - Left ventricle: The cavity size was normal. There was mild concentric hypertrophy. Systolic function was mildly to moderately reduced. The estimated ejection fraction was in the range of 40% to 45%. There is hypokinesis in the basal and mid inferoseptal , inferior and inferolateral walls. Features are consistent with a pseudonormal left ventricular filling pattern, with concomitant abnormal relaxation and increased filling pressure (grade 2 diastolic dysfunction). Doppler parameters are consistent with elevated ventricular end-diastolic filling pressure. - Aortic valve: There was no regurgitation. - Mitral valve: There was mild regurgitation. - Left atrium: The atrium was moderately dilated. - Right ventricle: The cavity size was normal. Wall thickness was normal. Systolic function was normal. - Right atrium: The atrium was normal in size. - Tricuspid valve: There was trivial regurgitation. - Pulmonary arteries: Systolic pressure was within the normal range. - Inferior vena cava: The vessel was normal in size. - Pericardium, extracardiac: There was no pericardial effusion.  Impressions: -  There is hypokinesis in the basal and mid inferoseptal , inferior and inferolateral walls. LVEF 40-45% , previously 60-65% on the echopcardiogram from 07/04/2016. _____________   Laboratory Data:  Chemistry Recent Labs  Lab 12/19/18 1258 12/21/18 1327  NA 135 133*  K 4.0 4.9  CL 99 98  CO2 25 26  GLUCOSE 148* 178*  BUN 24* 22  CREATININE 0.80 0.94  CALCIUM 9.5 9.7  GFRNONAA >60 >60  GFRAA >60 >60  ANIONGAP 11 9    Recent Labs  Lab 12/19/18 1258 12/21/18 1327  PROT 8.0 8.0  ALBUMIN 4.2 4.2  AST 26 21  ALT 30 27  ALKPHOS 68 70  BILITOT 0.8 0.8   Hematology Recent Labs  Lab 12/19/18 1258 12/21/18 1327  WBC 10.9* 10.4  RBC 5.56 5.41  HGB 15.9 15.4  HCT 48.2 47.8  MCV 86.7 88.4  MCH 28.6 28.5  MCHC 33.0 32.2  RDW 14.6 14.6  PLT 224 213   Cardiac Enzymes Recent Labs  Lab 12/19/18 1258 12/21/18 1327  TROPONINI <0.03 <0.03   No results for input(s): TROPIPOC in the last 168 hours.  BNP Recent Labs  Lab 12/21/18 1327  BNP 55.1    DDimer No results for input(s): DDIMER in the last 168 hours.  Radiology/Studies:  Dg Chest 2 View  Result Date: 12/21/2018 CLINICAL DATA:  Chest pain.  Shortness of breath. EXAM: CHEST - 2 VIEW COMPARISON:  One-view chest x-ray 12/19/2018 FINDINGS: Heart size is normal. There is no edema or effusion. No focal airspace disease is present. The patient is status post median sternotomy for CABG. Aortic  atherosclerosis is present. IMPRESSION: 1. No acute cardiopulmonary disease or significant interval change. 2. Aortic atherosclerosis. Electronically Signed   By: Marin Roberts M.D.   On: 12/21/2018 13:40   Dg Chest Port 1 View  Result Date: 12/19/2018 CLINICAL DATA:  Chest pain EXAM: PORTABLE CHEST 1 VIEW COMPARISON:  04/02/2018 FINDINGS: There is no focal parenchymal opacity. There is no pleural effusion or pneumothorax. The heart and mediastinal contours are unremarkable. There is evidence of prior CABG. The osseous  structures are unremarkable. IMPRESSION: No active disease. Electronically Signed   By: Elige Ko   On: 12/19/2018 13:24    Assessment and Plan:   1. Chest pain -Patient with known CAD status post CABG 2012 and STEMI 03/2018 -Presents with both typical and atypical symptoms -EKG shows accentuated ST depression that could possibly indicate ischemia. -First troponin negative, trend troponins -Normal renal function and potassium. -IV heparin is infusing -We will plan for cardiac catheterization tomorrow to look at grafts -Blood pressure has been normal to mildly elevated.  Since he has room and blood pressure we will add low-dose beta-blocker and low-dose Imdur.  2.  CAD -Status post CABG 2012 and STEMI 03/2018 with thrombus removed from saphenous vein graft to first obtuse marginal and a stent was successfully placed.  He also had diffusely diseased RCA. -He continues on Brilinta and aspirin, statin, no beta-blocker due to baseline low heart rate.  He is on amiodarone due to V. tach with his MI.  3.  Ischemic cardiomyopathy -Post MI EF 40-45% -Patient was ordered an ARB per previous notes, but he says he is not taking any blood pressure medicine.  Will add low-dose beta-blocker.  4.  Hypertension -As above patient has not been taking any antihypertensives. -Blood pressure is stable to mildly elevated.  As above we will add low-dose beta-blocker and Imdur.  5.  Hyperlipidemia -atorvastatin 80 mg was initiated after his MI however the patient says that he is no longer taking any cholesterol medications.  LDL was 93 in 03/2018.  Will update lipid panel. -We will resume atorvastatin 80 mg  6.  Diabetes type 2 -On metformin.  [place] Pepper was previously initiated however patient says he is no longer taking it and he is not sure why. -Hemoglobin A1c was 7.7 in 03/2018 -We will need to hold metformin for 48 hours post cath  7.  Tobacco abuse -Patient has been 1/2 pack/day smoker.  He says he  has not smoked since Tuesday and has plans for complete cessation.  Patient strongly encouraged to follow through.  For questions or updates, please contact [facility] Please consult [email or web address] for contact info under   Signed, Berton Bon, NP  12/21/2018 7:21 PM  Personally seen and examined. Agree with above.   Chest pain both typical and atypical (vice like).  ECG with ST depressions that are accentuated. Cath 5.2019 EF 45% CAD/USA/prior SVG stent.   GEN: Well nourished, well developed, in no acute distress  HEENT: normal  Neck: no JVD, carotid bruits, or masses Cardiac: RRR; no murmurs, rubs, or gallops,no edema  Respiratory:  clear to auscultation bilaterally, normal work of breathing GI: soft, nontender, nondistended, + BS MS: no deformity or atrophy  Skin: warm and dry, no rash Neuro:  Alert and Oriented x 3, Strength and sensation are intact Psych: euthymic mood, full affect   - Will plan on cath to evaluate anatomy.  - Start Bb, Imdur.   - Continue amio, prior VT  Unfortunately, many of symptoms  may be unrelated to cardiac condition.   Donato SchultzMark Skains, MD

## 2018-12-21 NOTE — ED Triage Notes (Signed)
Pt c/o Sob, CP  Which radiates in to neck  x 5 days , seen here tues for same refused admission

## 2018-12-21 NOTE — Progress Notes (Signed)
Pt admitted to the unit from [facility] via carelink. Pt A&O x4; telemetry applied and verified; IV intact with heparin gtt infusing and rate verified; skin intact with no opened wounds or pressure ulcers noted. Pt oriented to the unit and room; fall/safety precaution and prevention education completed. MD notified of pt's arrival to the unit; call light within reach and will continue to closely monitor pt. Dionne Bucy RN   12/21/18 1844  Vitals  Temp 97.7 F (36.5 C)  Temp Source Oral  BP 120/79  MAP (mmHg) 93  BP Location Right Arm  BP Method Automatic  Patient Position (if appropriate) Lying  Pulse Rate 65  Pulse Rate Source Dinamap  Resp 18  Oxygen Therapy  SpO2 97 %  O2 Device Room Air  MEWS Score  MEWS RR 0  MEWS Pulse 0  MEWS Systolic 0  MEWS LOC 0  MEWS Temp 0  MEWS Score 0  MEWS Score Color Green

## 2018-12-21 NOTE — H&P (Addendum)
History and Physical    Matthew Moses LXB:[id number] DOB: June 30, 1951 DOA: 12/21/2018  PCP: Patient, No Pcp Per  Chief Complaint: Chest pain  HPI: Matthew Moses is a 68 y.o. male with medical history significant of hypertension, hyperlipidemia, type 2 diabetes, tobacco use, CVA, CAD status post CABG in 2012, status post posterior STEMI in May 2019 treated with PCI complicated by pulseless V. tach presenting as a transfer from [facility] ED for evaluation of chest pain.  Patient reports having constant daily chest pain at rest since his MI in May 2019.  States for the past few weeks the chest pain has been getting progressively worse.  He describes the pain as substernal, dull/pressure-like and each episode lasts up to 24 hours.  Chest pain is associated with shortness of breath.  No associated diaphoresis or nausea. He continues to have 4 out of 10 intensity chest pain at present.  Review of Systems: As per HPI otherwise 10 point review of systems negative.  Past Medical History:  Diagnosis Date  . CAD, multiple vessel    Severe native CAD involving left main, LAD, RCA, ramus intermedius and PDA -> status post CABG x2 (LIMA-LAD, SVG-L PDA)  . Coronary artery disease involving coronary bypass graft of native heart with angina pectoris (HCC) - thrombotic 95% oclusion of SVG-LPDA 04/03/2018   occluded LAD, subtotally occluded nondominant to codominant RCA, LCx & RI.  LIMA-LAD was intact. CULPRIT LESION was the SVG-dLPLA that had a high-grade proximal thrombotic stenosis.  S/P successful PCI of SVG-LPDA stented his vein graft with a 3.5 mm drug-eluting stent using distal protection.  [name] Kitchen Hx of CABG   . Hyperlipidemia associated with type 2 diabetes mellitus (HCC)   . Hypertension   . Tobacco abuse   . Type II diabetes mellitus with complication (HCC)    History of stroke and CAD-CABG    Past Surgical History:  Procedure Laterality Date  . CORONARY ARTERY BYPASS GRAFT    .  CORONARY/GRAFT ACUTE MI REVASCULARIZATION N/A 04/02/2018   Procedure: Coronary/Graft Acute MI Revascularization;  Surgeon: [name], MD;  Location: [facility] INVASIVE CV LAB;  Service: Cardiovascular: Thrombotic 95% SVG-L CBU:LAGT - thrombectomy -> DES PCI Synergy DES 3.5 x 20 (3.9 mm)  . KNEE SURGERY    . LEFT HEART CATH AND CORS/GRAFTS ANGIOGRAPHY N/A 04/02/2018   Procedure: LEFT HEART CATH AND CORS/GRAFTS ANGIOGRAPHY;  Surgeon: [name], MD;  Location: [facility] INVASIVE CV LAB;  Service: Cardiovascular: Ostial Left Main 50%; distal Left Main-pLAD 100% - d(apical)LAD 95 (after patent LIMA-LAD).OstRI - 99%; ost-mLCx 95%, dCx 95% & OM2 95%; ost-pRCA (co-dominant) 99%mRCA 80%; SVG-LPDA 99% ostial thrombosis; EF 30% with a EDP 31 mmHg  . MUSCLE REPAIR    . TONSILLECTOMY       reports that he has been smoking. He has never used smokeless tobacco. He reports current drug use. Drug: Marijuana. He reports that he does not drink alcohol.  Allergies  Allergen Reactions  . Penicillins Itching and Rash    Has patient had a PCN reaction causing immediate rash, facial/tongue/throat swelling, SOB or lightheadedness with hypotension: Yes Has patient had a PCN reaction causing severe rash involving mucus membranes or skin necrosis: No Has patient had a PCN reaction that required hospitalization: No Has patient had a PCN reaction occurring within the last 10 years: No If all of the above answers are "NO", then may proceed with Cephalosporin use.  . Codeine Hives    Family History  Problem  Relation Age of Onset  . CAD Mother        CABG 50's, died 79's    Prior to Admission medications   Medication Sig Start Date End Date Taking? Authorizing Provider  amiodarone (PACERONE) 100 MG tablet Take 100 mg by mouth daily.    [provider]  atorvastatin (LIPITOR) 80 MG tablet Take 1 tablet (80 mg total) by mouth daily at 6 PM. 04/05/18   Arty Baumgartner, NP  empagliflozin (JARDIANCE) 10 MG TABS  tablet Take 10 mg by mouth daily. 04/05/18   Arty Baumgartner, NP  Lancets [facility] ULTRASOFT) lancets Use as instructed 07/05/16   Layne Benton, NP  metFORMIN (GLUCOPHAGE) 500 MG tablet Take 1 tablet (500 mg total) by mouth 2 (two) times daily with a meal. 04/11/18   Kilroy, Eda Paschal, PA-C  nitroGLYCERIN (NITROSTAT) 0.4 MG SL tablet Place 1 tablet (0.4 mg total) under the tongue every 5 (five) minutes as needed. 04/05/18   Arty Baumgartner, NP  ticagrelor (BRILINTA) 90 MG TABS tablet Take 1 tablet (90 mg total) by mouth 2 (two) times daily. 06/06/18   [name], MD  valsartan (DIOVAN) 40 MG tablet Take 1 tablet (40 mg total) by mouth daily. 04/05/18 07/04/18  Arty Baumgartner, NP    Physical Exam: Vitals:   12/21/18 1537 12/21/18 1630 12/21/18 1700 12/21/18 1844  BP: (!) 145/97 (!) 132/117 (!) 141/80 120/79  Pulse: 73 74 77 65  Resp: Temp: 97.8 F (36.6 C)   97.7 F (36.5 C)  TempSrc: Oral   Oral  SpO2: 100% 98% 100% 97%  Weight:      Height:        Physical Exam  Constitutional: He is oriented to person, place, and time. He appears well-developed and well-nourished. No distress.  Sitting up at the side of the bed eating dinner.  No acute distress.  HENT:  Head: Normocephalic.  Mouth/Throat: Oropharynx is clear and moist.  Eyes: Right eye exhibits no discharge. Left eye exhibits no discharge.  Neck: Neck supple.  Cardiovascular: Normal rate, regular rhythm and intact distal pulses.  Pulmonary/Chest: Effort normal and breath sounds normal. No respiratory distress. He has no wheezes. He has no rales.  Abdominal: Soft. Bowel sounds are normal. He exhibits no distension. There is no abdominal tenderness. There is no guarding.  Musculoskeletal:        General: No edema.  Neurological: He is alert and oriented to person, place, and time.  Skin: Skin is warm and dry. He is not diaphoretic.     Labs on Admission: I have personally reviewed following labs and  imaging studies  CBC: Recent Labs  Lab 12/19/18 1258 12/21/18 1327  WBC 10.9* 10.4  NEUTROABS  --  6.2  HGB 15.9 15.4  HCT 48.2 47.8  MCV 86.7 88.4  PLT 224 213   Basic Metabolic Panel: Recent Labs  Lab 12/19/18 1258 12/21/18 1327  NA 135 133*  K 4.0 4.9  CL 99 98  CO2 25 26  GLUCOSE 148* 178*  BUN 24* 22  CREATININE 0.80 0.94  CALCIUM 9.5 9.7   GFR: Estimated Creatinine Clearance: 101 mL/min (by C-G formula based on SCr of 0.94 mg/dL). Liver Function Tests: Recent Labs  Lab 12/19/18 1258 12/21/18 1327  AST 26 21  ALT 30 27  ALKPHOS 68 70  BILITOT 0.8 0.8  PROT 8.0 8.0  ALBUMIN 4.2 4.2   No results for input(s): LIPASE,  AMYLASE in the last 168 hours. No results for input(s): AMMONIA in the last 168 hours. Coagulation Profile: Recent Labs  Lab 12/19/18 1258  INR 0.88   Cardiac Enzymes: Recent Labs  Lab 12/19/18 1258 12/21/18 1327  TROPONINI <0.03 <0.03   BNP (last 3 results) No results for input(s): PROBNP in the last 8760 hours. HbA1C: No results for input(s): HGBA1C in the last 72 hours. CBG: Recent Labs  Lab 12/21/18 1903  GLUCAP 116*   Lipid Profile: No results for input(s): CHOL, HDL, LDLCALC, TRIG, CHOLHDL, LDLDIRECT in the last 72 hours. Thyroid Function Tests: No results for input(s): TSH, T4TOTAL, FREET4, T3FREE, THYROIDAB in the last 72 hours. Anemia Panel: No results for input(s): VITAMINB12, FOLATE, FERRITIN, TIBC, IRON, RETICCTPCT in the last 72 hours. Urine analysis:    Component Value Date/Time   COLORURINE YELLOW 07/02/2016 1645   APPEARANCEUR CLEAR 07/02/2016 1645   LABSPEC 1.016 07/02/2016 1645   PHURINE 7.0 07/02/2016 1645   GLUCOSEU >1000 (A) 07/02/2016 1645   HGBUR NEGATIVE 07/02/2016 1645   BILIRUBINUR NEGATIVE 07/02/2016 1645   KETONESUR NEGATIVE 07/02/2016 1645   PROTEINUR NEGATIVE 07/02/2016 1645   NITRITE NEGATIVE 07/02/2016 1645   LEUKOCYTESUR SMALL (A) 07/02/2016 1645    Radiological Exams on  Admission: Dg Chest 2 View  Result Date: 12/21/2018 CLINICAL DATA:  Chest pain.  Shortness of breath. EXAM: CHEST - 2 VIEW COMPARISON:  One-view chest x-ray 12/19/2018 FINDINGS: Heart size is normal. There is no edema or effusion. No focal airspace disease is present. The patient is status post median sternotomy for CABG. Aortic atherosclerosis is present. IMPRESSION: 1. No acute cardiopulmonary disease or significant interval change. 2. Aortic atherosclerosis. Electronically Signed   By: Marin Robertshristopher  Mattern M.D.   On: 12/21/2018 13:40    EKG: Independently reviewed.  New ST depressions in leads II, aVF, V4, V5.  Assessment/Plan Principal Problem:   Unstable angina [facility](HCC) Active Problems:   Essential hypertension   Diabetes mellitus type 2, controlled, with complications (HCC)   Hyperlipidemia LDL goal <70   Ischemic cardiomyopathy -   Unstable angina -Patient with known history of coronary artery disease status post CABG in 2012 and STEMI in May 2019 -Presenting with both typical and atypical symptoms -EKG showing new ST depressions.  First troponin negative. -On heparin drip -Patient was seen by cardiology and plan is cardiac catheterization tomorrow to look at grafts. -Continue to trend troponin -Cardiology started low-dose beta-blocker and low-dose Imdur -Continue amiodarone due to history of V. tach with his MI -Continue aspirin, Brilinta, statin -Sublingual nitroglycerin as needed   Ischemic cardiomyopathy -Post MI EF 40 to 45% -Cardiology added low-dose beta-blocker  Hypertension -Cardiology added low-dose beta-blocker and Imdur  Hyperlipidemia -Continue high intensity statin.  LDL 93 in May 2019. -Check lipid panel  Type 2 diabetes -A1c 7.7 in May 2019. -Recheck A1c -Sliding scale insulin sensitive -CBG checks -Hold home metformin  DVT prophylaxis: Heparin Code Status: Patient wishes to be DNR. Family Communication: No family available. Disposition Plan:  Anticipate discharge after clinical improvement. Consults called: Cardiology Admission status: Observation, telemetry   John GiovanniVasundhra Shay Bartoli MD [facility] Pager [phone]  If 7PM-7AM, please contact night-coverage [email or web address] Password [email or web address]  12/21/2018, 9:19 PM

## 2018-12-21 NOTE — Progress Notes (Signed)
ANTICOAGULATION CONSULT NOTE - Initial Consult  Pharmacy Consult for heparin Indication: chest pain/ACS  Allergies  Allergen Reactions  . Codeine Hives  . Penicillins Itching and Rash    Has patient had a PCN reaction causing immediate rash, facial/tongue/throat swelling, SOB or lightheadedness with hypotension: Yes Has patient had a PCN reaction causing severe rash involving mucus membranes or skin necrosis: No Has patient had a PCN reaction that required hospitalization: No Has patient had a PCN reaction occurring within the last 10 years: No If all of the above answers are "NO", then may proceed with Cephalosporin use.     Patient Measurements: Height: 6\' 1"  (185.4 cm) Weight: 238 lb 1.6 oz (108 kg) IBW/kg (Calculated) : 79.9 Heparin Dosing Weight: 102kg  Vital Signs: Temp: 97.8 F (36.6 C) (01/30 1537) Temp Source: Oral (01/30 1537) BP: 145/97 (01/30 1537) Pulse Rate: 73 (01/30 1537)  Labs: Recent Labs    12/19/18 1258 12/21/18 1327  HGB 15.9 15.4  HCT 48.2 47.8  PLT 224 213  LABPROT 11.9  --   INR 0.88  --   CREATININE 0.80 0.94  TROPONINI <0.03 <0.03    Estimated Creatinine Clearance: 101 mL/min (by C-G formula based on SCr of 0.94 mg/dL).   Medical History: Past Medical History:  Diagnosis Date  . CAD, multiple vessel    Severe native CAD involving left main, LAD, RCA, ramus intermedius and PDA -> status post CABG x2 (LIMA-LAD, SVG-L PDA)  . Coronary artery disease involving coronary bypass graft of native heart with angina pectoris (HCC) - thrombotic 95% oclusion of SVG-LPDA 04/03/2018   occluded LAD, subtotally occluded nondominant to codominant RCA, LCx & RI.  LIMA-LAD was intact. CULPRIT LESION was the SVG-dLPLA that had a high-grade proximal thrombotic stenosis.  S/P successful PCI of SVG-LPDA stented his vein graft with a 3.5 mm drug-eluting stent using distal protection.  [name] Kitchen Hx of CABG   . Hyperlipidemia associated with type 2 diabetes mellitus (HCC)    . Hypertension   . Tobacco abuse   . Type II diabetes mellitus with complication (HCC)    History of stroke and CAD-CABG    Assessment: 40 YOM presenting with CP with ST dep and pharmacy consulted to start heparin.  No anticoagulation PTA, on Brilinta, CBC wnl. Goal of Therapy:  Heparin level 0.3-0.7 units/ml Monitor platelets by anticoagulation protocol: Yes   Plan:  Heparin 4000 units x 1 IV, and gtt at 1250 units/hr F/u 6 hour heparin level Daily heparin level, CBC, s/s bleeding  Daylene Posey, PharmD Clinical Pharmacist Please check AMION for all [facility] numbers 12/21/2018 4:10 PM

## 2018-12-21 NOTE — Progress Notes (Signed)
BP (!) 145/97 (BP Location: Right Arm)   Pulse 73   Temp 97.8 F (36.6 C) (Oral)   Resp 18   Ht 6\' 1"  (1.854 m)   Wt 108 kg   SpO2 100%   BMI 31.41 kg/m     68 year old overweight with past medical history of hyperlipidemia, diabetes mellitus, a 50-year pack of smoking stroke back in August 2017, bypass grafting in 2012 he presented in May 2019 for VT V. fib, during that time catheterization was done that revealed CAD for which he received a drug-eluting stent, recently seen in the emergency room the day prior to this admission for chest pain for which she refused admission, went home felt better but on the day of admission started developing exertional chest pain better with rest with diaphoresis and nausea improved with rest.  In the ED at med [facility] [place] EKG showed ST segment depression, cardiac biomarkers were negative he was started on IV heparin, not a beta-blocker as his pulse rate is 60 and he is on amiodarone, on a statin and Brilinta, cardiology was consulted who recommended admission to medicine and they will see in consult.

## 2018-12-21 NOTE — ED Provider Notes (Signed)
[facility] EMERGENCY DEPARTMENT Provider Note   CSN: [id number] Arrival date & time: 12/21/18  1305     History   Chief Complaint Chief Complaint  Patient presents with  . Chest Pain    HPI Chandra Feger is a 68 y.o. male with PMH/o CAD, CABG, HLD, DM, HTN who presents for evaluation of chest pain. He was seen here on 12/19/18 for evaluation of similar pain. At that time, it was recommended that he was admitted, but patient declined.  He reports that when he went home, he had some improvement of his pain and states that yesterday, he was feeling better.  He reports that today, he was walking out of [employer] when he felt the chest pain again.  He states that the pain radiates across his entire chest and states that it "feels like a donkey sitting on his chest."  Patient states that the pain radiates up into his neck.  Patient states that he felt like he was stop walking around the store because of the pain and was worried that he was not able to walk to his car because the pain was so severe.  He states that the pain in his chest has been constant since onset but states that the pain in his neck comes and goes.  Patient also reports that he has had shortness of breath over the last several days.  He states that he feels like he gets tired, shortness of breath with any type of activity.  He states he did not get any associated diaphoresis, nausea when the pain first began but states that he felt slightly lightheaded.  Patient states that about a week ago, he had some fever, chills, vomiting, diarrhea but states that has resolved.  Patient does report he has had some mild swelling in his bilateral lower extremities.  He states that the pain is worse when laying down flat and he has had to lay on his on his sides to help with the pain.  Patient denies any abdominal pain, nausea/vomiting  The history is provided by the patient.    Past Medical History:  Diagnosis Date  . CAD, multiple  vessel    Severe native CAD involving left main, LAD, RCA, ramus intermedius and PDA -> status post CABG x2 (LIMA-LAD, SVG-L PDA)  . Coronary artery disease involving coronary bypass graft of native heart with angina pectoris (HCC) - thrombotic 95% oclusion of SVG-LPDA 04/03/2018   occluded LAD, subtotally occluded nondominant to codominant RCA, LCx & RI.  LIMA-LAD was intact. CULPRIT LESION was the SVG-dLPLA that had a high-grade proximal thrombotic stenosis.  S/P successful PCI of SVG-LPDA stented his vein graft with a 3.5 mm drug-eluting stent using distal protection.  [name] Kitchen Hx of CABG   . Hyperlipidemia associated with type 2 diabetes mellitus (HCC)   . Hypertension   . Tobacco abuse   . Type II diabetes mellitus with complication (HCC)    History of stroke and CAD-CABG    Patient Active Problem List   Diagnosis Date Noted  . Atypical chest pain 12/21/2018  . Anxiety 05/23/2018  . Leg edema, right 04/19/2018  . History of CVA (cerebrovascular accident) 04/11/2018  . CAD S/P percutaneous coronary angioplasty 04/11/2018  . Pleuritic chest pain 04/11/2018  . Hx of CABG 04/03/2018  . Ischemic cardiomyopathy - 04/03/2018  . Tobacco abuse   . Acute ST elevation myocardial infarction (STEMI) of posterior wall (HCC) 04/02/2018  . Ventricular tachycardia, sustained (HCC) -n setting of  STEMI   . Essential hypertension 07/05/2016  . Diabetes mellitus type 2, controlled, with complications (HCC) 07/05/2016  . Hyperlipidemia LDL goal <70 07/05/2016  . Cigarette smoker 07/05/2016  . Marijuana abuse 07/05/2016  . Obesity 07/05/2016  . Rheumatoid arthritis (HCC) 07/05/2016  . Cerebral infarction (HCC) - L posterior limb internal capsule d/t small vessel disease, s/p IV tPA 07/02/2016    Past Surgical History:  Procedure Laterality Date  . CORONARY ARTERY BYPASS GRAFT    . CORONARY/GRAFT ACUTE MI REVASCULARIZATION N/A 04/02/2018   Procedure: Coronary/Graft Acute MI Revascularization;  Surgeon:  [name], MD;  Location: [facility] INVASIVE CV LAB;  Service: Cardiovascular: Thrombotic 95% SVG-L FIE:PPIR - thrombectomy -> DES PCI Synergy DES 3.5 x 20 (3.9 mm)  . KNEE SURGERY    . LEFT HEART CATH AND CORS/GRAFTS ANGIOGRAPHY N/A 04/02/2018   Procedure: LEFT HEART CATH AND CORS/GRAFTS ANGIOGRAPHY;  Surgeon: [name], MD;  Location: [facility] INVASIVE CV LAB;  Service: Cardiovascular: Ostial Left Main 50%; distal Left Main-pLAD 100% - d(apical)LAD 95 (after patent LIMA-LAD).OstRI - 99%; ost-mLCx 95%, dCx 95% & OM2 95%; ost-pRCA (co-dominant) 99%mRCA 80%; SVG-LPDA 99% ostial thrombosis; EF 30% with a EDP 31 mmHg  . MUSCLE REPAIR    . TONSILLECTOMY          Home Medications    Prior to Admission medications   Medication Sig Start Date End Date Taking? Authorizing Provider  amiodarone (PACERONE) 100 MG tablet Take 100 mg by mouth daily.    [provider]  atorvastatin (LIPITOR) 80 MG tablet Take 1 tablet (80 mg total) by mouth daily at 6 PM. 04/05/18   Arty Baumgartner, NP  empagliflozin (JARDIANCE) 10 MG TABS tablet Take 10 mg by mouth daily. 04/05/18   Arty Baumgartner, NP  Lancets [facility] ULTRASOFT) lancets Use as instructed 07/05/16   Layne Benton, NP  metFORMIN (GLUCOPHAGE) 500 MG tablet Take 1 tablet (500 mg total) by mouth 2 (two) times daily with a meal. 04/11/18   Kilroy, Eda Paschal, PA-C  nitroGLYCERIN (NITROSTAT) 0.4 MG SL tablet Place 1 tablet (0.4 mg total) under the tongue every 5 (five) minutes as needed. 04/05/18   Arty Baumgartner, NP  ticagrelor (BRILINTA) 90 MG TABS tablet Take 1 tablet (90 mg total) by mouth 2 (two) times daily. 06/06/18   [name], MD  valsartan (DIOVAN) 40 MG tablet Take 1 tablet (40 mg total) by mouth daily. 04/05/18 07/04/18  Arty Baumgartner, NP    Family History Family History  Problem Relation Age of Onset  . CAD Mother        CABG 81's, died 85's    Social History Social History   Tobacco Use  . Smoking status:  Current Every Day Smoker  . Smokeless tobacco: Never Used  Substance Use Topics  . Alcohol use: No  . Drug use: Yes    Types: Marijuana     Allergies   Codeine and Penicillins   Review of Systems Review of Systems  Constitutional: Negative for fever.  Respiratory: Positive for shortness of breath. Negative for cough.   Cardiovascular: Positive for chest pain.  Gastrointestinal: Negative for abdominal pain, nausea and vomiting.  Genitourinary: Negative for dysuria and hematuria.  Neurological: Negative for headaches.  All other systems reviewed and are negative.    Physical Exam Updated Vital Signs BP (!) 141/80   Pulse 77   Temp 97.8 F (36.6 C) (Oral)   Resp 18   Ht  6\' 1"  (1.854 m)   Wt 108 kg   SpO2 100%   BMI 31.41 kg/m   Physical Exam Vitals signs and nursing note reviewed.  Constitutional:      Appearance: Normal appearance. He is well-developed.  HENT:     Head: Normocephalic and atraumatic.  Eyes:     General: Lids are normal.     Conjunctiva/sclera: Conjunctivae normal.     Pupils: Pupils are equal, round, and reactive to light.  Neck:     Musculoskeletal: Full passive range of motion without pain.  Cardiovascular:     Rate and Rhythm: Normal rate and regular rhythm.     Pulses: Normal pulses.          Radial pulses are 2+ on the right side and 2+ on the left side.       Dorsalis pedis pulses are 2+ on the right side and 2+ on the left side.     Heart sounds: Normal heart sounds. No murmur. No friction rub. No gallop.   Pulmonary:     Effort: Pulmonary effort is normal.     Breath sounds: Normal breath sounds.     Comments: Lungs clear to auscultation bilaterally.  Symmetric chest rise.  No wheezing, rales, rhonchi. Chest:     Chest wall: No tenderness.  Abdominal:     Palpations: Abdomen is soft. Abdomen is not rigid.     Tenderness: There is no abdominal tenderness. There is no guarding.     Comments: Abdomen is soft, non-distended,  non-tender. No rigidity, No guarding. No peritoneal signs.  Musculoskeletal: Normal range of motion.     Comments: 1+ pitting edema noted to the BLE that is at the mid tib/fib region. No overlying warmth, erythema.   Skin:    General: Skin is warm and dry.     Capillary Refill: Capillary refill takes less than 2 seconds.  Neurological:     Mental Status: He is alert and oriented to person, place, and time.  Psychiatric:        Speech: Speech normal.      ED Treatments / Results  Labs (all labs ordered are listed, but only abnormal results are displayed) Labs Reviewed  COMPREHENSIVE METABOLIC PANEL - Abnormal; Notable for the following components:      Result Value   Sodium 133 (*)    Glucose, Bld 178 (*)    All other components within normal limits  TROPONIN I  CBC WITH DIFFERENTIAL/PLATELET  BRAIN NATRIURETIC PEPTIDE  HEPARIN LEVEL (UNFRACTIONATED)  HEPARIN LEVEL (UNFRACTIONATED)  CBC    EKG EKG Interpretation  Date/Time:  Thursday December 21 2018 15:39:28 EST Ventricular Rate:  73 PR Interval:  158 QRS Duration: 100 QT Interval:  398 QTC Calculation: 439 R Axis:   -36 Text Interpretation:  Sinus rhythm Ventricular premature complex Left axis deviation Minimal ST depression, inferior leads No significant change since last tracing earlier in the day  Confirmed by Richardean Canal ([id number]) on 12/21/2018 3:48:05 PM   Radiology Dg Chest 2 View  Result Date: 12/21/2018 CLINICAL DATA:  Chest pain.  Shortness of breath. EXAM: CHEST - 2 VIEW COMPARISON:  One-view chest x-ray 12/19/2018 FINDINGS: Heart size is normal. There is no edema or effusion. No focal airspace disease is present. The patient is status post median sternotomy for CABG. Aortic atherosclerosis is present. IMPRESSION: 1. No acute cardiopulmonary disease or significant interval change. 2. Aortic atherosclerosis. Electronically Signed   By: Marin Roberts M.D.   On:  12/21/2018 13:40    Procedures .Critical  Care Performed by: Maxwell CaulLayden, Breeanna Galgano A, PA-C Authorized by: Maxwell CaulLayden, Caston Coopersmith A, PA-C   Critical care provider statement:    Critical care time (minutes):  45   Critical care was necessary to treat or prevent imminent or life-threatening deterioration of the following conditions:  Circulatory failure and cardiac failure   Critical care was time spent personally by me on the following activities:  Discussions with consultants, evaluation of patient's response to treatment, examination of patient, ordering and performing treatments and interventions, ordering and review of laboratory studies, ordering and review of radiographic studies, pulse oximetry, re-evaluation of patient's condition, obtaining history from patient or surrogate and review of old charts   (including critical care time)  Medications Ordered in ED Medications  nitroGLYCERIN (NITROSTAT) SL tablet 0.4 mg (has no administration in time range)  heparin ADULT infusion 100 units/mL (25000 units/21850mL sodium chloride 0.45%) (1,250 Units/hr Intravenous Transfusing/Transfer 12/21/18 1802)  heparin 25000-0.45 UT/250ML-% infusion (has no administration in time range)  morphine 4 MG/ML injection 4 mg (4 mg Intravenous Given 12/21/18 1406)  heparin bolus via infusion 4,000 Units (4,000 Units Intravenous Bolus from Bag 12/21/18 1607)     Initial Impression / Assessment and Plan / ED Course  I have reviewed the triage vital signs and the nursing notes.  Pertinent labs & imaging results that were available during my care of the patient were reviewed by me and considered in my medical decision making (see chart for details).     68 y.o. M with PMH/o CAD, CABG who presents for evaluation of chest pain.  He reports he was seen here 2 days ago for similar symptoms.  At that time, his EKG showed some new ST depressions that was concerning for ischemic changes.  Patient states that he did not want to be admitted and left AMA.  Patient reports he had  been feeling better until today when he was [place] [employer].  He states that while he was walking around the store, he started having chest pain again and they felt like pressure.  Patient reports he was having difficulty continue walking in the store and was worried that he is not can be able to make it to his car.  He states over the last day, he has been more fatigued.  Does not really endorse much shortness of breath but states that he has been having the chest pain. Patient is afebrile, non-toxic appearing, sitting comfortably on examination table. Vital signs reviewed and stable.  Return for ACS etiology versus CHF etiology versus infectious etiology.  Plan to check labs, EKG, chest x-ray.  BMP is unremarkable.  CMP is unremarkable.  Initial troponin negative.  CBC shows no significant leukocytosis or anemia.  Chest x-ray negative for any infectious etiology.  BNP is normal.  Patient's risk factors, presentation, he has a heart score of 6.  Given concerns for unstable angina, recommend admission.  Patient is agreeable.  We will plan to consult cardiology.  Discussed with Dr. Mayford Knifeurner (cardiology).  Agrees with plan for admission.  Recommend starting patient on heparin bolus, IV heparin. Cardiology will plan to consult.   Discussed with hospitalist.  Accepted patient for admission.  Final Clinical Impressions(s) / ED Diagnoses   Final diagnoses:  Unstable angina [facility](HCC)    ED Discharge Orders    None       Maxwell CaulLayden, Jaslynne Dahan A, PA-C 12/21/18 1825    Little, Ambrose Finlandachel Morgan, MD 12/24/18 1212

## 2018-12-21 NOTE — ED Notes (Signed)
Pt is constantly moving and shifting in bed, and his BP is not accurate. When nurse stood at bedside and made sure pt did not move, his pressure was 145/97.

## 2018-12-22 ENCOUNTER — Encounter (HOSPITAL_COMMUNITY)
Admission: EM | Discharge status: Against Medical Advice | Payer: Self-pay | Source: Home / Self Care | Attending: Emergency Medicine

## 2018-12-22 DIAGNOSIS — I10 Essential (primary) hypertension: Secondary | ICD-10-CM | POA: Diagnosis not present

## 2018-12-22 DIAGNOSIS — E785 Hyperlipidemia, unspecified: Secondary | ICD-10-CM

## 2018-12-22 DIAGNOSIS — I200 Unstable angina: Secondary | ICD-10-CM | POA: Diagnosis not present

## 2018-12-22 DIAGNOSIS — I25110 Atherosclerotic heart disease of native coronary artery with unstable angina pectoris: Secondary | ICD-10-CM | POA: Diagnosis not present

## 2018-12-22 LAB — CBC
HCT: 44 % (ref 39.0–52.0)
Hemoglobin: 14.5 g/dL (ref 13.0–17.0)
MCH: 28.1 pg (ref 26.0–34.0)
MCHC: 33 g/dL (ref 30.0–36.0)
MCV: 85.3 fL (ref 80.0–100.0)
NRBC: 0 % (ref 0.0–0.2)
Platelets: 196 10*3/uL (ref 150–400)
RBC: 5.16 MIL/uL (ref 4.22–5.81)
RDW: 14.4 % (ref 11.5–15.5)
WBC: 8.9 10*3/uL (ref 4.0–10.5)

## 2018-12-22 LAB — LIPID PANEL
Cholesterol: 161 mg/dL (ref 0–200)
HDL: 36 mg/dL — ABNORMAL LOW (ref >40)
LDL Cholesterol: 79 mg/dL (ref 0–99)
Total CHOL/HDL Ratio: 4.5 RATIO
Triglycerides: 229 mg/dL — ABNORMAL HIGH (ref <150)
VLDL: 46 mg/dL — ABNORMAL HIGH (ref 0–40)

## 2018-12-22 LAB — GLUCOSE, CAPILLARY
Glucose-Capillary: 166 mg/dL — ABNORMAL HIGH (ref 70–99)
Glucose-Capillary: 189 mg/dL — ABNORMAL HIGH (ref 70–99)

## 2018-12-22 LAB — TROPONIN I
Troponin I: <0.03 ng/mL (ref <0.03)
Troponin I: <0.03 ng/mL (ref <0.03)

## 2018-12-22 LAB — HEPARIN LEVEL (UNFRACTIONATED): Heparin Unfractionated: 0.41 IU/mL (ref 0.30–0.70)

## 2018-12-22 LAB — HIV ANTIBODY (ROUTINE TESTING W REFLEX): HIV Screen 4th Generation wRfx: NONREACTIVE

## 2018-12-22 SURGERY — LEFT HEART CATH AND CORS/GRAFTS ANGIOGRAPHY
Anesthesia: LOCAL

## 2018-12-22 MED ORDER — NICOTINE 14 MG/24HR TD PT24
14.0000 mg | MEDICATED_PATCH | Freq: Every day | TRANSDERMAL | Status: DC
  Administered 2018-12-22: 14 mg via TRANSDERMAL
  Filled 2018-12-22: qty 1

## 2018-12-22 MED ORDER — MORPHINE SULFATE (PF) 2 MG/ML IV SOLN
2.0000 mg | Freq: Once | INTRAVENOUS | Status: AC
  Administered 2018-12-22: 2 mg via INTRAVENOUS
  Filled 2018-12-22: qty 1

## 2018-12-22 NOTE — Progress Notes (Signed)
Pt states to oncoming RN Gershon Cull) that he is leaving AMA. Pt states he was never brought broth that he asked for. RN Maralyn Sago) asked pt at 1052 if he wanted broth and pt stated he only wanted water. Pt screams at RN, "Take out these IV's or I'm ripping them out." MD is aware that pt is leaving AMA. Pt states he is not waiting for cardiology to see him. Cardiology aware. Pt walked out of hospital and states he has a ride. AMA paper in chart.

## 2018-12-22 NOTE — Progress Notes (Signed)
Patient experienced outbursts of frustration twice throughout the shift. He verbalized anger about his chest pain not being fully resolved over the past 6 months. He refused prn sublingual nitro. He also threatened to leave AMA. I pleaded with him to allow me to call the evening medical coverage to inquire of a stronger pain med as no prn pain med was ordered other than tylenol. Patient agreed. Paged [facility] and was given an order for 2mg  morphine which was given to the patient. He later thanked me for paging for additional pain med. His pain was relieved.

## 2018-12-22 NOTE — Progress Notes (Signed)
Patient called out for ice chipped. When arriving to his room I reminded him that he was NPO due to a test. He said he had already drank a little water. I educated the patient on the importance of remaining NPO for his safety. He said he was unaware that he couldn't eat or drink anything after MN. The off-going RN Gershon Cull as well as Randa Evens (NT) both shared that the patient was well informed not to eat. He had mentioned that he was aware he couldn't eat.

## 2018-12-22 NOTE — Care Management Obs Status (Signed)
MEDICARE OBSERVATION STATUS NOTIFICATION   Patient Details  Name: Matthew Moses MRN: [id number] Date of Birth: 03/26/1951   Medicare Observation Status Notification Given:  Yes    Colleen Can RN, BSN, NCM-BC, ACM-RN [phone] 12/22/2018, 11:28 AM

## 2018-12-22 NOTE — Progress Notes (Signed)
Progress Note  Patient Name: Matthew Moses Date of Encounter: 12/22/2018  Primary Cardiologist: Matthew BattyJonathan Berry, MD   Subjective   Patient was admitted last night for unstable angina. He continues to report mild substernal chest pain this morning that he ranks as a 3-4/10. He states this has been normal for him recently. No shortness of breath.  Patient is scheduled for left heart catheterization later today.  Inpatient Medications    Scheduled Meds: . amiodarone  100 mg Oral Daily  . atorvastatin  80 mg Oral q1800  . insulin aspart  0-9 Units Subcutaneous TID WC  . isosorbide mononitrate  15 mg Oral Daily  . metoprolol tartrate  12.5 mg Oral BID  . sodium chloride flush  3 mL Intravenous Q12H  . ticagrelor  90 mg Oral BID   Continuous Infusions: . sodium chloride    . sodium chloride 1 mL/kg/hr (12/22/18 0547)  . heparin 1,400 Units/hr (12/22/18 0548)   PRN Meds: sodium chloride, acetaminophen **OR** acetaminophen, nitroGLYCERIN, sodium chloride flush   Vital Signs    Vitals:   12/21/18 1844 12/21/18 2042 12/21/18 2235 12/22/18 0550  BP: 120/79  (!) 124/95 (!) 105/94  Pulse: 65  69 78  Resp: 18  18 18   Temp: 97.7 F (36.5 C)  97.7 F (36.5 C) 97.8 F (36.6 C)  TempSrc: Oral  Oral Oral  SpO2: 97%  99% 99%  Weight:  108 kg    Height:        Intake/Output Summary (Last 24 hours) at 12/22/2018 0849 Last data filed at 12/21/2018 2342 Gross per 24 hour  Intake 35.6 ml  Output -  Net 35.6 ml   Last 3 Weights 12/21/2018 12/21/2018 12/19/2018  Weight (lbs) 238 lb 1.6 oz 238 lb 1.6 oz 240 lb  Weight (kg) 108 kg 108 kg 108.863 kg      Telemetry    Sinus rhythm with heart rates in the 50's to 70's.  - Personally Reviewed  ECG    No new ECG tracing today. - Personally Reviewed  Physical Exam   GEN: Caucasian male resting comfortable. Alert and in no acute distress.   Neck: Supple. Cardiac: Mildly bradycardic with regular rhythm. No murmurs, rubs, or gallops.    Respiratory: Clear to auscultation bilaterally. No wheezes, rhonchi, or rales. GI: Soft, non-tender, non-distended  MS: No significant lower extremity edema. No deformity. Neuro:  No focal deficits. Psych: Normal affect.  Labs    Chemistry Recent Labs  Lab 12/19/18 1258 12/21/18 1327  NA 135 133*  K 4.0 4.9  CL 99 98  CO2 25 26  GLUCOSE 148* 178*  BUN 24* 22  CREATININE 0.80 0.94  CALCIUM 9.5 9.7  PROT 8.0 8.0  ALBUMIN 4.2 4.2  AST 26 21  ALT 30 27  ALKPHOS 68 70  BILITOT 0.8 0.8  GFRNONAA >60 >60  GFRAA >60 >60  ANIONGAP 11 9     Hematology Recent Labs  Lab 12/19/18 1258 12/21/18 1327 12/22/18 0236  WBC 10.9* 10.4 8.9  RBC 5.56 5.41 5.16  HGB 15.9 15.4 14.5  HCT 48.2 47.8 44.0  MCV 86.7 88.4 85.3  MCH 28.6 28.5 28.1  MCHC 33.0 32.2 33.0  RDW 14.6 14.6 14.4  PLT 224 213 196    Cardiac Enzymes Recent Labs  Lab 12/19/18 1258 12/21/18 1327 12/21/18 2054 12/22/18 0236  TROPONINI <0.03 <0.03 <0.03 <0.03   No results for input(s): TROPIPOC in the last 168 hours.   BNP Recent Labs  Lab 12/21/18 1327  BNP 55.1     DDimer No results for input(s): DDIMER in the last 168 hours.   Radiology    Dg Chest 2 View  Result Date: 12/21/2018 CLINICAL DATA:  Chest pain.  Shortness of breath. EXAM: CHEST - 2 VIEW COMPARISON:  One-view chest x-ray 12/19/2018 FINDINGS: Heart size is normal. There is no edema or effusion. No focal airspace disease is present. The patient is status post median sternotomy for CABG. Aortic atherosclerosis is present. IMPRESSION: 1. No acute cardiopulmonary disease or significant interval change. 2. Aortic atherosclerosis. Electronically Signed   By: Matthew Moses M.D.   On: 12/21/2018 13:40    Cardiac Studies   Left Heart Catheterization 04/02/2018:  Ost LM to Mid LM lesion is 50% stenosed.  Dist LM to Prox LAD lesion is 100% stenosed.  Ost Ramus to Ramus lesion is 99% stenosed.  Ost RCA to Prox RCA lesion is 99%  stenosed.  Mid RCA lesion is 80% stenosed.  Ost Cx to Mid Cx lesion is 95% stenosed.  Ost 2nd Mrg to 2nd Mrg lesion is 95% stenosed.  Dist Cx lesion is 95% stenosed.  Dist LAD lesion is 95% stenosed.  Prox Graft lesion is 99% stenosed.  A stent was successfully placed.  Post intervention, there is a 0% residual stenosis.  There is severe left ventricular systolic dysfunction.  LV end diastolic pressure is severely elevated.  Impression: Mr. Matthew Moses has severe native vessel disease with an occluded LAD, subtotally occluded nondominant to codominant RCA, circumflex and ramus branch.  His culprit vessel was the vein graft to a distal PLA that had a high-grade proximal thrombotic stenosis.  LIMA was intact.  I successfully stented his vein graft with a 3.5 mm drug-eluting stent using distal protection.  There is no distal embolization.  Angiomax will continue for 4 hours and Aggrastat for 18 hours.  Patient will need dual antiplatelet therapy uninterrupted for at least 12 months.  2D echo is pending.  He may need a LifeVest prior to discharge depending on his ejection fraction.  He will need aggressive risk factor modification including high-dose statin therapy, dual antiplatelet therapy, beta blockade and smoking cessation.  He will also need diabetic education.  He left the lab in stable condition. _______________  Echocardiogram 04/02/2018: Study Conclusions: - Left ventricle: The cavity size was normal. There was mild   concentric hypertrophy. Systolic function was mildly to   moderately reduced. The estimated ejection fraction was in the   range of 40% to 45%. There is hypokinesis in the basal and mid   inferoseptal , inferior and inferolateral walls. Features are   consistent with a pseudonormal left ventricular filling pattern,   with concomitant abnormal relaxation and increased filling   pressure (grade 2 diastolic dysfunction). Doppler parameters are   consistent with elevated  ventricular end-diastolic filling   pressure. - Aortic valve: There was no regurgitation. - Mitral valve: There was mild regurgitation. - Left atrium: The atrium was moderately dilated. - Right ventricle: The cavity size was normal. Wall thickness was   normal. Systolic function was normal. - Right atrium: The atrium was normal in size. - Tricuspid valve: There was trivial regurgitation. - Pulmonary arteries: Systolic pressure was within the normal   range. - Inferior vena cava: The vessel was normal in size. - Pericardium, extracardiac: There was no pericardial effusion.  Impressions: - There is hypokinesis in the basal and mid inferoseptal , inferior   and inferolateral walls. LVEF 40-45% ,  previously 60-65% on the   echopcardiogram from 07/04/2016.  Patient Profile   Mr. Broad is a 68 y.o. male with a history of CAD s/p CABG in 2012 with posterior STEMI in 03/2018, hypertension, hyperlipidemia, and type 2 diabetes mellitus who is being seen today for evaluation of chest pain at the request of Dr. Robb Matar.  Assessment & Plan    Unstable Angina  - Patient presents with both typical and atypical symptoms.  - EKG shows accentuated ST depression. - Troponin negative x3.  - Patient continues to report mild chest pain.  - Continue IV Heparin drip. - Continue Aspirin, Brilinta, statin, beta-blocker, and Imdur. - Patient is scheduled for cardiac catheterization later today. The patient understands that risks include but are not limited to stroke (1 in 1000), death (1 in 1000), kidney failure [usually temporary] (1 in 500), bleeding (1 in 200), allergic reaction [possibly serious] (1 in 200), and agrees to proceed.   CAD - Patient s/p CABG in 2012 and STEMI in 03/2018 with thombus removed from saphenous vein graft to first obtuse marginal and a stent was successfully placed.  He also had diffusely diseased RCA. - Continue dual antiplatelet therapy with Aspirin and Brilinta. - Continue  statin and low-dose beta blocker as heart rate allows. - Patient also on Amiodarone due to VT with his previous MI.  Ischemic Cardiomyopathy - LVEF 40-45% post MI. - Continue low-dose beta blocker. - Patient was ordered ARB per previous notes but states he has not been taking any blood pressure medications.   Hypertension - Most recent BP 105/94. - Continue low dose beta blocker.  Hyperlipidemia - Lipid panel this admission: Cholesterol 161, Triglycerides 229, HDL 36, LDL 79. - LDL goal <70 given CAD. - Patient was started on Lipitor  daily after MI but has since stopped taking it. Has been restarted this admission.  Type 2 Diabetes Mellitus  - Holding home Metformin prior to cardiac catheterization. Can restart 48 hours post cath. - Management per primary team.  Tobacco Abuse - Patient has been a 1/2 pack per day smoker. He states he has not smoked since Tuesday and he wants to quit.  - Will order Nicotine patch per patient's request.     For questions or updates, please contact [facility] Please consult [email or web address] for contact info under        Signed, Corrin Parker, PA-C  12/22/2018, 8:49 AM

## 2018-12-22 NOTE — Discharge Summary (Signed)
Patient initially presented with complaints of chest pain and signs of new ST wave depressions.  Patient had been placed on a heparin drip and seen by cardiology who noted had recommended cardiac catheterization for further evaluation.  However, prior to being able to evaluate the patient I was paged by the nurse staff that the patient was requesting to leave AMA.  Patient reports that he he had a prolonged wait for chicken broth and water.  He had requested that the nurse take out his IV.    Patient Left AMA: The patient was extensively counseled that he did have a very serious condition which required medical attention. He was informed that the consequence of leaving AMA which could include death or serious injury.  Patient is alert and oriented to person place, time, and circumstance.  He did acknowledge these risks and showed understanding.  He appears to decision-making capacity and ultimately signed out AMA.  Cardiology was made aware and noted that patient had been talking about leaving earlier in the morning.

## 2018-12-22 NOTE — Progress Notes (Signed)
ANTICOAGULATION CONSULT NOTE   Pharmacy Consult for Heparin Indication: chest pain/ACS  Allergies  Allergen Reactions  . Penicillins Itching and Rash    Has patient had a PCN reaction causing immediate rash, facial/tongue/throat swelling, SOB or lightheadedness with hypotension: Yes Has patient had a PCN reaction causing severe rash involving mucus membranes or skin necrosis: No Has patient had a PCN reaction that required hospitalization: No Has patient had a PCN reaction occurring within the last 10 years: No If all of the above answers are "NO", then may proceed with Cephalosporin use.  . Codeine Hives   Patient Measurements: Height: 6\' 1"  (185.4 cm) Weight: 238 lb 1.6 oz (108 kg) IBW/kg (Calculated) : 79.9 Heparin Dosing Weight: 102kg  Vital Signs: Temp: 97.7 F (36.5 C) (01/30 2235) Temp Source: Oral (01/30 2235) BP: 124/95 (01/30 2235) Pulse Rate: 69 (01/30 2235)  Labs: Recent Labs    12/19/18 1258 12/21/18 1327 12/21/18 2054 12/22/18 0236  HGB 15.9 15.4  --  14.5  HCT 48.2 47.8  --  44.0  PLT 224 213  --  196  LABPROT 11.9  --   --   --   INR 0.88  --   --   --   HEPARINUNFRC  --   --  0.22* 0.41  CREATININE 0.80 0.94  --   --   TROPONINI <0.03 <0.03 <0.03  --     Estimated Creatinine Clearance: 101 mL/min (by C-G formula based on SCr of 0.94 mg/dL).   Medical History: Past Medical History:  Diagnosis Date  . CAD, multiple vessel    Severe native CAD involving left main, LAD, RCA, ramus intermedius and PDA -> status post CABG x2 (LIMA-LAD, SVG-L PDA)  . Coronary artery disease involving coronary bypass graft of native heart with angina pectoris (HCC) - thrombotic 95% oclusion of SVG-LPDA 04/03/2018   occluded LAD, subtotally occluded nondominant to codominant RCA, LCx & RI.  LIMA-LAD was intact. CULPRIT LESION was the SVG-dLPLA that had a high-grade proximal thrombotic stenosis.  S/P successful PCI of SVG-LPDA stented his vein graft with a 3.5 mm  drug-eluting stent using distal protection.  [name] Kitchen Hx of CABG   . Hyperlipidemia associated with type 2 diabetes mellitus (HCC)   . Hypertension   . Tobacco abuse   . Type II diabetes mellitus with complication (HCC)    History of stroke and CAD-CABG    Assessment: 89 YOM presenting with CP with ST dep and pharmacy consulted to start heparin.  No anticoagulation PTA, on Brilinta, CBC wnl.  1/31 AM update: heparin level therapeutic this AM  Goal of Therapy:  Heparin level 0.3-0.7 units/ml Monitor platelets by anticoagulation protocol: Yes   Plan:  Cont heparin 1400 units/hr Re-check heparin level in 8 hours to confirm  Abran Duke, PharmD, BCPS Clinical Pharmacist Phone: [phone]

## 2018-12-22 NOTE — Progress Notes (Signed)
   Received page from RN at 11:34am that patient did not want to wait until 2:00pm for his cardiac catheterization and wanted to leave AMA. By the time I made it to the patient's room around 11:45am, the patient had already left. Per RN, patient had stated "Take these IVs out or I'm ripping them out." RN had patient sign AMA papers and they are in his chart.  Corrin Parker, PA-C 12/22/2018 12:44 PM

## 2018-12-22 NOTE — Progress Notes (Signed)
MD states pt can have Clear Liquids. Pt states he only wants water. RN took pt water to drink.

## 2018-12-25 NOTE — Telephone Encounter (Signed)
LMTCB for appt reschedule

## 2019-01-01 NOTE — Telephone Encounter (Signed)
LMTCB for appt reschedule; 3rd attempt

## 2019-01-16 ENCOUNTER — Ambulatory Visit: Payer: Medicaid Other | Admitting: Physician Assistant

## 2019-03-20 ENCOUNTER — Other Ambulatory Visit: Payer: Self-pay | Admitting: Cardiology

## 2019-03-28 ENCOUNTER — Telehealth: Payer: Self-pay | Admitting: Physician Assistant

## 2019-03-28 NOTE — Telephone Encounter (Signed)
New Message   Calling to make sure fax was received from [employer] for patient.

## 2019-03-28 NOTE — Telephone Encounter (Signed)
Wanting to make sure pt is on statin and pt is on Atorvastatin 80 mg per med list .Matthew Moses

## 2019-04-30 ENCOUNTER — Other Ambulatory Visit (HOSPITAL_COMMUNITY): Payer: Medicare Other

## 2019-04-30 ENCOUNTER — Encounter (HOSPITAL_COMMUNITY)
Admission: EM | Disposition: A | Discharge status: Home / Self Care | Payer: Self-pay | Source: Home / Self Care | Attending: Cardiology

## 2019-04-30 ENCOUNTER — Emergency Department (HOSPITAL_COMMUNITY): Payer: Medicare Other

## 2019-04-30 ENCOUNTER — Inpatient Hospital Stay (HOSPITAL_COMMUNITY): Payer: Medicare Other | Admitting: Anesthesiology

## 2019-04-30 ENCOUNTER — Encounter (HOSPITAL_COMMUNITY): Payer: Self-pay | Admitting: Emergency Medicine

## 2019-04-30 ENCOUNTER — Other Ambulatory Visit: Payer: Self-pay

## 2019-04-30 ENCOUNTER — Inpatient Hospital Stay (HOSPITAL_COMMUNITY)
Admission: EM | Admit: 2019-04-30 | Discharge: 2019-05-01 | DRG: 246 | Disposition: A | Discharge status: Home / Self Care | Payer: Medicare Other | Attending: Cardiology | Admitting: Cardiology

## 2019-04-30 DIAGNOSIS — E1169 Type 2 diabetes mellitus with other specified complication: Secondary | ICD-10-CM | POA: Diagnosis present

## 2019-04-30 DIAGNOSIS — Z951 Presence of aortocoronary bypass graft: Secondary | ICD-10-CM

## 2019-04-30 DIAGNOSIS — I252 Old myocardial infarction: Secondary | ICD-10-CM

## 2019-04-30 DIAGNOSIS — Y831 Surgical operation with implant of artificial internal device as the cause of abnormal reaction of the patient, or of later complication, without mention of misadventure at the time of the procedure: Secondary | ICD-10-CM | POA: Diagnosis present

## 2019-04-30 DIAGNOSIS — T82855A Stenosis of coronary artery stent, initial encounter: Secondary | ICD-10-CM | POA: Diagnosis present

## 2019-04-30 DIAGNOSIS — E785 Hyperlipidemia, unspecified: Secondary | ICD-10-CM | POA: Diagnosis present

## 2019-04-30 DIAGNOSIS — Z09 Encounter for follow-up examination after completed treatment for conditions other than malignant neoplasm: Secondary | ICD-10-CM

## 2019-04-30 DIAGNOSIS — I25709 Atherosclerosis of coronary artery bypass graft(s), unspecified, with unspecified angina pectoris: Secondary | ICD-10-CM | POA: Diagnosis present

## 2019-04-30 DIAGNOSIS — M79A11 Nontraumatic compartment syndrome of right upper extremity: Secondary | ICD-10-CM | POA: Diagnosis not present

## 2019-04-30 DIAGNOSIS — I110 Hypertensive heart disease with heart failure: Secondary | ICD-10-CM | POA: Diagnosis present

## 2019-04-30 DIAGNOSIS — Y848 Other medical procedures as the cause of abnormal reaction of the patient, or of later complication, without mention of misadventure at the time of the procedure: Secondary | ICD-10-CM | POA: Diagnosis present

## 2019-04-30 DIAGNOSIS — Z8673 Personal history of transient ischemic attack (TIA), and cerebral infarction without residual deficits: Secondary | ICD-10-CM

## 2019-04-30 DIAGNOSIS — Z9889 Other specified postprocedural states: Secondary | ICD-10-CM

## 2019-04-30 DIAGNOSIS — I2510 Atherosclerotic heart disease of native coronary artery without angina pectoris: Secondary | ICD-10-CM | POA: Diagnosis present

## 2019-04-30 DIAGNOSIS — F17210 Nicotine dependence, cigarettes, uncomplicated: Secondary | ICD-10-CM | POA: Diagnosis present

## 2019-04-30 DIAGNOSIS — E782 Mixed hyperlipidemia: Secondary | ICD-10-CM

## 2019-04-30 DIAGNOSIS — I10 Essential (primary) hypertension: Secondary | ICD-10-CM | POA: Diagnosis not present

## 2019-04-30 DIAGNOSIS — I25720 Atherosclerosis of autologous artery coronary artery bypass graft(s) with unstable angina pectoris: Secondary | ICD-10-CM

## 2019-04-30 DIAGNOSIS — Z6831 Body mass index (BMI) 31.0-31.9, adult: Secondary | ICD-10-CM

## 2019-04-30 DIAGNOSIS — Z8674 Personal history of sudden cardiac arrest: Secondary | ICD-10-CM | POA: Diagnosis not present

## 2019-04-30 DIAGNOSIS — I97630 Postprocedural hematoma of a circulatory system organ or structure following a cardiac catheterization: Secondary | ICD-10-CM | POA: Diagnosis not present

## 2019-04-30 DIAGNOSIS — I255 Ischemic cardiomyopathy: Secondary | ICD-10-CM | POA: Diagnosis present

## 2019-04-30 DIAGNOSIS — Z7902 Long term (current) use of antithrombotics/antiplatelets: Secondary | ICD-10-CM

## 2019-04-30 DIAGNOSIS — I214 Non-ST elevation (NSTEMI) myocardial infarction: Principal | ICD-10-CM

## 2019-04-30 DIAGNOSIS — I5043 Acute on chronic combined systolic (congestive) and diastolic (congestive) heart failure: Secondary | ICD-10-CM | POA: Diagnosis present

## 2019-04-30 DIAGNOSIS — I25110 Atherosclerotic heart disease of native coronary artery with unstable angina pectoris: Secondary | ICD-10-CM

## 2019-04-30 DIAGNOSIS — E669 Obesity, unspecified: Secondary | ICD-10-CM | POA: Diagnosis present

## 2019-04-30 DIAGNOSIS — Z8249 Family history of ischemic heart disease and other diseases of the circulatory system: Secondary | ICD-10-CM | POA: Diagnosis not present

## 2019-04-30 DIAGNOSIS — Z794 Long term (current) use of insulin: Secondary | ICD-10-CM | POA: Diagnosis not present

## 2019-04-30 DIAGNOSIS — E118 Type 2 diabetes mellitus with unspecified complications: Secondary | ICD-10-CM | POA: Diagnosis present

## 2019-04-30 DIAGNOSIS — Z1159 Encounter for screening for other viral diseases: Secondary | ICD-10-CM

## 2019-04-30 HISTORY — PX: CORONARY STENT INTERVENTION: CATH118234

## 2019-04-30 HISTORY — PX: FASCIOTOMY: SHX132

## 2019-04-30 HISTORY — PX: LEFT HEART CATH AND CORS/GRAFTS ANGIOGRAPHY: CATH118250

## 2019-04-30 LAB — CBC
HCT: 48 % (ref 39.0–52.0)
Hemoglobin: 15.9 g/dL (ref 13.0–17.0)
MCH: 28.9 pg (ref 26.0–34.0)
MCHC: 33.1 g/dL (ref 30.0–36.0)
MCV: 87.3 fL (ref 80.0–100.0)
Platelets: 231 10*3/uL (ref 150–400)
RBC: 5.5 MIL/uL (ref 4.22–5.81)
RDW: 13.1 % (ref 11.5–15.5)
WBC: 10 10*3/uL (ref 4.0–10.5)
nRBC: 0 % (ref 0.0–0.2)

## 2019-04-30 LAB — BASIC METABOLIC PANEL
Anion gap: 15 (ref 5–15)
BUN: 19 mg/dL (ref 8–23)
CO2: 23 mmol/L (ref 22–32)
Calcium: 9.3 mg/dL (ref 8.9–10.3)
Chloride: 98 mmol/L (ref 98–111)
Creatinine, Ser: 0.99 mg/dL (ref 0.61–1.24)
GFR calc Af Amer: >60 mL/min (ref >60)
GFR calc non Af Amer: >60 mL/min (ref >60)
Glucose, Bld: 252 mg/dL — ABNORMAL HIGH (ref 70–99)
Potassium: 3.9 mmol/L (ref 3.5–5.1)
Sodium: 136 mmol/L (ref 135–145)

## 2019-04-30 LAB — HEMOGLOBIN A1C
Hgb A1c MFr Bld: 10.6 % — ABNORMAL HIGH (ref 4.8–5.6)
Mean Plasma Glucose: 257.52 mg/dL

## 2019-04-30 LAB — GLUCOSE, CAPILLARY
Glucose-Capillary: 205 mg/dL — ABNORMAL HIGH (ref 70–99)
Glucose-Capillary: 240 mg/dL — ABNORMAL HIGH (ref 70–99)

## 2019-04-30 LAB — POCT ACTIVATED CLOTTING TIME
Activated Clotting Time: 202 seconds
Activated Clotting Time: 379 seconds
Activated Clotting Time: 467 seconds

## 2019-04-30 LAB — SARS CORONAVIRUS 2 BY RT PCR (HOSPITAL ORDER, PERFORMED IN ~~LOC~~ HOSPITAL LAB): SARS Coronavirus 2: NEGATIVE

## 2019-04-30 LAB — CBG MONITORING, ED
Glucose-Capillary: 202 mg/dL — ABNORMAL HIGH (ref 70–99)
Glucose-Capillary: 207 mg/dL — ABNORMAL HIGH (ref 70–99)

## 2019-04-30 LAB — TROPONIN I: Troponin I: 0.15 ng/mL (ref <0.03)

## 2019-04-30 LAB — HEPARIN LEVEL (UNFRACTIONATED): Heparin Unfractionated: 0.41 IU/mL (ref 0.30–0.70)

## 2019-04-30 SURGERY — LEFT HEART CATH AND CORS/GRAFTS ANGIOGRAPHY
Anesthesia: LOCAL

## 2019-04-30 SURGERY — FASCIOTOMY, UPPER EXTREMITY
Anesthesia: General | Site: Arm Lower | Laterality: Left

## 2019-04-30 MED ORDER — FUROSEMIDE 10 MG/ML IJ SOLN
INTRAMUSCULAR | Status: AC
  Filled 2019-04-30: qty 4

## 2019-04-30 MED ORDER — ASPIRIN EC 81 MG PO TBEC
81.0000 mg | DELAYED_RELEASE_TABLET | Freq: Every day | ORAL | Status: DC
  Administered 2019-05-01: 81 mg via ORAL
  Filled 2019-04-30: qty 1

## 2019-04-30 MED ORDER — SODIUM CHLORIDE 0.9 % IV SOLN
INTRAVENOUS | Status: DC | PRN
  Administered 2019-04-30: 25 ug/min via INTRAVENOUS

## 2019-04-30 MED ORDER — ETOMIDATE 2 MG/ML IV SOLN
INTRAVENOUS | Status: DC | PRN
  Administered 2019-04-30: 20 mg via INTRAVENOUS

## 2019-04-30 MED ORDER — FENTANYL CITRATE (PF) 250 MCG/5ML IJ SOLN
INTRAMUSCULAR | Status: AC
  Filled 2019-04-30: qty 5

## 2019-04-30 MED ORDER — IOHEXOL 350 MG/ML SOLN
INTRAVENOUS | Status: DC | PRN
  Administered 2019-04-30: 225 mL via INTRA_ARTERIAL

## 2019-04-30 MED ORDER — FUROSEMIDE 10 MG/ML IJ SOLN
40.0000 mg | Freq: Two times a day (BID) | INTRAMUSCULAR | Status: AC
  Administered 2019-04-30 – 2019-05-01 (×2): 40 mg via INTRAVENOUS
  Filled 2019-04-30 (×2): qty 4

## 2019-04-30 MED ORDER — FUROSEMIDE 10 MG/ML IJ SOLN
INTRAMUSCULAR | Status: DC | PRN
  Administered 2019-04-30: 40 mg via INTRAVENOUS

## 2019-04-30 MED ORDER — SODIUM CHLORIDE 0.9 % WEIGHT BASED INFUSION
1.0000 mL/kg/h | INTRAVENOUS | Status: DC
  Administered 2019-04-30: 0.184 mL/kg/h via INTRAVENOUS

## 2019-04-30 MED ORDER — HYDROMORPHONE HCL 1 MG/ML IJ SOLN
INTRAMUSCULAR | Status: AC
  Filled 2019-04-30: qty 1

## 2019-04-30 MED ORDER — ASPIRIN EC 325 MG PO TBEC: 325.0000 mg | DELAYED_RELEASE_TABLET | Freq: Once | ORAL | Status: DC

## 2019-04-30 MED ORDER — SODIUM CHLORIDE 0.9 % IV SOLN: 250.0000 mL | INTRAVENOUS | Status: DC | PRN

## 2019-04-30 MED ORDER — TICAGRELOR 90 MG PO TABS
90.0000 mg | ORAL_TABLET | Freq: Two times a day (BID) | ORAL | Status: DC
  Administered 2019-05-01: 90 mg via ORAL
  Filled 2019-04-30: qty 1

## 2019-04-30 MED ORDER — MORPHINE SULFATE (PF) 2 MG/ML IV SOLN
INTRAVENOUS | Status: AC
  Filled 2019-04-30: qty 1

## 2019-04-30 MED ORDER — DEXAMETHASONE SODIUM PHOSPHATE 4 MG/ML IJ SOLN
INTRAMUSCULAR | Status: DC | PRN
  Administered 2019-04-30: 5 mg via INTRAVENOUS

## 2019-04-30 MED ORDER — LACTATED RINGERS IV SOLN
INTRAVENOUS | Status: DC | PRN
  Administered 2019-04-30: 19:00:00 via INTRAVENOUS

## 2019-04-30 MED ORDER — VERAPAMIL HCL 2.5 MG/ML IV SOLN
INTRAVENOUS | Status: DC | PRN
  Administered 2019-04-30: 10 mL via INTRA_ARTERIAL

## 2019-04-30 MED ORDER — HEPARIN SODIUM (PORCINE) 1000 UNIT/ML IJ SOLN
INTRAMUSCULAR | Status: DC | PRN
  Administered 2019-04-30: 5500 [IU] via INTRAVENOUS
  Administered 2019-04-30: 5000 [IU] via INTRAVENOUS
  Administered 2019-04-30: 4000 [IU] via INTRAVENOUS

## 2019-04-30 MED ORDER — SODIUM CHLORIDE 0.9 % IV SOLN
INTRAVENOUS | Status: AC
  Filled 2019-04-30: qty 1.2

## 2019-04-30 MED ORDER — MIDAZOLAM HCL 5 MG/5ML IJ SOLN
INTRAMUSCULAR | Status: DC | PRN
  Administered 2019-04-30: 2 mg via INTRAVENOUS

## 2019-04-30 MED ORDER — BACITRACIN ZINC 500 UNIT/GM EX OINT
TOPICAL_OINTMENT | CUTANEOUS | Status: AC
  Filled 2019-04-30: qty 28.35

## 2019-04-30 MED ORDER — 0.9 % SODIUM CHLORIDE (POUR BTL) OPTIME
TOPICAL | Status: DC | PRN
  Administered 2019-04-30: 1000 mL

## 2019-04-30 MED ORDER — METOPROLOL TARTRATE 12.5 MG HALF TABLET: 6.2500 mg | ORAL_TABLET | Freq: Four times a day (QID) | ORAL | Status: DC

## 2019-04-30 MED ORDER — TICAGRELOR 90 MG PO TABS
ORAL_TABLET | ORAL | Status: DC | PRN
  Administered 2019-04-30: 180 mg via ORAL

## 2019-04-30 MED ORDER — HEPARIN BOLUS VIA INFUSION
4000.0000 [IU] | Freq: Once | INTRAVENOUS | Status: AC
  Administered 2019-04-30: 4000 [IU] via INTRAVENOUS
  Filled 2019-04-30: qty 4000

## 2019-04-30 MED ORDER — FENTANYL CITRATE (PF) 100 MCG/2ML IJ SOLN
50.0000 ug | Freq: Once | INTRAMUSCULAR | Status: AC
  Administered 2019-04-30: 50 ug via INTRAVENOUS
  Filled 2019-04-30: qty 2

## 2019-04-30 MED ORDER — HYDROMORPHONE HCL 1 MG/ML IJ SOLN
0.2500 mg | INTRAMUSCULAR | Status: DC | PRN
  Administered 2019-04-30 (×4): 0.5 mg via INTRAVENOUS

## 2019-04-30 MED ORDER — ASPIRIN 81 MG PO CHEW: 81.0000 mg | CHEWABLE_TABLET | ORAL | Status: DC

## 2019-04-30 MED ORDER — SODIUM CHLORIDE 0.9 % WEIGHT BASED INFUSION: 3.0000 mL/kg/h | INTRAVENOUS | Status: DC

## 2019-04-30 MED ORDER — SUCCINYLCHOLINE 20MG/ML (10ML) SYRINGE FOR MEDFUSION PUMP - OPTIME
INTRAMUSCULAR | Status: DC | PRN
  Administered 2019-04-30: 160 mg via INTRAVENOUS

## 2019-04-30 MED ORDER — HEPARIN SODIUM (PORCINE) 1000 UNIT/ML IJ SOLN
INTRAMUSCULAR | Status: AC
  Filled 2019-04-30: qty 1

## 2019-04-30 MED ORDER — ATORVASTATIN CALCIUM 80 MG PO TABS
80.0000 mg | ORAL_TABLET | Freq: Every day | ORAL | Status: DC
  Administered 2019-05-01: 80 mg via ORAL
  Filled 2019-04-30: qty 1

## 2019-04-30 MED ORDER — CLINDAMYCIN PHOSPHATE 900 MG/50ML IV SOLN
INTRAVENOUS | Status: DC | PRN
  Administered 2019-04-30: 900 mg via INTRAVENOUS

## 2019-04-30 MED ORDER — SUGAMMADEX SODIUM 200 MG/2ML IV SOLN
INTRAVENOUS | Status: DC | PRN
  Administered 2019-04-30: 250 mg via INTRAVENOUS

## 2019-04-30 MED ORDER — ATORVASTATIN CALCIUM 80 MG PO TABS: 80.0000 mg | ORAL_TABLET | Freq: Every day | ORAL | Status: DC

## 2019-04-30 MED ORDER — SODIUM CHLORIDE 0.9 % IV SOLN
INTRAVENOUS | Status: DC | PRN
  Administered 2019-04-30: 19:00:00 50 mL

## 2019-04-30 MED ORDER — HYDROMORPHONE HCL 1 MG/ML IJ SOLN
0.5000 mg | INTRAMUSCULAR | Status: AC | PRN
  Administered 2019-04-30 (×4): 0.5 mg via INTRAVENOUS

## 2019-04-30 MED ORDER — CLINDAMYCIN PHOSPHATE 900 MG/50ML IV SOLN
INTRAVENOUS | Status: AC
  Filled 2019-04-30: qty 50

## 2019-04-30 MED ORDER — SODIUM CHLORIDE 0.9% FLUSH: 3.0000 mL | INTRAVENOUS | Status: DC | PRN

## 2019-04-30 MED ORDER — LIDOCAINE HCL (PF) 1 % IJ SOLN
INTRAMUSCULAR | Status: AC
  Filled 2019-04-30: qty 30

## 2019-04-30 MED ORDER — METOPROLOL TARTRATE 25 MG/10 ML ORAL SUSPENSION
6.2500 mg | Freq: Four times a day (QID) | ORAL | Status: DC
  Administered 2019-05-01 (×2): 6.25 mg via ORAL
  Filled 2019-04-30 (×6): qty 5

## 2019-04-30 MED ORDER — ONDANSETRON HCL 4 MG/2ML IJ SOLN
4.0000 mg | Freq: Four times a day (QID) | INTRAMUSCULAR | Status: DC | PRN
  Administered 2019-04-30 (×2): 4 mg via INTRAVENOUS
  Filled 2019-04-30 (×2): qty 2

## 2019-04-30 MED ORDER — SODIUM CHLORIDE 0.9% FLUSH
3.0000 mL | Freq: Two times a day (BID) | INTRAVENOUS | Status: DC
  Administered 2019-05-01: 3 mL via INTRAVENOUS

## 2019-04-30 MED ORDER — ATORVASTATIN CALCIUM 80 MG PO TABS
80.0000 mg | ORAL_TABLET | ORAL | Status: AC
  Administered 2019-04-30: 80 mg via ORAL
  Filled 2019-04-30: qty 1

## 2019-04-30 MED ORDER — HEPARIN (PORCINE) IN NACL 1000-0.9 UT/500ML-% IV SOLN
INTRAVENOUS | Status: DC | PRN
  Administered 2019-04-30 (×2): 500 mL

## 2019-04-30 MED ORDER — PROPOFOL 10 MG/ML IV BOLUS
INTRAVENOUS | Status: AC
  Filled 2019-04-30: qty 20

## 2019-04-30 MED ORDER — FENTANYL CITRATE (PF) 100 MCG/2ML IJ SOLN
100.0000 ug | Freq: Once | INTRAMUSCULAR | Status: AC
  Administered 2019-04-30: 100 ug via INTRAVENOUS
  Filled 2019-04-30: qty 2

## 2019-04-30 MED ORDER — ASPIRIN 81 MG PO CHEW
324.0000 mg | CHEWABLE_TABLET | Freq: Once | ORAL | Status: AC
  Administered 2019-04-30: 05:00:00 324 mg via ORAL
  Filled 2019-04-30: qty 4

## 2019-04-30 MED ORDER — SODIUM CHLORIDE 0.9 % IV SOLN: INTRAVENOUS | Status: AC

## 2019-04-30 MED ORDER — MORPHINE SULFATE (PF) 2 MG/ML IV SOLN
2.0000 mg | Freq: Once | INTRAVENOUS | Status: AC
  Administered 2019-04-30: 2 mg via INTRAVENOUS

## 2019-04-30 MED ORDER — PROMETHAZINE HCL 25 MG/ML IJ SOLN
INTRAMUSCULAR | Status: AC
  Filled 2019-04-30: qty 1

## 2019-04-30 MED ORDER — FENTANYL CITRATE (PF) 100 MCG/2ML IJ SOLN
INTRAMUSCULAR | Status: DC | PRN
  Administered 2019-04-30 (×2): 50 ug via INTRAVENOUS

## 2019-04-30 MED ORDER — LIDOCAINE 2% (20 MG/ML) 5 ML SYRINGE
INTRAMUSCULAR | Status: DC | PRN
  Administered 2019-04-30: 20 mg via INTRAVENOUS

## 2019-04-30 MED ORDER — BACITRACIN ZINC 500 UNIT/GM EX OINT
TOPICAL_OINTMENT | CUTANEOUS | Status: DC | PRN
  Administered 2019-04-30: 1 via TOPICAL

## 2019-04-30 MED ORDER — VERAPAMIL HCL 2.5 MG/ML IV SOLN
INTRAVENOUS | Status: AC
  Filled 2019-04-30: qty 2

## 2019-04-30 MED ORDER — ACETAMINOPHEN 325 MG PO TABS
650.0000 mg | ORAL_TABLET | ORAL | Status: DC | PRN
  Filled 2019-04-30: qty 2

## 2019-04-30 MED ORDER — PROMETHAZINE HCL 25 MG/ML IJ SOLN
6.2500 mg | INTRAMUSCULAR | Status: DC | PRN
  Administered 2019-04-30: 12.5 mg via INTRAVENOUS

## 2019-04-30 MED ORDER — TICAGRELOR 90 MG PO TABS
ORAL_TABLET | ORAL | Status: AC
  Filled 2019-04-30: qty 2

## 2019-04-30 MED ORDER — PHENYLEPHRINE HCL (PRESSORS) 10 MG/ML IV SOLN
INTRAVENOUS | Status: DC | PRN
  Administered 2019-04-30: 80 ug via INTRAVENOUS
  Administered 2019-04-30: 120 ug via INTRAVENOUS

## 2019-04-30 MED ORDER — ROCURONIUM BROMIDE 50 MG/5ML IV SOSY
PREFILLED_SYRINGE | INTRAVENOUS | Status: DC | PRN
  Administered 2019-04-30: 40 mg via INTRAVENOUS

## 2019-04-30 MED ORDER — SODIUM CHLORIDE 0.9% FLUSH
3.0000 mL | Freq: Two times a day (BID) | INTRAVENOUS | Status: DC
  Administered 2019-04-30 – 2019-05-01 (×2): 3 mL via INTRAVENOUS

## 2019-04-30 MED ORDER — HEPARIN (PORCINE) IN NACL 1000-0.9 UT/500ML-% IV SOLN
INTRAVENOUS | Status: AC
  Filled 2019-04-30: qty 1000

## 2019-04-30 MED ORDER — FENTANYL CITRATE (PF) 100 MCG/2ML IJ SOLN
100.0000 ug | Freq: Once | INTRAMUSCULAR | Status: AC
  Administered 2019-04-30: 100 ug via INTRAVENOUS

## 2019-04-30 MED ORDER — SODIUM CHLORIDE 0.9% FLUSH
3.0000 mL | Freq: Once | INTRAVENOUS | Status: AC
  Administered 2019-04-30: 3 mL via INTRAVENOUS

## 2019-04-30 MED ORDER — FENTANYL CITRATE (PF) 100 MCG/2ML IJ SOLN
INTRAMUSCULAR | Status: AC
  Filled 2019-04-30: qty 2

## 2019-04-30 MED ORDER — LIDOCAINE HCL (PF) 1 % IJ SOLN
INTRAMUSCULAR | Status: DC | PRN
  Administered 2019-04-30: 4 mL

## 2019-04-30 MED ORDER — MIDAZOLAM HCL 2 MG/2ML IJ SOLN
INTRAMUSCULAR | Status: AC
  Filled 2019-04-30: qty 2

## 2019-04-30 MED ORDER — NITROGLYCERIN IN D5W 200-5 MCG/ML-% IV SOLN
0.0000 ug/min | INTRAVENOUS | Status: DC
  Administered 2019-04-30: 04:00:00 3 ug/min via INTRAVENOUS
  Filled 2019-04-30: qty 250

## 2019-04-30 MED ORDER — NITROGLYCERIN 0.4 MG SL SUBL: 0.4000 mg | SUBLINGUAL_TABLET | SUBLINGUAL | Status: DC | PRN

## 2019-04-30 MED ORDER — HEPARIN (PORCINE) 25000 UT/250ML-% IV SOLN
1400.0000 [IU]/h | INTRAVENOUS | Status: DC
  Administered 2019-04-30: 1400 [IU]/h via INTRAVENOUS
  Filled 2019-04-30: qty 250

## 2019-04-30 SURGICAL SUPPLY — 23 items
BALLN SAPPHIRE ~~LOC~~ 4.0X15 (BALLOONS) ×2 IMPLANT
BALLN WOLVERINE 3.00X10 (BALLOONS) ×2
BALLOON WOLVERINE 3.00X10 (BALLOONS) ×1 IMPLANT
CATH INFINITI 5 FR IM (CATHETERS) ×2 IMPLANT
CATH INFINITI JR4 5F (CATHETERS) ×2 IMPLANT
CATH LAUNCHER 6FR AL1 (CATHETERS) ×1 IMPLANT
CATH LAUNCHER 6FR JR4 (CATHETERS) ×2 IMPLANT
CATH OPTITORQUE TIG 4.0 5F (CATHETERS) ×2 IMPLANT
CATHETER LAUNCHER 6FR AL1 (CATHETERS) ×2
DEVICE RAD COMP TR BAND LRG (VASCULAR PRODUCTS) ×4 IMPLANT
DEVICE SPIDERFX EMB PROT 5MM (WIRE) ×2 IMPLANT
ELECT DEFIB PAD ADLT CADENCE (PAD) ×2 IMPLANT
GLIDESHEATH SLEND SS 6F .021 (SHEATH) ×2 IMPLANT
GUIDEWIRE INQWIRE 1.5J.035X260 (WIRE) ×1 IMPLANT
INQWIRE 1.5J .035X260CM (WIRE) ×2
KIT ENCORE 26 ADVANTAGE (KITS) ×2 IMPLANT
KIT HEART LEFT (KITS) ×2 IMPLANT
PACK CARDIAC CATHETERIZATION (CUSTOM PROCEDURE TRAY) ×2 IMPLANT
SHEATH PROBE COVER 6X72 (BAG) ×2 IMPLANT
STENT SYNERGY DES 3.5X20 (Permanent Stent) ×2 IMPLANT
TRANSDUCER W/STOPCOCK (MISCELLANEOUS) ×2 IMPLANT
TUBING CIL FLEX 10 FLL-RA (TUBING) ×2 IMPLANT
WIRE ASAHI PROWATER 180CM (WIRE) ×4 IMPLANT

## 2019-04-30 SURGICAL SUPPLY — 50 items
BAG DECANTER FOR FLEXI CONT (MISCELLANEOUS) ×3 IMPLANT
BANDAGE COBAN STERILE 2 (GAUZE/BANDAGES/DRESSINGS) IMPLANT
BLADE SURG 15 STRL LF DISP TIS (BLADE) ×1 IMPLANT
BLADE SURG 15 STRL SS (BLADE) ×2
BNDG COHESIVE 4X5 TAN STRL (GAUZE/BANDAGES/DRESSINGS) ×3 IMPLANT
BNDG ESMARK 4X9 LF (GAUZE/BANDAGES/DRESSINGS) ×3 IMPLANT
BNDG GAUZE ELAST 4 BULKY (GAUZE/BANDAGES/DRESSINGS) ×6 IMPLANT
BRUSH SCRUB EZ PLAIN DRY (MISCELLANEOUS) IMPLANT
CANISTER SUCT 3000ML PPV (MISCELLANEOUS) ×3 IMPLANT
CHLORAPREP W/TINT 26ML (MISCELLANEOUS) ×3 IMPLANT
CORDS BIPOLAR (ELECTRODE) ×3 IMPLANT
COVER SURGICAL LIGHT HANDLE (MISCELLANEOUS) ×3 IMPLANT
COVER WAND RF STERILE (DRAPES) ×3 IMPLANT
CUFF TOURNIQUET SINGLE 18IN (TOURNIQUET CUFF) ×3 IMPLANT
CUFF TOURNIQUET SINGLE 24IN (TOURNIQUET CUFF) IMPLANT
DRAPE SURG 17X23 STRL (DRAPES) ×3 IMPLANT
DRSG ADAPTIC 3X8 NADH LF (GAUZE/BANDAGES/DRESSINGS) ×3 IMPLANT
DRSG EMULSION OIL 3X3 NADH (GAUZE/BANDAGES/DRESSINGS) IMPLANT
GAUZE SPONGE 4X4 12PLY STRL (GAUZE/BANDAGES/DRESSINGS) ×3 IMPLANT
GLOVE BIO SURGEON STRL SZ 6.5 (GLOVE) ×2 IMPLANT
GLOVE BIO SURGEON STRL SZ7.5 (GLOVE) ×3 IMPLANT
GLOVE BIO SURGEONS STRL SZ 6.5 (GLOVE) ×1
GLOVE BIOGEL PI IND STRL 7.0 (GLOVE) ×1 IMPLANT
GLOVE BIOGEL PI IND STRL 8 (GLOVE) ×1 IMPLANT
GLOVE BIOGEL PI INDICATOR 7.0 (GLOVE) ×2
GLOVE BIOGEL PI INDICATOR 8 (GLOVE) ×2
GOWN STRL REUS W/ TWL XL LVL3 (GOWN DISPOSABLE) ×2 IMPLANT
GOWN STRL REUS W/TWL XL LVL3 (GOWN DISPOSABLE) ×4
KIT BASIN OR (CUSTOM PROCEDURE TRAY) ×3 IMPLANT
NEEDLE HYPO 25X1 1.5 SAFETY (NEEDLE) IMPLANT
NS IRRIG 1000ML POUR BTL (IV SOLUTION) ×3 IMPLANT
PACK ORTHO EXTREMITY (CUSTOM PROCEDURE TRAY) ×3 IMPLANT
PAD CAST 4YDX4 CTTN HI CHSV (CAST SUPPLIES) ×1 IMPLANT
PADDING CAST ABS 4INX4YD NS (CAST SUPPLIES) ×2
PADDING CAST ABS COTTON 4X4 ST (CAST SUPPLIES) ×1 IMPLANT
PADDING CAST COTTON 4X4 STRL (CAST SUPPLIES) ×2
SPONGE LAP 18X18 RF (DISPOSABLE) ×3 IMPLANT
SUCTION FRAZIER TIP 8 FR DISP (SUCTIONS) ×2
SUCTION TUBE FRAZIER 8FR DISP (SUCTIONS) ×1 IMPLANT
SUT ETHILON 2 0 FS 18 (SUTURE) ×9 IMPLANT
SUT ETHILON 8 0 BV130 4 (SUTURE) ×6 IMPLANT
SUT VIC AB 2-0 CT3 27 (SUTURE) IMPLANT
SUT VICRYL 4-0 PS2 18IN ABS (SUTURE) IMPLANT
SUT VICRYL RAPIDE 4/0 PS 2 (SUTURE) ×3 IMPLANT
SYR 10ML LL (SYRINGE) IMPLANT
TOWEL OR 17X24 6PK STRL BLUE (TOWEL DISPOSABLE) ×6 IMPLANT
TUBE CONNECTING 12'X1/4 (SUCTIONS) ×1
TUBE CONNECTING 12X1/4 (SUCTIONS) ×2 IMPLANT
UNDERPAD 30X30 (UNDERPADS AND DIAPERS) ×3 IMPLANT
YANKAUER SUCT BULB TIP NO VENT (SUCTIONS) ×3 IMPLANT

## 2019-04-30 NOTE — Interval H&P Note (Signed)
History and Physical Interval Note:  04/30/2019 12:21 PM  Orva Gwaltney  has presented today for surgery, with the diagnosis of chest pain / unstable angina (with minimal Troponin Elevation).  The various methods of treatment have been discussed with the patient and family. After consideration of risks, benefits and other options for treatment, the patient has consented to  Procedure(s): LEFT HEART CATH AND CORS/GRAFTS ANGIOGRAPHY (N/A) with possible PERCUTANEOUS CORONARY INTERVENTION as a surgical intervention.  The patient's history has been reviewed, patient examined, no change in status, stable for surgery.  I have reviewed the patient's chart and labs.  Questions were answered to the patient's satisfaction.    Cath Lab Visit (complete for each Cath Lab visit)  Clinical Evaluation Leading to the Procedure:   ACS: Yes.    Non-ACS:    Anginal Classification: CCS IV  Anti-ischemic medical therapy: Maximal Therapy (2 or more classes of medications)  Non-Invasive Test Results: No non-invasive testing performed  Prior CABG: Previous CABG    Glenetta Hew

## 2019-04-30 NOTE — ED Notes (Signed)
Pt c/o worsened headache and nausea, refusing zofran and tylenol at this time, pt reports the only thing that will make him feel better is to eat, it has been 18 hours since he has eaten. CBG to be obtained. Pt aware his is NPO and understands.

## 2019-04-30 NOTE — H&P (View-Only) (Signed)
Progress Note  Patient Name: Matthew Moses Date of Encounter: 04/30/2019  Primary Cardiologist: Quay Burow, MD   Subjective   The patient continues to have 5/10 retrosternal chest pain radiating to his arm as well as SOB. It started on Friday night and persisted on and off all weekend.  Inpatient Medications    Scheduled Meds: . [START ON 05/01/2019] aspirin EC  81 mg Oral Daily  . atorvastatin  80 mg Oral q1800  . metoprolol tartrate  6.25 mg Oral Q6H  . ticagrelor  90 mg Oral BID   Continuous Infusions: . heparin 1,400 Units/hr (04/30/19 0336)  . nitroGLYCERIN 10 mcg/min (04/30/19 0757)   PRN Meds: acetaminophen, nitroGLYCERIN, ondansetron (ZOFRAN) IV   Vital Signs    Vitals:   04/30/19 0345 04/30/19 0400 04/30/19 0730 04/30/19 0800  BP: 135/76 125/84 113/75 112/69  Pulse: 80 73 (!) 58 66  Resp: (!) 25  20 20   Temp:      TempSrc:      SpO2: 98% 95% 98% 98%  Weight:      Height:        Intake/Output Summary (Last 24 hours) at 04/30/2019 0841 Last data filed at 04/30/2019 0338 Gross per 24 hour  Intake -  Output 300 ml  Net -300 ml   Last 3 Weights 04/30/2019 12/21/2018 12/21/2018  Weight (lbs) 240 lb 238 lb 1.6 oz 238 lb 1.6 oz  Weight (kg) 108.863 kg 108 kg 108 kg      Telemetry    SR - Personally Reviewed  ECG    SR, non-specificic ST T wave abnormalities, unchanged from prior - Personally Reviewed  Physical Exam  In mild distress sec to chest pain GEN: obese Neck: No JVD Cardiac: RRR, no murmurs, rubs, or gallops.  Respiratory: Clear to auscultation bilaterally. GI: Soft, nontender, non-distended  MS: No edema; No deformity. Neuro:  Nonfocal  Psych: Normal affect   Labs    Chemistry Recent Labs  Lab 04/30/19 0143  NA 136  K 3.9  CL 98  CO2 23  GLUCOSE 252*  BUN 19  CREATININE 0.99  CALCIUM 9.3  GFRNONAA >60  GFRAA >60  ANIONGAP 15     Hematology Recent Labs  Lab 04/30/19 0143  WBC 10.0  RBC 5.50  HGB 15.9  HCT 48.0  MCV  87.3  MCH 28.9  MCHC 33.1  RDW 13.1  PLT 231    Cardiac Enzymes Recent Labs  Lab 04/30/19 0143  TROPONINI 0.15*   No results for input(s): TROPIPOC in the last 168 hours.   BNPNo results for input(s): BNP, PROBNP in the last 168 hours.   DDimer No results for input(s): DDIMER in the last 168 hours.   Radiology    Dg Chest 2 View  Result Date: 04/30/2019 CLINICAL DATA:  68 year old male with progressive chest pain. Smoker. EXAM: CHEST - 2 VIEW COMPARISON:  04/28/2019 and earlier. FINDINGS: Prior CABG. Mediastinal contours remain within normal limits. Stable lung volumes. Visualized tracheal air column is within normal limits. No pneumothorax, pulmonary edema, pleural effusion or confluent pulmonary opacity. Chronic increased interstitial markings. No acute osseous abnormality identified. Paucity of bowel gas in the upper abdomen. IMPRESSION: No acute cardiopulmonary abnormality. Electronically Signed   By: Genevie Ann M.D.   On: 04/30/2019 02:00   Cardiac Studies    Patient Profile     68 y.o. male w/ history of HTN, CVA, smoking, DM2, HLD, obesity, multiple MI's, VT arrest, and severe multivessel CAD s/p CABG  in 2012 who presents with NSTEMI. He continues to have significant chest pain and has not been able to tolerate much nitroglycerin due to borderline blood pressures.   Assessment & Plan    #) NSTEMI - NPO for cath in AM, continue heparin and NTG drip - troponin 0.15 - ASA 324mg  then 81mg  daily (notably patient initially refused aspirin because his "blood is too thin already" but eventually agreed after explanation of why this is necessary) -- atorvastatin 80mg  QHS - ordered for home valsartan but does not take this per pharmacy med rec - PRN fentanyl for chest pain refractory to nitro - continue ticagrelor 90mg  BID, will likely re-load in cath lab - would benefit from beta blocker for chronic angina, will stop amiodarone (as was suggested by Dr. in August 2019) and  start beta blocker at this time - cardiac rehab - smoking cessation counseling  #) DM - hold metformin this AM for cath - supposed to be on jardiance but does not take this per pharmacy med rec  #) History of VT arrest: - stop amiodarone as per above, start metoprolol 6.25mg  q6h in hospital to monitor for ability to tolerate with borderline blood pressure  #) Medication adherence: patient not taking multiple prescribed therapies (statin, jardiance, valsartan) at home, although he does take ticagrelor regularly. Would benefit from meeting with pharmacist or other care team member to reinforce the importance of adherence to his prescribed medication regimen to reduce his risk for future cardiovascular events.   The patient was admitted by an overnight fellow, however required further attention for critical decision making and therapies, total time spent with the patient 35 minutes.  For questions or updates, please contact [facility] Please consult [email or web address] for contact info under     Signed, , MD  04/30/2019, 8:41 AM

## 2019-04-30 NOTE — Transfer of Care (Signed)
Immediate Anesthesia Transfer of Care Note  Patient: Matthew Moses  Procedure(s) Performed: FASCIOTOMY AND  ARTERY REPAIR (Left Arm Lower)  Patient Location: PACU  Anesthesia Type:General  Level of Consciousness: awake, drowsy and patient cooperative  Airway & Oxygen Therapy: Patient Spontanous Breathing  Post-op Assessment: Report given to RN and Post -op Vital signs reviewed and stable  Post vital signs: Reviewed and stable  Last Vitals:  Vitals Value Taken Time  BP 107/51 04/30/2019  8:06 PM  Temp    Pulse 63 04/30/2019  8:06 PM  Resp 14 04/30/2019  8:06 PM  SpO2 95 % 04/30/2019  8:06 PM  Vitals shown include unvalidated device data.  Last Pain:  Vitals:   04/30/19 1454  TempSrc:   PainSc: 0-No pain         Complications: No apparent anesthesia complications

## 2019-04-30 NOTE — Consult Note (Addendum)
Hospital Consult    Reason for Consult:  Left forearm hematoma after radial access Referring Physician:  Cardiology MRN #:  [id number]  History of Present Illness: This is a 68 y.o. male with history of hypertension, CVA, smoking, diabetes, hyperlipidemia, obesity, multilevel coronary artery disease status post CABG who presented as an NSTEMI and vascular surgery was consulted for forearm hematoma following radial access for cath today.  Patient is seen in recovery and is complaining of 10 out of 10 left forearm pain.  He states he cannot move his fingers.  He states his fingers are insensate.  Most of all the pain is in the forearm.  Has a TR band in place.  States this is all new and started after cath.  Past Medical History:  Diagnosis Date  . CAD, multiple vessel    Severe native CAD involving left main, LAD, RCA, ramus intermedius and PDA -> status post CABG x2 (LIMA-LAD, SVG-L PDA)  . Coronary artery disease involving coronary bypass graft of native heart with angina pectoris (HCC) - thrombotic 95% oclusion of SVG-LPDA 04/03/2018   occluded LAD, subtotally occluded nondominant to codominant RCA, LCx & RI.  LIMA-LAD was intact. CULPRIT LESION was the SVG-dLPLA that had a high-grade proximal thrombotic stenosis.  S/P successful PCI of SVG-LPDA stented his vein graft with a 3.5 mm drug-eluting stent using distal protection.  [name] Kitchen. Hx of CABG   . Hyperlipidemia associated with type 2 diabetes mellitus (HCC)   . Hypertension   . Tobacco abuse   . Type II diabetes mellitus with complication (HCC)    History of stroke and CAD-CABG    Past Surgical History:  Procedure Laterality Date  . CORONARY ARTERY BYPASS GRAFT    . CORONARY/GRAFT ACUTE MI REVASCULARIZATION N/A 04/02/2018   Procedure: Coronary/Graft Acute MI Revascularization;  Surgeon: Runell GessBerry, Jonathan J, MD;  [facility];  Service: Cardiovascular: Thrombotic 95% SVG-L WUJ:WJXBPDA:PTCA - thrombectomy -> DES PCI Synergy DES 3.5 x 20  (3.9 mm)  . KNEE SURGERY    . LEFT HEART CATH AND CORS/GRAFTS ANGIOGRAPHY N/A 04/02/2018   Procedure: LEFT HEART CATH AND CORS/GRAFTS ANGIOGRAPHY;  Surgeon: Runell GessBerry, Jonathan J, MD;  Location: [facility];  Service: Cardiovascular: Ostial Left Main 50%; distal Left Main-pLAD 100% - d(apical)LAD 95 (after patent LIMA-LAD).OstRI - 99%; ost-mLCx 95%, dCx 95% & OM2 95%; ost-pRCA (co-dominant) 99%mRCA 80%; SVG-LPDA 99% ostial thrombosis; EF 30% with a EDP 31 mmHg  . MUSCLE REPAIR    . TONSILLECTOMY      Allergies  Allergen Reactions  . Penicillins Itching and Rash    Has patient had a PCN reaction causing immediate rash, facial/tongue/throat swelling, SOB or lightheadedness with hypotension: Yes Has patient had a PCN reaction causing severe rash involving mucus membranes or skin necrosis: No Has patient had a PCN reaction that required hospitalization: No Has patient had a PCN reaction occurring within the last 10 years: No If all of the above answers are "NO", then may proceed with Cephalosporin use.  . Codeine Hives    Prior to Admission medications   Medication Sig Start Date End Date Taking? Authorizing Provider  amiodarone (PACERONE) 200 MG tablet Take 100 mg by mouth daily.   Yes [provider]  BRILINTA 90 MG TABS tablet TAKE 1 TABLET(90 MG TOTAL) BY MOUTH TWICE DAILY Patient taking differently: Take 90 mg by mouth 2 (two) times daily.  03/20/19  Yes Runell GessBerry, Jonathan J, MD  Lancets [facility](ONETOUCH ULTRASOFT) lancets Use as instructed  07/05/16  Yes Layne Benton, NP  metFORMIN (GLUCOPHAGE) 500 MG tablet Take 1 tablet (500 mg total) by mouth 2 (two) times daily with a meal. 04/11/18  Yes Kilroy, Luke K, PA-C  nitroGLYCERIN (NITROSTAT) 0.4 MG SL tablet Place 1 tablet (0.4 mg total) under the tongue every 5 (five) minutes as needed. Patient taking differently: Place 0.4 mg under the tongue every 5 (five) minutes as needed for chest pain.  04/05/18  Yes Arty Baumgartner, NP   atorvastatin (LIPITOR) 80 MG tablet Take 1 tablet (80 mg total) by mouth daily at 6 PM. Patient not taking: Reported on 12/21/2018 04/05/18   Laverda Page B, NP  empagliflozin (JARDIANCE) 10 MG TABS tablet Take 10 mg by mouth daily. Patient not taking: Reported on 12/21/2018 04/05/18   Laverda Page B, NP  valsartan (DIOVAN) 40 MG tablet Take 1 tablet (40 mg total) by mouth daily. Patient not taking: Reported on 12/21/2018 04/05/18 12/21/18  Arty Baumgartner, NP    Social History   Socioeconomic History  . Marital status: Single    Spouse name: Not on file  . Number of children: Not on file  . Years of education: Not on file  . Highest education level: Not on file  Occupational History  . Not on file  Social Needs  . Financial resource strain: Not on file  . Food insecurity:    Worry: Not on file    Inability: Not on file  . Transportation needs:    Medical: Not on file    Non-medical: Not on file  Tobacco Use  . Smoking status: Current Every Day Smoker  . Smokeless tobacco: Never Used  Substance and Sexual Activity  . Alcohol use: No  . Drug use: Yes    Types: Marijuana  . Sexual activity: Not on file  Lifestyle  . Physical activity:    Days per week: Not on file    Minutes per session: Not on file  . Stress: Not on file  Relationships  . Social connections:    Talks on phone: Not on file    Gets together: Not on file    Attends religious service: Not on file    Active member of club or organization: Not on file    Attends meetings of clubs or organizations: Not on file    Relationship status: Not on file  . Intimate partner violence:    Fear of current or ex partner: Not on file    Emotionally abused: Not on file    Physically abused: Not on file    Forced sexual activity: Not on file  Other Topics Concern  . Not on file  Social History Narrative  . Not on file     Family History  Problem Relation Age of Onset  . CAD Mother        CABG 60's, died  57's    ROS: [x]  Positive   [ ]  Negative   [ ]  All sytems reviewed and are negative  Cardiovascular: []  chest pain/pressure []  palpitations []  SOB lying flat []  DOE []  pain in legs while walking []  pain in legs at rest []  pain in legs at night []  non-healing ulcers []  hx of DVT []  swelling in legs  Pulmonary: []  productive cough []  asthma/wheezing []  home O2  Neurologic: [x]  weakness in []  arms []  legs (right hand) [x]  numbness in []  arms []  legs (right hand) []  hx of CVA []  mini stroke [] difficulty speaking or slurred  speech []  temporary loss of vision in one eye []  dizziness  Hematologic: []  hx of cancer []  bleeding problems []  problems with blood clotting easily  Endocrine:   []  diabetes []  thyroid disease  GI []  vomiting blood []  blood in stool  GU: []  CKD/renal failure []  HD--[]  M/W/F or []  T/T/S []  burning with urination []  blood in urine  Psychiatric: []  anxiety []  depression  Musculoskeletal: []  arthritis []  joint pain  Integumentary: []  rashes []  ulcers  Constitutional: []  fever []  chills   Physical Examination  Vitals:   04/30/19 1455 04/30/19 1510  BP: 126/67 123/84  Pulse: 74 73  Resp: 17 16  Temp:    SpO2: 97% 99%   Body mass index is 31.66 kg/m.  General:  Mild distress Gait: Not observed HENT: WNL, normocephalic Pulmonary: normal non-labored breathing, without Rales, rhonchi,  wheezing Abdomen:  soft, NT/ND, no masses Vascular Exam/Pulses: Right radial 2+ palpable Left radial and ulnar multiphasic signal, palmar arch signal Left forearm very tense throughout Extremities: No sensation or motor in right hand, TR band in place Musculoskeletal: no muscle wasting or atrophy  Neurologic: A&O X 3   CBC    Component Value Date/Time   WBC 10.0 04/30/2019 0143   RBC 5.50 04/30/2019 0143   HGB 15.9 04/30/2019 0143   HCT 48.0 04/30/2019 0143   PLT 231 04/30/2019 0143   MCV 87.3 04/30/2019 0143   MCH 28.9 04/30/2019  0143   MCHC 33.1 04/30/2019 0143   RDW 13.1 04/30/2019 0143   LYMPHSABS 3.0 12/21/2018 1327   MONOABS 0.8 12/21/2018 1327   EOSABS 0.3 12/21/2018 1327   BASOSABS 0.1 12/21/2018 1327    BMET    Component Value Date/Time   NA 136 04/30/2019 0143   K 3.9 04/30/2019 0143   CL 98 04/30/2019 0143   CO2 23 04/30/2019 0143   GLUCOSE 252 (H) 04/30/2019 0143   BUN 19 04/30/2019 0143   CREATININE 0.99 04/30/2019 0143   CALCIUM 9.3 04/30/2019 0143   GFRNONAA >60 04/30/2019 0143   GFRAA >60 04/30/2019 0143    COAGS: Lab Results  Component Value Date   INR 0.88 12/19/2018   INR 0.95 04/02/2018   INR 0.99 07/02/2016     Non-Invasive Vascular Imaging:    None   ASSESSMENT/PLAN: This is a 68 y.o. male with multiple medical problems as previously documented that presents with complication from left radial access for heart cath and now with left forearm compartment syndrome.  I discussed with Dr. Ellyn Hack that he should consult hand surgery for upper extremity fasciotomies.  Vascular surgery is certainly available to assist with any vascular repair that may be needed.  Suspect that he has been bleeding around the access site in the forearm.  Marty Heck, MD [facility] Office: [phone] Pager: [phone]

## 2019-04-30 NOTE — Anesthesia Procedure Notes (Signed)
Procedure Name: Intubation Date/Time: 04/30/2019 6:47 PM Performed by: Oletta Lamas, CRNA Pre-anesthesia Checklist: Patient identified, Emergency Drugs available, Suction available and Patient being monitored Patient Re-evaluated:Patient Re-evaluated prior to induction Oxygen Delivery Method: Circle System Utilized Preoxygenation: Pre-oxygenation with 100% oxygen Induction Type: IV induction, Rapid sequence and Cricoid Pressure applied Laryngoscope Size: Miller and 3 Grade View: Grade I Tube type: Oral Number of attempts: 1 Airway Equipment and Method: Stylet Placement Confirmation: ETT inserted through vocal cords under direct vision,  positive ETCO2 and breath sounds checked- equal and bilateral Secured at: 23 cm Tube secured with: Tape Dental Injury: Teeth and Oropharynx as per pre-operative assessment

## 2019-04-30 NOTE — H&P (View-Only) (Signed)
ORTHOPAEDIC CONSULTATION HISTORY & PHYSICAL REQUESTING PHYSICIAN: Lars Masson, MD  Chief Complaint: left forearm swelling/pain  HPI: Matthew Moses is a 68 y.o. male who underwent cardiac catheterization via left radial artery access earlier today.  Post procedurally, was noted to have hematoma, which has continued to expand, now with fairly intense pain, coolness of the hand, numbness in the fingers, and tight forearm.  Past Medical History:  Diagnosis Date   CAD, multiple vessel    Severe native CAD involving left main, LAD, RCA, ramus intermedius and PDA -> status post CABG x2 (LIMA-LAD, SVG-L PDA)   Coronary artery disease involving coronary bypass graft of native heart with angina pectoris (HCC) - thrombotic 95% oclusion of SVG-LPDA 04/03/2018   occluded LAD, subtotally occluded nondominant to codominant RCA, LCx & RI.  LIMA-LAD was intact. CULPRIT LESION was the SVG-dLPLA that had a high-grade proximal thrombotic stenosis.  S/P successful PCI of SVG-LPDA stented his vein graft with a 3.5 mm drug-eluting stent using distal protection.   Hx of CABG    Hyperlipidemia associated with type 2 diabetes mellitus (HCC)    Hypertension    Tobacco abuse    Type II diabetes mellitus with complication (HCC)    History of stroke and CAD-CABG   Past Surgical History:  Procedure Laterality Date   CORONARY ARTERY BYPASS GRAFT     CORONARY/GRAFT ACUTE MI REVASCULARIZATION N/A 04/02/2018   Procedure: Coronary/Graft Acute MI Revascularization;  Surgeon: [name], MD;  Location: [facility];  Service: Cardiovascular: Thrombotic 95% SVG-L O[facility] - thrombectomy -> DES PCI Synergy DES 3.5 x 20 (3.9 mm)   KNEE SURGERY     LEFT HEART CATH AND CORS/GRAFTS ANGIOGRAPHY N/A 04/02/2018   Procedure: LEFT HEART CATH AND CORS/GRAFTS ANGIOGRAPHY;  Surgeon: [name], MD;  Location: [facility];  Service: Cardiovascular: Ostial Left Main 50%; distal Left Main-pLAD 100% -  d(apical)LAD 95 (after patent LIMA-LAD).OstRI - 99%; ost-mLCx 95%, dCx 95% & OM2 95%; ost-pRCA (co-dominant) 99%mRCA 80%; SVG-LPDA 99% ostial thrombosis; EF 30% with a EDP 31 mmHg   MUSCLE REPAIR     TONSILLECTOMY     Social History   Socioeconomic History   Marital status: Single    Spouse name: Not on file   Number of children: Not on file   Years of education: Not on file   Highest education level: Not on file  Occupational History   Not on file  Social Needs   Financial resource strain: Not on file   Food insecurity:    Worry: Not on file    Inability: Not on file   Transportation needs:    Medical: Not on file    Non-medical: Not on file  Tobacco Use   Smoking status: Current Every Day Smoker   Smokeless tobacco: Never Used  Substance and Sexual Activity   Alcohol use: No   Drug use: Yes    Types: Marijuana   Sexual activity: Not on file  Lifestyle   Physical activity:    Days per week: Not on file    Minutes per session: Not on file   Stress: Not on file  Relationships   Social connections:    Talks on phone: Not on file    Gets together: Not on file    Attends religious service: Not on file    Active member of club or organization: Not on file    Attends meetings of clubs or organizations: Not on file  Relationship status: Not on file  Other Topics Concern   Not on file  Social History Narrative   Not on file   Family History  Problem Relation Age of Onset   CAD Mother        CABG 24's, died 110's   Allergies  Allergen Reactions   Penicillins Itching and Rash    Has patient had a PCN reaction causing immediate rash, facial/tongue/throat swelling, SOB or lightheadedness with hypotension: Yes Has patient had a PCN reaction causing severe rash involving mucus membranes or skin necrosis: No Has patient had a PCN reaction that required hospitalization: No Has patient had a PCN reaction occurring within the last 10 years: No If all  of the above answers are "NO", then may proceed with Cephalosporin use.   Codeine Hives   Prior to Admission medications   Medication Sig Start Date End Date Taking? Authorizing Provider  amiodarone (PACERONE) 200 MG tablet Take 100 mg by mouth daily.   Yes [provider]  BRILINTA 90 MG TABS tablet TAKE 1 TABLET(90 MG TOTAL) BY MOUTH TWICE DAILY Patient taking differently: Take 90 mg by mouth 2 (two) times daily.  03/20/19  Yes Lorretta Harp, MD  Lancets [facility] ULTRASOFT) lancets Use as instructed 07/05/16  Yes Donzetta Starch, NP  metFORMIN (GLUCOPHAGE) 500 MG tablet Take 1 tablet (500 mg total) by mouth 2 (two) times daily with a meal. 04/11/18  Yes Kilroy, Luke K, PA-C  nitroGLYCERIN (NITROSTAT) 0.4 MG SL tablet Place 1 tablet (0.4 mg total) under the tongue every 5 (five) minutes as needed. Patient taking differently: Place 0.4 mg under the tongue every 5 (five) minutes as needed for chest pain.  04/05/18  Yes Cheryln Manly, NP  atorvastatin (LIPITOR) 80 MG tablet Take 1 tablet (80 mg total) by mouth daily at 6 PM. Patient not taking: Reported on 12/21/2018 04/05/18   Reino Bellis B, NP  empagliflozin (JARDIANCE) 10 MG TABS tablet Take 10 mg by mouth daily. Patient not taking: Reported on 12/21/2018 04/05/18   Reino Bellis B, NP  valsartan (DIOVAN) 40 MG tablet Take 1 tablet (40 mg total) by mouth daily. Patient not taking: Reported on 12/21/2018 04/05/18 12/21/18  Cheryln Manly, NP   Dg Chest 2 View  Result Date: 04/30/2019 CLINICAL DATA:  68 year old male with progressive chest pain. Smoker. EXAM: CHEST - 2 VIEW COMPARISON:  04/28/2019 and earlier. FINDINGS: Prior CABG. Mediastinal contours remain within normal limits. Stable lung volumes. Visualized tracheal air column is within normal limits. No pneumothorax, pulmonary edema, pleural effusion or confluent pulmonary opacity. Chronic increased interstitial markings. No acute osseous abnormality identified. Paucity  of bowel gas in the upper abdomen. IMPRESSION: No acute cardiopulmonary abnormality. Electronically Signed   By: Genevie Ann M.D.   On: 04/30/2019 02:00    Positive ROS: All other systems have been reviewed and were otherwise negative with the exception of those mentioned in the HPI and as above.  Physical Exam: Vitals: Refer to EMR. Constitutional:  WD, WN, NAD HEENT:  NCAT, EOMI Neuro/Psych:  Alert & oriented to person, place, and time; appropriate mood & affect Lymphatic: No generalized extremity edema or lymphadenopathy Extremities / MSK:  The extremities are normal with respect to appearance, ranges of motion, joint stability, muscle strength/tone, sensation, & perfusion except as otherwise noted:  Left forearm is swollen and tight, particularly volar compartment.  All of the digits are clenched, there is pain with passive extension, the hand is cool compared to  the contralateral, the radial pulses palpable and ulnar pulse is not.  Intact light touch sensibility in the radial, median, and ulnar distributions, with intact motor to the same, but with subjective paresthesias throughout all digits. ° °Assessment: °Developing left upper extremity compartment syndrome following radial artery access for cardiac procedure ° °Plan: °I discussed these findings with him and the indications for surgical exploration, with decompressive fasciotomy and possible arterial repair.  Goals, risk, options and indications were reviewed and consent obtained.  Depending on the degree of intraoperative swelling, wound will likely not be completely closed primarily, but we have discussed returning to the operating room semi-electively at some point in the future for delayed primary closure. ° °Keiran Gaffey A. [place] Foerster, MD      °Orthopaedic & Hand Surgery °[facility] °[address] °[place], [place]  [address] °Office: [phone] °Mobile: [phone] ° °04/30/2019, 6:32 PM  ° °

## 2019-04-30 NOTE — Progress Notes (Addendum)
Received pt from cath lab into the HA with BP cuff on left lower arm above TR Band. Left arm elevated , BP set at 83mm and  slowly deflated and removed at 1530 after being on for 30 mins. Pt very restless , refuse any assistance and continue to move around in bed after told repeatedly to keep his arm elevated.    Dr Ellyn Hack in to re-evaluate left arm and wrist site. Arm soft but remain swollen, tender to touch  underneath arm.  BP re-applied at 60 mm and deflated to 40 after 15 mins and 53mm in another 15 mins.  Order were given to Morphine 2 mg IV for arm discomfort and to monitor pt in the HA until rechecked again by Dr Ellyn Hack.

## 2019-04-30 NOTE — H&P (Addendum)
CARDIOLOGY H&P  HPI: Matthew Moses is a 68 y.o. male w/ history of HTN, CVA, smoking, DM2, HLD, obesity, multiple MI's, VT arrest, and severe multivessel CAD s/p CABG in 2012 who presents with NSTEMI.   Patient has long history of severe multivessel CAD as noted above. Over the past year he describes nearly daily chest pain both at rest and with exertion. This has been significantly limiting to his lifestyle. His pain is dull aching pressure located at the central chest with radiation to the left arm. He was scheduled for cath in January 2020 for escalating chest pain but the patient left the hospital AMA prior to undergoing the procedure.   Patient now returns with worsening chest pain since midnight. His symptoms started acutely and woke him from sleep. He was given 3 SLN tablets by EMS at home with moderate relief of his pain. He has been taking all of his medications regularly including his ticagrelor. He has not been taking aspirin at home.  Patient's last cath was in May 2019, at which time he was noted to have high-grade SVG disease that was stented. His LVEF was noted to be 40% at that time, which was newly reduced. He has been followed by Dr. Gwenlyn Found.   Review of Systems:     Cardiac Review of Systems: {Y] = yes [ ]  = no  Chest Pain [ Y   ]  Resting SOB [   ] Exertional SOB  [  ]  Orthopnea [  ]   Pedal Edema [   ]    Palpitations [  ] Syncope  [  ]   Presyncope [   ]  General Review of Systems: [Y] = yes [  ]=no Constitional: recent weight change [  ]; anorexia [  ]; fatigue [  ]; nausea [ Y ]; night sweats [  ]; fever [  ]; or chills [  ];                                                                     Dental: poor dentition[  ];   Eye : blurred vision [  ]; diplopia [   ]; vision changes [  ];  Amaurosis fugax[  ]; Resp: cough [  ];  wheezing[  ];  hemoptysis[  ]; shortness of breath[  ]; paroxysmal nocturnal dyspnea[  ]; dyspnea on exertion[  ]; or orthopnea[  ];  GI:   gallstones[  ], vomiting[  ];  dysphagia[  ]; melena[  ];  hematochezia [  ]; heartburn[  ];   GU: kidney stones [  ]; hematuria[  ];   dysuria [  ];  nocturia[  ];               Skin: rash [  ], swelling[  ];, hair loss[  ];  peripheral edema[  ];  or itching[  ]; Musculosketetal: myalgias[  ];  joint swelling[  ];  joint erythema[  ];  joint pain[  ];  back pain[  ];  Heme/Lymph: bruising[  ];  bleeding[  ];  anemia[  ];  Neuro: TIA[  ];  headaches[  ];  stroke[  ];  vertigo[  ];  seizures[  ];  paresthesias[  ];  difficulty walking[  ];  Psych:depression[  ]; anxiety[  ];  Endocrine: diabetes[ Y ];  thyroid dysfunction[  ];  Other:  Past Medical History:  Diagnosis Date  . CAD, multiple vessel    Severe native CAD involving left main, LAD, RCA, ramus intermedius and PDA -> status post CABG x2 (LIMA-LAD, SVG-L PDA)  . Coronary artery disease involving coronary bypass graft of native heart with angina pectoris (HCC) - thrombotic 95% oclusion of SVG-LPDA 04/03/2018   occluded LAD, subtotally occluded nondominant to codominant RCA, LCx & RI.  LIMA-LAD was intact. CULPRIT LESION was the SVG-dLPLA that had a high-grade proximal thrombotic stenosis.  S/P successful PCI of SVG-LPDA stented his vein graft with a 3.5 mm drug-eluting stent using distal protection.  [name] Kitchen. Hx of CABG   . Hyperlipidemia associated with type 2 diabetes mellitus (HCC)   . Hypertension   . Tobacco abuse   . Type II diabetes mellitus with complication (HCC)    History of stroke and CAD-CABG    Prior to Admission medications   Medication Sig Start Date End Date Taking? Authorizing Provider  amiodarone (PACERONE) 200 MG tablet Take 100 mg by mouth daily.   Yes [provider]  BRILINTA 90 MG TABS tablet TAKE 1 TABLET(90 MG TOTAL) BY MOUTH TWICE DAILY Patient taking differently: Take 90 mg by mouth 2 (two) times daily.  03/20/19  Yes Runell GessBerry, Jonathan J, MD  Lancets [facility](ONETOUCH ULTRASOFT) lancets Use as instructed  07/05/16  Yes Layne BentonBiby, Sharon L, NP  metFORMIN (GLUCOPHAGE) 500 MG tablet Take 1 tablet (500 mg total) by mouth 2 (two) times daily with a meal. 04/11/18  Yes Kilroy, Luke K, PA-C  nitroGLYCERIN (NITROSTAT) 0.4 MG SL tablet Place 1 tablet (0.4 mg total) under the tongue every 5 (five) minutes as needed. Patient taking differently: Place 0.4 mg under the tongue every 5 (five) minutes as needed for chest pain.  04/05/18  Yes Arty Baumgartneroberts, Lindsay B, NP  atorvastatin (LIPITOR) 80 MG tablet Take 1 tablet (80 mg total) by mouth daily at 6 PM. Patient not taking: Reported on 12/21/2018 04/05/18   Laverda Pageoberts, Lindsay B, NP  empagliflozin (JARDIANCE) 10 MG TABS tablet Take 10 mg by mouth daily. Patient not taking: Reported on 12/21/2018 04/05/18   Laverda Pageoberts, Lindsay B, NP  valsartan (DIOVAN) 40 MG tablet Take 1 tablet (40 mg total) by mouth daily. Patient not taking: Reported on 12/21/2018 04/05/18 12/21/18  Arty Baumgartneroberts, Lindsay B, NP     Allergies  Allergen Reactions  . Penicillins Itching and Rash    Has patient had a PCN reaction causing immediate rash, facial/tongue/throat swelling, SOB or lightheadedness with hypotension: Yes Has patient had a PCN reaction causing severe rash involving mucus membranes or skin necrosis: No Has patient had a PCN reaction that required hospitalization: No Has patient had a PCN reaction occurring within the last 10 years: No If all of the above answers are "NO", then may proceed with Cephalosporin use.  . Codeine Hives    Social History   Socioeconomic History  . Marital status: Single    Spouse name: Not on file  . Number of children: Not on file  . Years of education: Not on file  . Highest education level: Not on file  Occupational History  . Not on file  Social Needs  . Financial resource strain: Not on file  . Food insecurity:    Worry: Not on file    Inability: Not on file  .  Transportation needs:    Medical: Not on file    Non-medical: Not on file  Tobacco Use  .  Smoking status: Current Every Day Smoker  . Smokeless tobacco: Never Used  Substance and Sexual Activity  . Alcohol use: No  . Drug use: Yes    Types: Marijuana  . Sexual activity: Not on file  Lifestyle  . Physical activity:    Days per week: Not on file    Minutes per session: Not on file  . Stress: Not on file  Relationships  . Social connections:    Talks on phone: Not on file    Gets together: Not on file    Attends religious service: Not on file    Active member of club or organization: Not on file    Attends meetings of clubs or organizations: Not on file    Relationship status: Not on file  . Intimate partner violence:    Fear of current or ex partner: Not on file    Emotionally abused: Not on file    Physically abused: Not on file    Forced sexual activity: Not on file  Other Topics Concern  . Not on file  Social History Narrative  . Not on file    Family History  Problem Relation Age of Onset  . CAD Mother        CABG 50's, died 3's    PHYSICAL EXAM: Vitals:   04/30/19 0345 04/30/19 0400  BP: 135/76 125/84  Pulse: 80 73  Resp: (!) 25   Temp:    SpO2: 98% 95%   General:  Uncomfortable, in pain HEENT: normal Neck: supple. no JVD. Cor: PMI nondisplaced. Regular rate & rhythm. No rubs, gallops or murmurs. Lungs: CTAB, normal WOB Abdomen: soft, nontender, nondistended. No hepatosplenomegaly. No bruits or masses. Good bowel sounds. Extremities: no cyanosis, clubbing, rash, edema Neuro: alert & oriented x 3, cranial nerves grossly intact. moves all 4 extremities w/o difficulty. Affect pleasant.  ECG: NSR, HR 87, left axis deviation, borderline QTc, 2-75mm ST depression scattered throughout the precordium suggestive of acute ischemia, no Q waves, no ST elevation  Results for orders placed or performed during the hospital encounter of 04/30/19 (from the past 24 hour(s))  Basic metabolic panel     Status: Abnormal   Collection Time: 04/30/19  1:43 AM   Result Value Ref Range   Sodium 136 135 - 145 mmol/L   Potassium 3.9 3.5 - 5.1 mmol/L   Chloride 98 98 - 111 mmol/L   CO2 23 22 - 32 mmol/L   Glucose, Bld 252 (H) 70 - 99 mg/dL   BUN 19 8 - 23 mg/dL   Creatinine, Ser 4.23 0.61 - 1.24 mg/dL   Calcium 9.3 8.9 - 53.6 mg/dL   GFR calc non Af Amer >60 >60 mL/min   GFR calc Af Amer >60 >60 mL/min   Anion gap 15 5 - 15  CBC     Status: None   Collection Time: 04/30/19  1:43 AM  Result Value Ref Range   WBC 10.0 4.0 - 10.5 K/uL   RBC 5.50 4.22 - 5.81 MIL/uL   Hemoglobin 15.9 13.0 - 17.0 g/dL   HCT 14.4 31.5 - 40.0 %   MCV 87.3 80.0 - 100.0 fL   MCH 28.9 26.0 - 34.0 pg   MCHC 33.1 30.0 - 36.0 g/dL   RDW 86.7 61.9 - 50.9 %   Platelets 231 150 - 400 K/uL   nRBC 0.0  0.0 - 0.2 %  Troponin I - ONCE - STAT     Status: Abnormal   Collection Time: 04/30/19  1:43 AM  Result Value Ref Range   Troponin I 0.15 (HH) <0.03 ng/mL   Dg Chest 2 View  Result Date: 04/30/2019 CLINICAL DATA:  68 year old male with progressive chest pain. Smoker. EXAM: CHEST - 2 VIEW COMPARISON:  04/28/2019 and earlier. FINDINGS: Prior CABG. Mediastinal contours remain within normal limits. Stable lung volumes. Visualized tracheal air column is within normal limits. No pneumothorax, pulmonary edema, pleural effusion or confluent pulmonary opacity. Chronic increased interstitial markings. No acute osseous abnormality identified. Paucity of bowel gas in the upper abdomen. IMPRESSION: No acute cardiopulmonary abnormality. Electronically Signed   By: Odessa FlemingH  Hall M.D.   On: 04/30/2019 02:00   ASSESSMENT: Lennie HummerDonald Dellis is a 68 y.o. male w/ history of HTN, CVA, smoking, DM2, HLD, obesity, multiple MI's, VT arrest, and severe multivessel CAD s/p CABG in 2012 who presents with NSTEMI. He continues to have significant chest pain and has not been able to tolerate much nitroglycerin due to borderline blood pressures.   PLAN/DISCUSSION: #) NSTEMI Diagnostics: - repeat troponin - repeat  echo - check lipids, [employer]: - NPO for cath in AM - ASA 324mg  then 81mg  daily (notably patient initially refused aspirin because his "blood is too thin already" but eventually agreed after explanation of why this is necessary) - heparin drip for ACS per pharmacy protocol - atorvastatin 80mg  QHS - ordered for home valsartan but does not take this per pharmacy med rec - SLN, nitro gtt PRN - PRN fentanyl for chest pain refractory to nitro - continue ticagrelor 90mg  BID, will likely re-load in cath lab - would benefit from beta blocker for chronic angina, will stop amiodarone (as was suggested by Dr. Allyson SabalBerry in August 2019) and start beta blocker at this time - cardiac rehab - smoking cessation counseling  #) DM - hold metformin this AM for cath - supposed to be on jardiance but does not take this per pharmacy med rec  #) History of VT arrest: - stop amiodarone as per above, start metoprolol 6.25mg  q6h in hospital to monitor for ability to tolerate with borderline blood pressure  #) Medication adherence: patient not taking multiple prescribed therapies (statin, jardiance, valsartan) at home, although he does take ticagrelor regularly. Would benefit from meeting with pharmacist or other care team member to reinforce the importance of adherence to his prescribed medication regimen to reduce his risk for future cardiovascular events.   Rosario JacksAnthony Philip Timothy Trudell, MD Cardiology Fellow, PGY-6

## 2019-04-30 NOTE — ED Notes (Signed)
Matthew Moses [phone]

## 2019-04-30 NOTE — Consult Note (Signed)
ORTHOPAEDIC CONSULTATION HISTORY & PHYSICAL REQUESTING PHYSICIAN: Lars Masson, MD  Chief Complaint: left forearm swelling/pain  HPI: Matthew Moses is a 68 y.o. male who underwent cardiac catheterization via left radial artery access earlier today.  Post procedurally, was noted to have hematoma, which has continued to expand, now with fairly intense pain, coolness of the hand, numbness in the fingers, and tight forearm.  Past Medical History:  Diagnosis Date   CAD, multiple vessel    Severe native CAD involving left main, LAD, RCA, ramus intermedius and PDA -> status post CABG x2 (LIMA-LAD, SVG-L PDA)   Coronary artery disease involving coronary bypass graft of native heart with angina pectoris (HCC) - thrombotic 95% oclusion of SVG-LPDA 04/03/2018   occluded LAD, subtotally occluded nondominant to codominant RCA, LCx & RI.  LIMA-LAD was intact. CULPRIT LESION was the SVG-dLPLA that had a high-grade proximal thrombotic stenosis.  S/P successful PCI of SVG-LPDA stented his vein graft with a 3.5 mm drug-eluting stent using distal protection.   Hx of CABG    Hyperlipidemia associated with type 2 diabetes mellitus (HCC)    Hypertension    Tobacco abuse    Type II diabetes mellitus with complication (HCC)    History of stroke and CAD-CABG   Past Surgical History:  Procedure Laterality Date   CORONARY ARTERY BYPASS GRAFT     CORONARY/GRAFT ACUTE MI REVASCULARIZATION N/A 04/02/2018   Procedure: Coronary/Graft Acute MI Revascularization;  Surgeon: [name], MD;  Location: [facility] INVAS[facility] CV [facility];  Service: Cardiovascular: Thrombotic 95% SVG-L [facility] - thrombectomy -> DES PCI Synergy DES 3.5 x 20 (3.9 mm)   KNEE SURGERY     LEFT HEART CATH AND CORS/GRAFTS ANGIOGRAPHY N/A 04/02/2018   Procedure: LEFT HEART CATH AND CORS/GRAFTS ANGIOGRAPHY;  Surgeon: [name], MD;  Location: [facility] INVASIVE CV LAB;  Service: Cardiovascular: Ostial Left Main 50%; distal Left Main-pLAD 100% -  d(apical)LAD 95 (after patent LIMA-LAD).OstRI - 99%; ost-mLCx 95%, dCx 95% & OM2 95%; ost-pRCA (co-dominant) 99%mRCA 80%; SVG-LPDA 99% ostial thrombosis; EF 30% with a EDP 31 mmHg   MUSCLE REPAIR     TONSILLECTOMY     Social History   Socioeconomic History   Marital status: Single    Spouse name: Not on file   Number of children: Not on file   Years of education: Not on file   Highest education level: Not on file  Occupational History   Not on file  Social Needs   Financial resource strain: Not on file   Food insecurity:    Worry: Not on file    Inability: Not on file   Transportation needs:    Medical: Not on file    Non-medical: Not on file  Tobacco Use   Smoking status: Current Every Day Smoker   Smokeless tobacco: Never Used  Substance and Sexual Activity   Alcohol use: No   Drug use: Yes    Types: Marijuana   Sexual activity: Not on file  Lifestyle   Physical activity:    Days per week: Not on file    Minutes per session: Not on file   Stress: Not on file  Relationships   Social connections:    Talks on phone: Not on file    Gets together: Not on file    Attends religious service: Not on file    Active member of club or organization: Not on file    Attends meetings of clubs or organizations: Not on file  Relationship status: Not on file  Other Topics Concern   Not on file  Social History Narrative   Not on file   Family History  Problem Relation Age of Onset   CAD Mother        CABG 24's, died 110's   Allergies  Allergen Reactions   Penicillins Itching and Rash    Has patient had a PCN reaction causing immediate rash, facial/tongue/throat swelling, SOB or lightheadedness with hypotension: Yes Has patient had a PCN reaction causing severe rash involving mucus membranes or skin necrosis: No Has patient had a PCN reaction that required hospitalization: No Has patient had a PCN reaction occurring within the last 10 years: No If all  of the above answers are "NO", then may proceed with Cephalosporin use.   Codeine Hives   Prior to Admission medications   Medication Sig Start Date End Date Taking? Authorizing Provider  amiodarone (PACERONE) 200 MG tablet Take 100 mg by mouth daily.   Yes [provider]  BRILINTA 90 MG TABS tablet TAKE 1 TABLET(90 MG TOTAL) BY MOUTH TWICE DAILY Patient taking differently: Take 90 mg by mouth 2 (two) times daily.  03/20/19  Yes Lorretta Harp, MD  Lancets [facility] ULTRASOFT) lancets Use as instructed 07/05/16  Yes Donzetta Starch, NP  metFORMIN (GLUCOPHAGE) 500 MG tablet Take 1 tablet (500 mg total) by mouth 2 (two) times daily with a meal. 04/11/18  Yes Kilroy, Luke K, PA-C  nitroGLYCERIN (NITROSTAT) 0.4 MG SL tablet Place 1 tablet (0.4 mg total) under the tongue every 5 (five) minutes as needed. Patient taking differently: Place 0.4 mg under the tongue every 5 (five) minutes as needed for chest pain.  04/05/18  Yes Cheryln Manly, NP  atorvastatin (LIPITOR) 80 MG tablet Take 1 tablet (80 mg total) by mouth daily at 6 PM. Patient not taking: Reported on 12/21/2018 04/05/18   Reino Bellis B, NP  empagliflozin (JARDIANCE) 10 MG TABS tablet Take 10 mg by mouth daily. Patient not taking: Reported on 12/21/2018 04/05/18   Reino Bellis B, NP  valsartan (DIOVAN) 40 MG tablet Take 1 tablet (40 mg total) by mouth daily. Patient not taking: Reported on 12/21/2018 04/05/18 12/21/18  Cheryln Manly, NP   Dg Chest 2 View  Result Date: 04/30/2019 CLINICAL DATA:  68 year old male with progressive chest pain. Smoker. EXAM: CHEST - 2 VIEW COMPARISON:  04/28/2019 and earlier. FINDINGS: Prior CABG. Mediastinal contours remain within normal limits. Stable lung volumes. Visualized tracheal air column is within normal limits. No pneumothorax, pulmonary edema, pleural effusion or confluent pulmonary opacity. Chronic increased interstitial markings. No acute osseous abnormality identified. Paucity  of bowel gas in the upper abdomen. IMPRESSION: No acute cardiopulmonary abnormality. Electronically Signed   By: Genevie Ann M.D.   On: 04/30/2019 02:00    Positive ROS: All other systems have been reviewed and were otherwise negative with the exception of those mentioned in the HPI and as above.  Physical Exam: Vitals: Refer to EMR. Constitutional:  WD, WN, NAD HEENT:  NCAT, EOMI Neuro/Psych:  Alert & oriented to person, place, and time; appropriate mood & affect Lymphatic: No generalized extremity edema or lymphadenopathy Extremities / MSK:  The extremities are normal with respect to appearance, ranges of motion, joint stability, muscle strength/tone, sensation, & perfusion except as otherwise noted:  Left forearm is swollen and tight, particularly volar compartment.  All of the digits are clenched, there is pain with passive extension, the hand is cool compared to  the contralateral, the radial pulses palpable and ulnar pulse is not.  Intact light touch sensibility in the radial, median, and ulnar distributions, with intact motor to the same, but with subjective paresthesias throughout all digits.  Assessment: Developing left upper extremity compartment syndrome following radial artery access for cardiac procedure  Plan: I discussed these findings with him and the indications for surgical exploration, with decompressive fasciotomy and possible arterial repair.  Goals, risk, options and indications were reviewed and consent obtained.  Depending on the degree of intraoperative swelling, wound will likely not be completely closed primarily, but we have discussed returning to the operating room semi-electively at some point in the future for delayed primary closure.  Cliffton Astersavid A. Janee Mornhompson, MD      Orthopaedic & Hand Surgery [facility] [address] [place], [place]  [id number] Office: [phone] Mobile: [phone]  04/30/2019, 6:32 PM

## 2019-04-30 NOTE — Anesthesia Preprocedure Evaluation (Addendum)
Anesthesia Evaluation  Patient identified by MRN, date of birth, ID band Patient awake    Reviewed: Allergy & Precautions, NPO status , Patient's Chart, lab work & pertinent test results  History of Anesthesia Complications Negative for: history of anesthetic complications  Airway Mallampati: II  TM Distance: >3 FB Neck ROM: Full    Dental  (+) Edentulous Upper, Edentulous Lower   Pulmonary Current Smoker,    Pulmonary exam normal breath sounds clear to auscultation       Cardiovascular hypertension, + CAD, + Past MI, + Cardiac Stents, + CABG and +CHF  Normal cardiovascular exam+ dysrhythmias Atrial Fibrillation  Rhythm:Regular Rate:Normal  Acute MI today  EF 35-40%   Neuro/Psych Anxiety CVA negative psych ROS   GI/Hepatic negative GI ROS, Neg liver ROS,   Endo/Other  diabetes, Oral Hypoglycemic Agents  Renal/GU negative Renal ROS  negative genitourinary   Musculoskeletal negative musculoskeletal ROS (+)   Abdominal   Peds negative pediatric ROS (+)  Hematology negative hematology ROS (+) brilinta   Anesthesia Other Findings   Reproductive/Obstetrics negative OB ROS                           Anesthesia Physical Anesthesia Plan  ASA: IV and emergent  Anesthesia Plan: General   Post-op Pain Management:    Induction: Intravenous and Rapid sequence  PONV Risk Score and Plan: 2 and Ondansetron, Dexamethasone and Treatment may vary due to age or medical condition  Airway Management Planned: Oral ETT  Additional Equipment:   Intra-op Plan:   Post-operative Plan: Extubation in OR  Informed Consent: I have reviewed the patients History and Physical, chart, labs and discussed the procedure including the risks, benefits and alternatives for the proposed anesthesia with the patient or authorized representative who has indicated his/her understanding and acceptance.     Dental  advisory given  Plan Discussed with: CRNA and Surgeon  Anesthesia Plan Comments:         Anesthesia Quick Evaluation

## 2019-04-30 NOTE — ED Notes (Signed)
Pt c/o lt arm pain and chest tightness since Friday he was seen at [facility] then  thye had no beds and wanted him to go  To [facility].  The pt refused and went home..  For 4 hours he had the symptoms . Iv placed iv meds started  Pt placed on nasal 02 but he took it off claiming that the 02 was stopping up his nose.  He also reports that he has leukemia  2 episodes of bleeding accurred from sore on his arms  From that tonight

## 2019-04-30 NOTE — ED Provider Notes (Signed)
[place] EMERGENCY DEPARTMENT Provider Note   CSN: [id number] Arrival date & time: 04/30/19  0114    History   Chief Complaint Chief Complaint  Patient presents with  . Chest Pain    HPI Matthew Moses is a 68 y.o. male.     The history is provided by the patient.  Chest Pain  Pain location:  Substernal area Pain quality: dull   Pain radiates to:  Neck, L arm and R arm Pain severity:  Moderate Onset quality:  Sudden Timing:  Constant Progression:  Improving Chronicity:  Recurrent Context: at rest   Relieved by:  Nothing Worsened by:  Nothing Ineffective treatments:  Nitroglycerin Associated symptoms: nausea and shortness of breath   Associated symptoms: no fever and no palpitations   Risk factors: coronary artery disease, diabetes mellitus, male sex and smoking   Patient with known ASCVD s/p CABG and cath presents with recurrent chest pain.  Seen at [facility] on Friday and they planned admission for unstable angina but they had no beds and wanted to transfer the patient to [facility], patient signed out AMA.  Awakened with CP at 12 am.  Took 3 SL NTG and symptoms not relieved and called 911.    Past Medical History:  Diagnosis Date  . CAD, multiple vessel    Severe native CAD involving left main, LAD, RCA, ramus intermedius and PDA -> status post CABG x2 (LIMA-LAD, SVG-L PDA)  . Coronary artery disease involving coronary bypass graft of native heart with angina pectoris ([place]) - thrombotic 95% oclusion of SVG-LPDA 04/03/2018   occluded LAD, subtotally occluded nondominant to codominant RCA, LCx & RI.  LIMA-LAD was intact. CULPRIT LESION was the SVG-dLPLA that had a high-grade proximal thrombotic stenosis.  S/P successful PCI of SVG-LPDA stented his vein graft with a 3.5 mm drug-eluting stent using distal protection.  [name] Kitchen Hx of CABG   . Hyperlipidemia associated with type 2 diabetes mellitus ([place])   . Hypertension   . Tobacco abuse   . Type II diabetes mellitus with  complication (HCC)    History of stroke and CAD-CABG    Patient Active Problem List   Diagnosis Date Noted  . Unstable angina ([place]) 12/21/2018  . Other chronic pain 06/09/2018  . Tobacco dependence 06/09/2018  . Ventricular tachycardia, sustained ([place]) -n setting of STEMI 06/05/2018  . Coronary artery disease involving coronary bypass graft of native heart without angina pectoris 06/05/2018  . Type 2 diabetes mellitus with hyperglycemia ([place]) 06/05/2018  . Anxiety 05/23/2018  . Leg edema, right 04/19/2018  . History of CVA (cerebrovascular accident) 04/11/2018  . CAD S/P percutaneous coronary angioplasty 04/11/2018  . Pleuritic chest pain 04/11/2018  . Ischemic cardiomyopathy - 04/03/2018  . Tobacco abuse   . Acute ST elevation myocardial infarction (STEMI) of posterior wall ([place]) 04/02/2018  . Diabetes mellitus type 2, controlled, with complications ([place]) 82/95/6213  . Cigarette smoker 07/05/2016  . Marijuana abuse 07/05/2016  . Obesity 07/05/2016  . Rheumatoid arthritis ([place]) 07/05/2016  . Cerebral infarction ([place]) - L posterior limb internal capsule d/t small vessel disease, s/p IV tPA 07/02/2016  . Benign essential hypertension 07/25/2015  . Hypercholesterolemia 07/25/2015  . S/P CABG (coronary artery bypass graft) 07/25/2015  . Coronary artery disease involving native coronary artery of native heart without angina pectoris 07/25/2015  . Long-term use of aspirin therapy 07/25/2015  . Paroxysmal atrial fibrillation ([place]) 07/25/2015    Past Surgical History:  Procedure Laterality Date  . CORONARY ARTERY BYPASS GRAFT    .  CORONARY/GRAFT ACUTE MI REVASCULARIZATION N/A 04/02/2018   Procedure: Coronary/Graft Acute MI Revascularization;  Surgeon: Runell GessBerry, Jonathan J, MD;  [facility];  Service: Cardiovascular: Thrombotic 95% SVG-L WUJ:WJXBPDA:PTCA - thrombectomy -> DES PCI Synergy DES 3.5 x 20 (3.9 mm)  . KNEE SURGERY    . LEFT HEART CATH AND CORS/GRAFTS ANGIOGRAPHY N/A  04/02/2018   Procedure: LEFT HEART CATH AND CORS/GRAFTS ANGIOGRAPHY;  Surgeon: Runell GessBerry, Jonathan J, MD;  Location: [facility] INVASIVE CV LAB;  Service: Cardiovascular: Ostial Left Main 50%; distal Left Main-pLAD 100% - d(apical)LAD 95 (after patent LIMA-LAD).OstRI - 99%; ost-mLCx 95%, dCx 95% & OM2 95%; ost-pRCA (co-dominant) 99%mRCA 80%; SVG-LPDA 99% ostial thrombosis; EF 30% with a EDP 31 mmHg  . MUSCLE REPAIR    . TONSILLECTOMY          Home Medications    Prior to Admission medications   Medication Sig Start Date End Date Taking? Authorizing Provider  amiodarone (PACERONE) 200 MG tablet Take 100 mg by mouth daily.    [provider]  atorvastatin (LIPITOR) 80 MG tablet Take 1 tablet (80 mg total) by mouth daily at 6 PM. Patient not taking: Reported on 12/21/2018 04/05/18   Arty Baumgartneroberts, Lindsay B, NP  BRILINTA 90 MG TABS tablet TAKE 1 TABLET(90 MG TOTAL) BY MOUTH TWICE DAILY 03/20/19   Runell GessBerry, Jonathan J, MD  empagliflozin (JARDIANCE) 10 MG TABS tablet Take 10 mg by mouth daily. Patient not taking: Reported on 12/21/2018 04/05/18   Laverda Pageoberts, Lindsay B, NP  Lancets [facility](ONETOUCH ULTRASOFT) lancets Use as instructed 07/05/16   Layne BentonBiby, Sharon L, NP  metFORMIN (GLUCOPHAGE) 500 MG tablet Take 1 tablet (500 mg total) by mouth 2 (two) times daily with a meal. 04/11/18   Kilroy, Eda PaschalLuke K, PA-C  nitroGLYCERIN (NITROSTAT) 0.4 MG SL tablet Place 1 tablet (0.4 mg total) under the tongue every 5 (five) minutes as needed. Patient taking differently: Place 0.4 mg under the tongue every 5 (five) minutes as needed for chest pain.  04/05/18   Arty Baumgartneroberts, Lindsay B, NP  valsartan (DIOVAN) 40 MG tablet Take 1 tablet (40 mg total) by mouth daily. Patient not taking: Reported on 12/21/2018 04/05/18 12/21/18  Arty Baumgartneroberts, Lindsay B, NP    Family History Family History  Problem Relation Age of Onset  . CAD Mother        CABG 2850's, died 3660's    Social History Social History   Tobacco Use  . Smoking status: Current Every Day Smoker   . Smokeless tobacco: Never Used  Substance Use Topics  . Alcohol use: No  . Drug use: Yes    Types: Marijuana     Allergies   Penicillins and Codeine   Review of Systems Review of Systems  Constitutional: Negative for fever.  Respiratory: Positive for shortness of breath.   Cardiovascular: Positive for chest pain. Negative for palpitations and leg swelling.  Gastrointestinal: Positive for nausea.  All other systems reviewed and are negative.    Physical Exam Updated Vital Signs BP 90/60 (BP Location: Right Arm)   Pulse 87   Temp 97.9 F (36.6 C) (Oral)   Resp (!) 21   Ht 6\' 1"  (1.854 m)   Wt 108.9 kg   SpO2 96%   BMI 31.66 kg/m   Physical Exam Vitals signs and nursing note reviewed.  Constitutional:      Appearance: He is normal weight. He is not diaphoretic.  HENT:     Head: Normocephalic and atraumatic.     Nose: Nose  normal.  Eyes:     Conjunctiva/sclera: Conjunctivae normal.     Pupils: Pupils are equal, round, and reactive to light.  Neck:     Musculoskeletal: Normal range of motion and neck supple.  Cardiovascular:     Rate and Rhythm: Normal rate and regular rhythm.     Pulses: Normal pulses.     Heart sounds: Normal heart sounds.  Pulmonary:     Effort: Pulmonary effort is normal.     Breath sounds: Normal breath sounds.  Abdominal:     General: Abdomen is flat. Bowel sounds are normal.     Tenderness: There is no abdominal tenderness. There is no guarding.  Musculoskeletal: Normal range of motion.     Right lower leg: No edema.     Left lower leg: No edema.  Skin:    General: Skin is warm and dry.     Capillary Refill: Capillary refill takes less than 2 seconds.  Neurological:     General: No focal deficit present.     Mental Status: He is alert and oriented to person, place, and time.  Psychiatric:        Mood and Affect: Mood normal.        Behavior: Behavior normal.      ED Treatments / Results  Labs (all labs ordered are listed,  but only abnormal results are displayed) Results for orders placed or performed during the hospital encounter of 04/30/19  Basic metabolic panel  Result Value Ref Range   Sodium 136 135 - 145 mmol/L   Potassium 3.9 3.5 - 5.1 mmol/L   Chloride 98 98 - 111 mmol/L   CO2 23 22 - 32 mmol/L   Glucose, Bld 252 (H) 70 - 99 mg/dL   BUN 19 8 - 23 mg/dL   Creatinine, Ser 8.24 0.61 - 1.24 mg/dL   Calcium 9.3 8.9 - 23.5 mg/dL   GFR calc non Af Amer >60 >60 mL/min   GFR calc Af Amer >60 >60 mL/min   Anion gap 15 5 - 15  CBC  Result Value Ref Range   WBC 10.0 4.0 - 10.5 K/uL   RBC 5.50 4.22 - 5.81 MIL/uL   Hemoglobin 15.9 13.0 - 17.0 g/dL   HCT 36.1 44.3 - 15.4 %   MCV 87.3 80.0 - 100.0 fL   MCH 28.9 26.0 - 34.0 pg   MCHC 33.1 30.0 - 36.0 g/dL   RDW 00.8 67.6 - 19.5 %   Platelets 231 150 - 400 K/uL   nRBC 0.0 0.0 - 0.2 %  Troponin I - ONCE - STAT  Result Value Ref Range   Troponin I 0.15 (HH) <0.03 ng/mL   Dg Chest 2 View  Result Date: 04/30/2019 CLINICAL DATA:  68 year old male with progressive chest pain. Smoker. EXAM: CHEST - 2 VIEW COMPARISON:  04/28/2019 and earlier. FINDINGS: Prior CABG. Mediastinal contours remain within normal limits. Stable lung volumes. Visualized tracheal air column is within normal limits. No pneumothorax, pulmonary edema, pleural effusion or confluent pulmonary opacity. Chronic increased interstitial markings. No acute osseous abnormality identified. Paucity of bowel gas in the upper abdomen. IMPRESSION: No acute cardiopulmonary abnormality. Electronically Signed   By: Odessa Fleming M.D.   On: 04/30/2019 02:00    EKG EKG Interpretation  Date/Time:  Monday April 30 2019 01:21:35 EDT Ventricular Rate:  87 PR Interval:  172 QRS Duration: 88 QT Interval:  396 QTC Calculation: 476 R Axis:   -40 Text Interpretation:  Normal sinus rhythm Left  axis deviation ST changes inferiorly and anteriorly  Confirmed by Nicanor Alcon, Crystallynn Noorani ([id number]) on 04/30/2019 1:30:11 AM   Radiology Dg  Chest 2 View  Result Date: 04/30/2019 CLINICAL DATA:  68 year old male with progressive chest pain. Smoker. EXAM: CHEST - 2 VIEW COMPARISON:  04/28/2019 and earlier. FINDINGS: Prior CABG. Mediastinal contours remain within normal limits. Stable lung volumes. Visualized tracheal air column is within normal limits. No pneumothorax, pulmonary edema, pleural effusion or confluent pulmonary opacity. Chronic increased interstitial markings. No acute osseous abnormality identified. Paucity of bowel gas in the upper abdomen. IMPRESSION: No acute cardiopulmonary abnormality. Electronically Signed   By: Odessa Fleming M.D.   On: 04/30/2019 02:00    Procedures Procedures (including critical care time)  Medications Ordered in ED Medications  sodium chloride flush (NS) 0.9 % injection 3 mL (has no administration in time range)  heparin ADULT infusion 100 units/mL (25000 units/272mL sodium chloride 0.45%) (1,400 Units/hr Intravenous New Bag/Given 04/30/19 0336)  nitroGLYCERIN 50 mg in dextrose 5 % 250 mL (0.2 mg/mL) infusion (3 mcg/min Intravenous New Bag/Given 04/30/19 0404)  fentaNYL (SUBLIMAZE) injection 100 mcg (has no administration in time range)  aspirin chewable tablet 324 mg (has no administration in time range)  heparin bolus via infusion 4,000 Units (4,000 Units Intravenous Bolus from Bag 04/30/19 0336)  fentaNYL (SUBLIMAZE) injection 50 mcg (50 mcg Intravenous Given 04/30/19 0351)    MDM Reviewed: previous chart, nursing note and vitals Reviewed previous: ECG Interpretation: labs, ECG and x-ray (elevated troponin normal electrolytes, no PNA on CXR by me ) Total time providing critical care: 75-105 minutes (heparin and nitro drip ). This excludes time spent performing separately reportable procedures and services. Consults: cardiology   CRITICAL CARE Performed by: Zanayah Shadowens K Alexas Basulto-Rasch Total critical care time: 75  minutes Critical care time was exclusive of separately billable procedures and treating  other patients. Critical care was necessary to treat or prevent imminent or life-threatening deterioration. Critical care was time spent personally by me on the following activities: development of treatment plan with patient and/or surrogate as well as nursing, discussions with consultants, evaluation of patient's response to treatment, examination of patient, obtaining history from patient or surrogate, ordering and performing treatments and interventions, ordering and review of laboratory studies, ordering and review of radiographic studies, pulse oximetry and re-evaluation of patient's condition. The patient appears reasonably stabilized for admission considering the current resources, flow, and capabilities available in the ED at this time, and I doubt any other [facility] requiring further screening and/or treatment in the ED prior to admission. Final Clinical Impressions(s) / ED Diagnoses   Admit to cardiology for Nstemi with ischemic changes anteriorly.      Chiana Wamser, MD 04/30/19 [phone]

## 2019-04-30 NOTE — Anesthesia Postprocedure Evaluation (Signed)
Anesthesia Post Note  Patient: Matthew Moses  Procedure(s) Performed: FASCIOTOMY AND  ARTERY REPAIR (Left Arm Lower)     Patient location during evaluation: PACU Anesthesia Type: General Level of consciousness: awake and alert, patient cooperative and oriented Pain management: pain level controlled Vital Signs Assessment: post-procedure vital signs reviewed and stable Respiratory status: spontaneous breathing, nonlabored ventilation, respiratory function stable and patient connected to nasal cannula oxygen Cardiovascular status: blood pressure returned to baseline and stable Postop Assessment: no apparent nausea or vomiting Anesthetic complications: no    Last Vitals:  Vitals:   04/30/19 2105 04/30/19 2120  BP: 99/65 (!) 118/58  Pulse: 60 65  Resp: 13 13  Temp:    SpO2: 92% 95%    Last Pain:  Vitals:   04/30/19 2120  TempSrc:   PainSc: 0-No pain                 Darris Carachure,E. Myrle Wanek

## 2019-04-30 NOTE — Progress Notes (Addendum)
BP cuff removed and Dr Ellyn Hack in to see pt.Consult ordered by Dr Ellyn Hack..  Dr Carlis Abbott in to see pt.  Started to deflate TR Band .  Left arm remains elevated and no change in swelling. No bleeding during deflation.   Pt remains alert and oriented X4, sitting up on side of bed with arm elevated on pillow over the side table and able to move fingers without any difficulty.   Pt taken to Short Stay A and report was given to [facility].

## 2019-04-30 NOTE — Progress Notes (Addendum)
Progress Note  Patient Name: Matthew Moses Date of Encounter: 04/30/2019  Primary Cardiologist: Quay Burow, MD   Subjective   The patient continues to have 5/10 retrosternal chest pain radiating to his arm as well as SOB. It started on Friday night and persisted on and off all weekend.  Inpatient Medications    Scheduled Meds: . [START ON 05/01/2019] aspirin EC  81 mg Oral Daily  . atorvastatin  80 mg Oral q1800  . metoprolol tartrate  6.25 mg Oral Q6H  . ticagrelor  90 mg Oral BID   Continuous Infusions: . heparin 1,400 Units/hr (04/30/19 0336)  . nitroGLYCERIN 10 mcg/min (04/30/19 0757)   PRN Meds: acetaminophen, nitroGLYCERIN, ondansetron (ZOFRAN) IV   Vital Signs    Vitals:   04/30/19 0345 04/30/19 0400 04/30/19 0730 04/30/19 0800  BP: 135/76 125/84 113/75 112/69  Pulse: 80 73 (!) 58 66  Resp: (!) 25  20 20   Temp:      TempSrc:      SpO2: 98% 95% 98% 98%  Weight:      Height:        Intake/Output Summary (Last 24 hours) at 04/30/2019 0841 Last data filed at 04/30/2019 0338 Gross per 24 hour  Intake -  Output 300 ml  Net -300 ml   Last 3 Weights 04/30/2019 12/21/2018 12/21/2018  Weight (lbs) 240 lb 238 lb 1.6 oz 238 lb 1.6 oz  Weight (kg) 108.863 kg 108 kg 108 kg      Telemetry    SR - Personally Reviewed  ECG    SR, non-specificic ST T wave abnormalities, unchanged from prior - Personally Reviewed  Physical Exam  In mild distress sec to chest pain GEN: obese Neck: No JVD Cardiac: RRR, no murmurs, rubs, or gallops.  Respiratory: Clear to auscultation bilaterally. GI: Soft, nontender, non-distended  MS: No edema; No deformity. Neuro:  Nonfocal  Psych: Normal affect   Labs    Chemistry Recent Labs  Lab 04/30/19 0143  NA 136  K 3.9  CL 98  CO2 23  GLUCOSE 252*  BUN 19  CREATININE 0.99  CALCIUM 9.3  GFRNONAA >60  GFRAA >60  ANIONGAP 15     Hematology Recent Labs  Lab 04/30/19 0143  WBC 10.0  RBC 5.50  HGB 15.9  HCT 48.0  MCV  87.3  MCH 28.9  MCHC 33.1  RDW 13.1  PLT 231    Cardiac Enzymes Recent Labs  Lab 04/30/19 0143  TROPONINI 0.15*   No results for input(s): TROPIPOC in the last 168 hours.   BNPNo results for input(s): BNP, PROBNP in the last 168 hours.   DDimer No results for input(s): DDIMER in the last 168 hours.   Radiology    Dg Chest 2 View  Result Date: 04/30/2019 CLINICAL DATA:  68 year old male with progressive chest pain. Smoker. EXAM: CHEST - 2 VIEW COMPARISON:  04/28/2019 and earlier. FINDINGS: Prior CABG. Mediastinal contours remain within normal limits. Stable lung volumes. Visualized tracheal air column is within normal limits. No pneumothorax, pulmonary edema, pleural effusion or confluent pulmonary opacity. Chronic increased interstitial markings. No acute osseous abnormality identified. Paucity of bowel gas in the upper abdomen. IMPRESSION: No acute cardiopulmonary abnormality. Electronically Signed   By: Genevie Ann M.D.   On: 04/30/2019 02:00   Cardiac Studies    Patient Profile     68 y.o. male w/ history of HTN, CVA, smoking, DM2, HLD, obesity, multiple MI's, VT arrest, and severe multivessel CAD s/p CABG  in 2012 who presents with NSTEMI. He continues to have significant chest pain and has not been able to tolerate much nitroglycerin due to borderline blood pressures.   Assessment & Plan    #) NSTEMI - NPO for cath in AM, continue heparin and NTG drip - troponin 0.15 - ASA 324mg then 81mg daily (notably patient initially refused aspirin because his "blood is too thin already" but eventually agreed after explanation of why this is necessary) -- atorvastatin 80mg QHS - ordered for home valsartan but does not take this per pharmacy med rec - PRN fentanyl for chest pain refractory to nitro - continue ticagrelor 90mg BID, will likely re-load in cath lab - would benefit from beta blocker for chronic angina, will stop amiodarone (as was suggested by Dr. Berry in August 2019) and  start beta blocker at this time - cardiac rehab - smoking cessation counseling  #) DM - hold metformin this AM for cath - supposed to be on jardiance but does not take this per pharmacy med rec  #) History of VT arrest: - stop amiodarone as per above, start metoprolol 6.25mg q6h in hospital to monitor for ability to tolerate with borderline blood pressure  #) Medication adherence: patient not taking multiple prescribed therapies (statin, jardiance, valsartan) at home, although he does take ticagrelor regularly. Would benefit from meeting with pharmacist or other care team member to reinforce the importance of adherence to his prescribed medication regimen to reduce his risk for future cardiovascular events.   The patient was admitted by an overnight fellow, however required further attention for critical decision making and therapies, total time spent with the patient 35 minutes.  For questions or updates, please contact [facility] Please consult [email or web address] for contact info under     Signed, Tristin Gladman, MD  04/30/2019, 8:41 AM    

## 2019-04-30 NOTE — Progress Notes (Signed)
ANTICOAGULATION CONSULT NOTE  Pharmacy Consult for Heparin Indication: chest pain/ACS and atrial fibrillation  Patient Measurements: Height: 6\' 1"  (185.4 cm) Weight: 240 lb (108.9 kg) IBW/kg (Calculated) : 79.9 Heparin Dosing Weight: 100 kg  Vital Signs: Temp: 97.8 F (36.6 C) (06/08 0326) Temp Source: Oral (06/08 0326) BP: 104/61 (06/08 1027) Pulse Rate: 69 (06/08 1027)  Labs: Recent Labs    04/30/19 0143 04/30/19 0952  HGB 15.9  --   HCT 48.0  --   PLT 231  --   HEPARINUNFRC  --  0.41  CREATININE 0.99  --   TROPONINI 0.15*  --     Estimated Creatinine Clearance: 96.3 mL/min (by C-G formula based on SCr of 0.99 mg/dL).  Assessment: 68 y.o. male with chest pain continues on IV heparin. Initial heparin level is therapeutic at 0.41. Baseline CBC is WNL. Planning on cardiac cath today. No bleeding or other issues noted.   Goal of Therapy:  Heparin level 0.3-0.7 units/ml Monitor platelets by anticoagulation protocol: Yes   Plan:  Continue heparin gtt 1400 units/hr Daily heparin level and CBC vs f/u post-cath  Matthew Moses, Rande Lawman 04/30/2019,10:51 AM

## 2019-04-30 NOTE — ED Triage Notes (Signed)
Pt transported from home with worsening upper chest pain radiating down bilat upper extremities x 10 min. Extensive cardiac hx, nitro x 1 at home x 2 with EMS with only slight improvement. Emesis x 1. Pt seen at [facility] Friday for same, dx with unstable angina.  EMS unable to est IV, pt advised EMS he is not suppose to take ASA

## 2019-04-30 NOTE — ED Provider Notes (Addendum)
Received call from cardiology that patient is in pain and writhing in bed.  Went with nurse Santiago Glad to the bedside.  Patient is sound asleep at this time without internvention.  NTG gtt is being titrated and patient will recieve an additional dose of fentanyl.  Discussed the need for intervention with cardiology fellow Dr. Leandrew Koyanagi, Maksym Pfiffner, MD 04/30/19 [place], Shea Kapur, MD 04/30/19 6754

## 2019-04-30 NOTE — Progress Notes (Signed)
ANTICOAGULATION CONSULT NOTE - Initial Consult  Pharmacy Consult for Heparin Indication: chest pain/ACS and atrial fibrillation  Allergies  Allergen Reactions  . Penicillins Itching and Rash    Has patient had a PCN reaction causing immediate rash, facial/tongue/throat swelling, SOB or lightheadedness with hypotension: Yes Has patient had a PCN reaction causing severe rash involving mucus membranes or skin necrosis: No Has patient had a PCN reaction that required hospitalization: No Has patient had a PCN reaction occurring within the last 10 years: No If all of the above answers are "NO", then may proceed with Cephalosporin use.  . Codeine Hives    Patient Measurements: Height: 6\' 1"  (185.4 cm) Weight: 240 lb (108.9 kg) IBW/kg (Calculated) : 79.9 Heparin Dosing Weight: 100 kg  Vital Signs: Temp: 97.9 F (36.6 C) (06/08 0140) Temp Source: Oral (06/08 0140) BP: 90/60 (06/08 0140) Pulse Rate: 87 (06/08 0140)  Labs: Recent Labs    04/30/19 0143  HGB 15.9  HCT 48.0  PLT 231    CrCl cannot be calculated (Patient's most recent lab result is older than the maximum 21 days allowed.).   Medical History: Past Medical History:  Diagnosis Date  . CAD, multiple vessel    Severe native CAD involving left main, LAD, RCA, ramus intermedius and PDA -> status post CABG x2 (LIMA-LAD, SVG-L PDA)  . Coronary artery disease involving coronary bypass graft of native heart with angina pectoris (HCC) - thrombotic 95% oclusion of SVG-LPDA 04/03/2018   occluded LAD, subtotally occluded nondominant to codominant RCA, LCx & RI.  LIMA-LAD was intact. CULPRIT LESION was the SVG-dLPLA that had a high-grade proximal thrombotic stenosis.  S/P successful PCI of SVG-LPDA stented his vein graft with a 3.5 mm drug-eluting stent using distal protection.  04/05/2018 Hx of CABG   . Hyperlipidemia associated with type 2 diabetes mellitus (HCC)   . Hypertension   . Tobacco abuse   . Type II diabetes mellitus with  complication (HCC)    History of stroke and CAD-CABG    Medications:  No current facility-administered medications on file prior to encounter.    Current Outpatient Medications on File Prior to Encounter  Medication Sig Dispense Refill  . amiodarone (PACERONE) 200 MG tablet Take 100 mg by mouth daily.    [name] Kitchen atorvastatin (LIPITOR) 80 MG tablet Take 1 tablet (80 mg total) by mouth daily at 6 PM. (Patient not taking: Reported on 12/21/2018) 90 tablet 1  . BRILINTA 90 MG TABS tablet TAKE 1 TABLET(90 MG TOTAL) BY MOUTH TWICE DAILY 180 tablet 0  . empagliflozin (JARDIANCE) 10 MG TABS tablet Take 10 mg by mouth daily. (Patient not taking: Reported on 12/21/2018) 30 tablet 1  . Lancets (ONETOUCH ULTRASOFT) lancets Use as instructed 100 each 12  . metFORMIN (GLUCOPHAGE) 500 MG tablet Take 1 tablet (500 mg total) by mouth 2 (two) times daily with a meal. 180 tablet 4  . nitroGLYCERIN (NITROSTAT) 0.4 MG SL tablet Place 1 tablet (0.4 mg total) under the tongue every 5 (five) minutes as needed. (Patient taking differently: Place 0.4 mg under the tongue every 5 (five) minutes as needed for chest pain. ) 25 tablet 2  . valsartan (DIOVAN) 40 MG tablet Take 1 tablet (40 mg total) by mouth daily. (Patient not taking: Reported on 12/21/2018) 30 tablet 2     Assessment: 68 y.o. male with chest pain for heparin  Goal of Therapy:  Heparin level 0.3-0.7 units/ml Monitor platelets by anticoagulation protocol: Yes   Plan:  Heparin 4000  units IV bolus, then start heparin 1400 units/hr Check heparin level in 6 hours.   Caryl Pina 04/30/2019,3:14 AM

## 2019-04-30 NOTE — Op Note (Signed)
04/30/2019  8:11 PM  PATIENT:  Matthew Moses  68 y.o. male  PRE-OPERATIVE DIAGNOSIS:  Left forearm compartment syndrome  POST-OPERATIVE DIAGNOSIS:  Same, plus radial artery injury  PROCEDURE:   1. Left forearm decompressive fasciotomy    2. Left radial artery repair  SURGEON: Rayvon Char. Grandville Silos, MD  PHYSICIAN ASSISTANT: Morley Kos, OPA-C  ANESTHESIA:  general  SPECIMENS:  None  DRAINS:   None  EBL:  less than 50 mL  PREOPERATIVE INDICATIONS:  [name] is a  68 y.o. male who underwent cardiac catheterization earlier today, with arterial access via left radial artery.  Post procedurally, developed expanding hematoma of left forearm resulting in early compartment syndrome.  The risks benefits and alternatives were discussed with the patient preoperatively including but not limited to the risks of infection, bleeding, nerve injury, cardiopulmonary complications, the need for revision surgery, among others, and the patient verbalized understanding and consented to proceed.  OPERATIVE IMPLANTS: None  OPERATIVE PROCEDURE:  After receiving prophylactic antibiotics, the patient was escorted to the operative theatre and placed in a supine position.  General anesthesia was administered.  A surgical "time-out" was performed during which the planned procedure, proposed operative site, and the correct patient identity were compared to the operative consent and agreement confirmed by the circulating nurse according to current facility policy.  Following application of a tourniquet to the operative extremity, the exposed skin was prepped with Chloraprep and draped in the usual sterile fashion.  The limb was exsanguinated with an Esmarch bandage and the tourniquet inflated to approximately 165mmHg higher than systolic BP.  A curvilinear sigmoidal shaped incision was marked on the volar surface of the left forearm.  The skin was incised sharply with a scalpel.  The subcutaneous tissues under  significant pressure, and at one point when blood was released it actually was dispelled under pressure such that it hit the gallons of those observing.  Volar forearm fasciotomy was performed, both superficial and deep to the FCR.  The area of the arterial access was scrutinized, and radial artery defect noted.  It appeared to be directly repairable.  It was exposed, some of the adventitia trimmed, and the tourniquet was released.  Vascular clamps were placed on both sides of the artery, and heparinized saline injected in both directions.  There was brisk pulsatile bleeding from the proximal vessel, and there was adequate but less pulsatile bleeding that was retrograde.  The clamps were then replaced, and the arterial defect closed with 8-0 nylon interrupted sutures.  This was done with loupe magnification.  The clamps were removed, and patency test revealed bidirectional flow across the anastomosis.  At this point, the hand was warmer than preoperatively, with good capillary refill and radial pulse was palpable distal to the exposure.  The wound was copiously irrigated, and the skin reapproximated loosely, allowing for some gapping, using 2-0 nylon interrupted suture.  The forearm was soft.  Dressing was applied with bacitracin placed at the open wound.  A dressing was loose, minimizing circumferential materials.  He was awakened and taken to recovery room in stable condition.  DISPOSITION: He will be returned to the floor for routine postoperative care.  Will begin dressing changes within a couple days, and likely return semi-electively to the operating room in a week or so for anticipated delayed primary closure.

## 2019-04-30 NOTE — Progress Notes (Signed)
Grandville Silos MD notified of dressing bleeding, surgeon says this is fine and expected.

## 2019-05-01 ENCOUNTER — Inpatient Hospital Stay (HOSPITAL_COMMUNITY): Payer: Medicare Other

## 2019-05-01 ENCOUNTER — Encounter (HOSPITAL_COMMUNITY): Payer: Self-pay | Admitting: Cardiology

## 2019-05-01 ENCOUNTER — Other Ambulatory Visit: Payer: Self-pay | Admitting: Orthopedic Surgery

## 2019-05-01 DIAGNOSIS — F17210 Nicotine dependence, cigarettes, uncomplicated: Secondary | ICD-10-CM

## 2019-05-01 DIAGNOSIS — I214 Non-ST elevation (NSTEMI) myocardial infarction: Secondary | ICD-10-CM

## 2019-05-01 DIAGNOSIS — I5043 Acute on chronic combined systolic (congestive) and diastolic (congestive) heart failure: Secondary | ICD-10-CM

## 2019-05-01 DIAGNOSIS — I25709 Atherosclerosis of coronary artery bypass graft(s), unspecified, with unspecified angina pectoris: Secondary | ICD-10-CM

## 2019-05-01 DIAGNOSIS — Z9889 Other specified postprocedural states: Secondary | ICD-10-CM

## 2019-05-01 LAB — BASIC METABOLIC PANEL
Anion gap: 11 (ref 5–15)
BUN: 15 mg/dL (ref 8–23)
CO2: 24 mmol/L (ref 22–32)
Calcium: 8.5 mg/dL — ABNORMAL LOW (ref 8.9–10.3)
Chloride: 98 mmol/L (ref 98–111)
Creatinine, Ser: 1.05 mg/dL (ref 0.61–1.24)
GFR calc Af Amer: >60 mL/min (ref >60)
GFR calc non Af Amer: >60 mL/min (ref >60)
Glucose, Bld: 387 mg/dL — ABNORMAL HIGH (ref 70–99)
Potassium: 5.1 mmol/L (ref 3.5–5.1)
Sodium: 133 mmol/L — ABNORMAL LOW (ref 135–145)

## 2019-05-01 LAB — CBC
HCT: 43.3 % (ref 39.0–52.0)
Hemoglobin: 14.7 g/dL (ref 13.0–17.0)
MCH: 29.1 pg (ref 26.0–34.0)
MCHC: 33.9 g/dL (ref 30.0–36.0)
MCV: 85.6 fL (ref 80.0–100.0)
Platelets: 184 10*3/uL (ref 150–400)
RBC: 5.06 MIL/uL (ref 4.22–5.81)
RDW: 13.2 % (ref 11.5–15.5)
WBC: 13.1 10*3/uL — ABNORMAL HIGH (ref 4.0–10.5)
nRBC: 0 % (ref 0.0–0.2)

## 2019-05-01 MED ORDER — ASPIRIN 81 MG PO TBEC: 81.0000 mg | DELAYED_RELEASE_TABLET | Freq: Every day | ORAL | Status: DC

## 2019-05-01 MED ORDER — HYDROMORPHONE HCL 1 MG/ML IJ SOLN
1.0000 mg | INTRAMUSCULAR | Status: DC | PRN
  Administered 2019-05-01: 1 mg via INTRAVENOUS
  Filled 2019-05-01: qty 1

## 2019-05-01 MED ORDER — FUROSEMIDE 40 MG PO TABS: 40.0000 mg | ORAL_TABLET | Freq: Every day | ORAL | Status: DC

## 2019-05-01 MED ORDER — OXYCODONE HCL 5 MG PO TABS: 5.0000 mg | ORAL_TABLET | Freq: Four times a day (QID) | ORAL | 0 refills | Status: DC | PRN

## 2019-05-01 MED ORDER — METOPROLOL TARTRATE 25 MG PO TABS: 12.5000 mg | ORAL_TABLET | Freq: Two times a day (BID) | ORAL | 2 refills | Status: DC

## 2019-05-01 MED ORDER — OXYCODONE HCL 5 MG PO TABS
5.0000 mg | ORAL_TABLET | Freq: Four times a day (QID) | ORAL | Status: DC | PRN
  Administered 2019-05-01: 5 mg via ORAL
  Filled 2019-05-01: qty 2

## 2019-05-01 MED ORDER — FUROSEMIDE 40 MG PO TABS: 40.0000 mg | ORAL_TABLET | Freq: Every day | ORAL | 3 refills | Status: DC

## 2019-05-01 MED ORDER — METOPROLOL TARTRATE 12.5 MG HALF TABLET
12.5000 mg | ORAL_TABLET | Freq: Two times a day (BID) | ORAL | Status: DC
  Filled 2019-05-01: qty 1

## 2019-05-01 MED ORDER — ACETAMINOPHEN 325 MG PO TABS
650.0000 mg | ORAL_TABLET | Freq: Four times a day (QID) | ORAL | Status: DC
  Administered 2019-05-01 (×2): 650 mg via ORAL
  Filled 2019-05-01 (×3): qty 2

## 2019-05-01 MED ORDER — ACETAMINOPHEN 325 MG PO TABS: 650.0000 mg | ORAL_TABLET | Freq: Four times a day (QID) | ORAL | Status: DC

## 2019-05-01 NOTE — Care Management (Signed)
ED CM received call from floor RN patient not having a way to get home. Taxi voucher provided.

## 2019-05-01 NOTE — Progress Notes (Signed)
McDaniel NP notified of the Diabetes Coordinators's recommendations. Diabetes education reinforced with patient, he verbalized understanding.

## 2019-05-01 NOTE — Progress Notes (Signed)
Patient in a stable condition, discharge education reviewed with patient he verbalized understanding, iv removed, tele dc,ccmd notified, patient belongings at bedside.

## 2019-05-01 NOTE — Progress Notes (Signed)
L FA fasciotomy & radial artery repair 04-30-19  Sitting up, eating breakfast, using left hand for such as well. C/o only faint tingling in radial 3 digits Pain much improved vs. preop  Digits all similarly cool L & R, with brisk CR Intact LT sens R/M/U and intact motor Dressing has been loosely reinforced, no apparent active hemorrhage  May d/c anytime from hand surgery POV Leave dressing until office f/u on Thursday Office will call him to arrange. Rx for oxycodone sent to pharmacy.  Micheline Rough, MD Hand Surgery

## 2019-05-01 NOTE — Progress Notes (Signed)
  Echocardiogram 2D Echocardiogram has been performed.  Tanyia Grabbe G Angely Dietz 05/01/2019, 11:02 AM

## 2019-05-01 NOTE — Progress Notes (Signed)
CARDIAC REHAB PHASE I   PRE:  Rate/Rhythm: 66 SR    BP: sitting 107/64    SaO2: 98 RA  MODE:  Ambulation: 940 ft   POST:  Rate/Rhythm: 82 SR    BP: sitting 147/86     SaO2:   Tolerated ambulation well with quick pace. He likes to walk. Discussed stent, Brilinta importance, smoking cessation, diet, exercise, NTG, and CRPII. Will refer to [place] however he is not interested. He likes to do things on his own. He is frustrated today. Sometimes has his own perspective contrary to medicine.  [id number], [id number]   [place], ACSM 05/01/2019 11:48 AM    \

## 2019-05-01 NOTE — Progress Notes (Signed)
Received call from Dr. Milly Jakob advising that he should not be the attending for this pt due to previous cardiology diagnosis and procedure. He was a consult for surgical intervention only. He requested that we reassign the pt to cardiology. Edited attending MD back to Ena Dawley MD as this was the attending MD under cardiology services previous orders for admission.

## 2019-05-01 NOTE — Progress Notes (Addendum)
Inpatient Diabetes Program Recommendations  AACE/ADA: New Consensus Statement on Inpatient Glycemic Control  Target Ranges:  Prepandial:   less than 140 mg/dL      Peak postprandial:   less than 180 mg/dL (1-2 hours)      Critically ill patients:  140 - 180 mg/dL  Results for OSMANY, AZER (MRN [id number]) as of 05/01/2019 11:08  Ref. Range 04/30/2019 10:22 04/30/2019 11:32 04/30/2019 14:57 04/30/2019 18:36  Glucose-Capillary Latest Ref Range: 70 - 99 mg/dL 027 (H) 253 (H) 664 (H) 240 (H)   Results for BRAN, ALDRIDGE (MRN [id number]) as of 05/01/2019 11:08  Ref. Range 07/03/2016 04:24 04/02/2018 05:43 04/30/2019 01:43  Hemoglobin A1C Latest Ref Range: 4.8 - 5.6 % 11.8 (H) 7.7 (H) 10.6 (H)   Review of Glycemic Control  Diabetes history: DM2 Outpatient Diabetes medications: Metformin 500 mg BID, Jardiance 10 mg daily (Not taking Jardiance) Current orders for Inpatient glycemic control: Nonw  Inpatient Diabetes Program Recommendations:   Insulin - Basal: Patient received Decadron 5 mg x 1 on 04/30/19 and lab glcose 387 at 2:41 am today. Please consider ordering Lantus 10 units x1 now.  Correction (SSI): Please consider ordering CBGs ACHS with Novolog 0-20 units TID with meals and Novolog 0-5 units QHS.  HgbA1C: A1C 10.6% on 04/30/19 indicating an average glucse of 258 mg/dl over the past 2-3 months. Patient reports that he was not taking the Jardiance because he thought it was for cholesterol.  Patient states that he is very frustrated with DM and he has been watching his diet and he is very upset that A1C has increased. Explained that if he would take the Metformin and Jardiance and continue healthy dietary changes, DM control will likely improve. However, patient states he plans to stop taking Metformin and go back to eating whatever he wants.  NOTE: Spoke with patient over the phone about diabetes and home regimen for diabetes control. Patient reports that he was taking only Metformin 500 mg BID as an  outpatient for diabetes control. Inquired about Jardiance and patient asked what that medication was. Discussed Jardiance and how it works for DM control. Patient stated that he thought Jardiance was for cholesterol so he had stopped taking it. Patient reports his glucose is usually always over 200 mg/dl. Discussed A1C results ( 10.6% on 04/30/19) and explained that current A1C indicates an average glucose of 258 mg/dl over the past 2-3 months. Patient was upset that A1C had went up to 10.6%. Patient reports that he has been trying to eat better (eating fish, chicken, and salads) and he is angry that his A1C went up.  Explained to patient that if he would restart his Jardiance, continue the Metformin, and continue to maintain a healthy diet, his DM control will mostly likely improve.  Discussed glucose and A1C goals. Discussed importance of checking CBGs and maintaining good CBG control to prevent long-term and short-term complications. Explained how hyperglycemia leads to damage within blood vessels which lead to the common complications seen with uncontrolled diabetes. Stressed to the patient the importance of improving glycemic control to prevent further complications from uncontrolled diabetes especially given extensive medical history. Discussed impact of nutrition, exercise, stress, sickness, and medications on diabetes control. Patient stated that he is done with taking care of his DM and he plans to stop take the Metformin and is going back to eating whaterver he wants to eat. Provided emotional support and tried to reason with patient about how DM control should improve if he takes DM  medications as prescribed and continues to eat healthy but patient is adamant that he is not going to do anything to take care of DM and is going back to eating whatever he wants. Patient expressed thanks for talking with him and then said he was finished talking about it.  Asked patient to think about what was discussed and  expressed concern about his health if he stops DM medications completely.  Patient states he understands but he has made up his mind.  Thanks, Barnie Alderman, RN, MSN, CDE Diabetes Coordinator Inpatient Diabetes Program [phone] (Team Pager)

## 2019-05-01 NOTE — Progress Notes (Signed)
Progress Note  Patient Name: Matthew Moses Date of Encounter: 05/01/2019  Primary Cardiologist: Nanetta Batty, MD   Subjective   The patient has no chest pain but continues to have mild arm pains post fasciotomy yesterday.  Inpatient Medications    Scheduled Meds: . acetaminophen  650 mg Oral Q6H  . aspirin EC  81 mg Oral Daily  . atorvastatin  80 mg Oral q1800  . [START ON 05/02/2019] atorvastatin  80 mg Oral Q0600  . furosemide  40 mg Intravenous Q12H  . metoprolol tartrate  6.25 mg Oral Q6H  . promethazine      . sodium chloride flush  3 mL Intravenous Q12H  . sodium chloride flush  3 mL Intravenous Q12H  . ticagrelor  90 mg Oral BID   Continuous Infusions: . sodium chloride    . sodium chloride    . nitroGLYCERIN Stopped (04/30/19 1137)   PRN Meds: sodium chloride, sodium chloride, HYDROmorphone, nitroGLYCERIN, ondansetron (ZOFRAN) IV, oxyCODONE, sodium chloride flush, sodium chloride flush   Vital Signs    Vitals:   04/30/19 2150 04/30/19 2207 05/01/19 0350 05/01/19 0820  BP: 109/64 110/76 (!) 140/115 132/77  Pulse: 66 72 66 62  Resp: 13 20 18 20   Temp: (!) 97.5 F (36.4 C) 98 F (36.7 C) (!) 97.5 F (36.4 C) 97.8 F (36.6 C)  TempSrc:  Oral Oral Oral  SpO2: 93% 95% 98% 96%  Weight:   112.3 kg   Height:        Intake/Output Summary (Last 24 hours) at 05/01/2019 0940 Last data filed at 05/01/2019 0820 Gross per 24 hour  Intake 840 ml  Output 3708 ml  Net -2868 ml   Last 3 Weights 05/01/2019 04/30/2019 12/21/2018  Weight (lbs) 247 lb 9.2 oz 240 lb 238 lb 1.6 oz  Weight (kg) 112.3 kg 108.863 kg 108 kg      Telemetry    SR - Personally Reviewed  ECG    SR, non-specificic ST T wave abnormalities, unchanged from prior - Personally Reviewed  Physical Exam  In mild distress sec to chest pain GEN: obese Neck: No JVD Cardiac: RRR, no murmurs, rubs, or gallops.  Respiratory: Clear to auscultation bilaterally. GI: Soft, nontender, non-distended  MS: No  edema; No deformity. Neuro:  Nonfocal  Psych: Normal affect   Labs    Chemistry Recent Labs  Lab 04/30/19 0143 05/01/19 0241  NA 136 133*  K 3.9 5.1  CL 98 98  CO2 23 24  GLUCOSE 252* 387*  BUN 19 15  CREATININE 0.99 1.05  CALCIUM 9.3 8.5*  GFRNONAA >60 >60  GFRAA >60 >60  ANIONGAP 15 11     Hematology Recent Labs  Lab 04/30/19 0143 05/01/19 0241  WBC 10.0 13.1*  RBC 5.50 5.06  HGB 15.9 14.7  HCT 48.0 43.3  MCV 87.3 85.6  MCH 28.9 29.1  MCHC 33.1 33.9  RDW 13.1 13.2  PLT 231 184    Cardiac Enzymes Recent Labs  Lab 04/30/19 0143  TROPONINI 0.15*   No results for input(s): TROPIPOC in the last 168 hours.   BNPNo results for input(s): BNP, PROBNP in the last 168 hours.   DDimer No results for input(s): DDIMER in the last 168 hours.   Radiology    Dg Chest 2 View  Result Date: 04/30/2019 CLINICAL DATA:  68 year old male with progressive chest pain. Smoker. EXAM: CHEST - 2 VIEW COMPARISON:  04/28/2019 and earlier. FINDINGS: Prior CABG. Mediastinal contours remain within normal limits.  Stable lung volumes. Visualized tracheal air column is within normal limits. No pneumothorax, pulmonary edema, pleural effusion or confluent pulmonary opacity. Chronic increased interstitial markings. No acute osseous abnormality identified. Paucity of bowel gas in the upper abdomen. IMPRESSION: No acute cardiopulmonary abnormality. Electronically Signed   By: Genevie Ann M.D.   On: 04/30/2019 02:00   Cardiac Studies   Cath: 04/30/2019  SUMMARY:  Severe Multivessel Native CAD wtih Patent LIMA-LAD & severe In-stent Re-stenosis & proximal stenosis of SVG-LPDA  Successful Scoring Balloon PTCA & DES PCI of SVG-LPDA (Synergy DES 3.5 mm x 20 mm - post-dilated to 4.1 mm)  Severely Elevated LVEDP (LV Gram not performed to conserve contrast) -- with known reduced EF - ACUTE ON CHRONIC COMBINED SYSTOLIC & DIASTOLIC CHF  Post TR Band hematoma - controlled with manual pressure & BP cuff  placed promimally to the TR Band. RECOMMENDATIONS  Will monitor in PACU Holding area for ~1 hr prior to returning to Nursing Unit  Begin deflation of BP cuff by 10 mmHg every 10 min after 20 min (start @ 1510)  Gave 1 dose IV Lasix 40 mg in Cath Lab - will write for 2 additional doses tonight & in AM.  Anticipate potential d/c tomorrow if stable  Continue RF management & DAPT    Patient Profile     68 y.o. male w/ history of HTN, CVA, smoking, DM2, HLD, obesity, multiple MI's, VT arrest, and severe multivessel CAD s/p CABG in 2012 who presents with NSTEMI. He continues to have significant chest pain and has not been able to tolerate much nitroglycerin due to borderline blood pressures.   Assessment & Plan    #) NSTEMI - cath showed   Severe Multivessel Native CAD wtih Patent LIMA-LAD & severe In-stent Re-stenosis & proximal stenosis of SVG-LPDA  Successful Scoring Balloon PTCA & DES PCI of SVG-LPDA (Synergy DES 3.5 mm x 20 mm - post-dilated to 4.1 mm)  Severely Elevated LVEDP (LV Gram not performed to conserve contrast) -- with known reduced EF - ACUTE ON CHRONIC COMBINED SYSTOLIC & DIASTOLIC CHF  Post TR Band hematoma - controlled with manual pressure & BP cuff placed promimally to the TR Band. - he underwent an emergent fasciotomy and radial artery repair the last night, Hb 14.7 this am - continue ticagrelor 90mg  BID, started on low dose metoprolol, atorvastatin - smoking cessation counseling - crea stable at 1.05. I will switch to PO lasix 40 mg daily.  - he is awaiting an echocardiogram, vascular surgery cleared him for a discharge and arranged for a follow up - we will discharge after his echocardiogram and arrange for a follow up withing 7 days with labs - CBC, BMP, BNP  #) DM - hold metformin this AM for cath - supposed to be on jardiance but does not take this per pharmacy med rec  #) History of VT arrest: - stop amiodarone as per above, start metoprolol 6.25mg   q6h in hospital to monitor for ability to tolerate with borderline blood pressure  #) Medication adherence: patient not taking multiple prescribed therapies (statin, jardiance, valsartan) at home, although he does take ticagrelor regularly. Would benefit from meeting with pharmacist or other care team member to reinforce the importance of adherence to his prescribed medication regimen to reduce his risk for future cardiovascular events.   For questions or updates, please contact [place] Please consult [email or web address] for contact info under     Signed, Ena Dawley, MD  05/01/2019, 9:40 AM

## 2019-05-01 NOTE — Discharge Summary (Signed)
Discharge Summary    Patient ID: Matthew Moses MRN: [id number]; DOB: 1951/07/20  Admit date: 04/30/2019 Discharge date: 05/01/2019  Primary Care Provider: Patient, No Pcp Moses  Primary Cardiologist: Matthew Burow, MD   Discharge Diagnoses    Principal Problem:   NSTEMI (non-ST elevated myocardial infarction) Matthew Moses) Active Problems:   Benign essential hypertension   Diabetes mellitus type 2, controlled, with complications (Matthew Moses)   Cigarette smoker   Ischemic cardiomyopathy -   Coronary artery disease involving coronary bypass graft of native heart with angina pectoris (Matthew Moses)   Coronary stent restenosis   Acute on chronic combined systolic and diastolic CHF (congestive heart failure) (Matthew Moses)   Surgery follow-up   H/O fasciotomy  Allergies Allergies  Allergen Reactions  . Penicillins Itching and Rash    Has patient had a PCN reaction causing immediate rash, facial/tongue/throat swelling, SOB or lightheadedness with hypotension: Yes Has patient had a PCN reaction causing severe rash involving mucus membranes or skin necrosis: No Has patient had a PCN reaction that required hospitalization: No Has patient had a PCN reaction occurring within the last 10 years: No If all of the above answers are "NO", then may proceed with Cephalosporin use.  . Codeine Hives    Diagnostic Studies/Procedures    Cardiac catheterization 04/30/2019:  SUMMARY:  Severe Multivessel Native CAD wtih Patent LIMA-LAD & severe In-stent Re-stenosis & proximal stenosis of SVG-LPDA  Successful Scoring Balloon PTCA & DES PCI of SVG-LPDA (Synergy DES 3.5 mm x 20 mm - post-dilated to 4.1 mm)  Severely Elevated LVEDP (LV Gram not performed to conserve contrast) -- with known reduced EF - ACUTE ON CHRONIC COMBINED SYSTOLIC & DIASTOLIC CHF  Post TR Band hematoma - controlled with manual pressure & BP cuff placed promimally to the TR Band. RECOMMENDATIONS  Will monitor in Matthew Moses for ~1 hr prior to  returning to Nursing Unit  Begin deflation of BP cuff by 10 mmHg every 10 min after 20 min (start @ 1510)  Gave 1 dose IV Lasix 40 mg in Cath Lab - will write for 2 additional doses tonight & in AM.  Anticipate potential d/c tomorrow if stable  Continue RF management & DAPT  History of Present Illness     68 y.o. male w/ history of HTN, CVA, smoking, DM2, HLD, obesity, multiple MI's, VT arrest, and severe multivessel CAD s/p CABG in 2012 who presents with NSTEMI. He continues to have significant chest pain and has not been able to tolerate much nitroglycerin due to borderline blood pressures.  Matthew Moses has long history of severe multivessel CAD as noted above. Over the past year he describes nearly daily chest pain both at rest and with exertion. This has been significantly limiting to his lifestyle. His pain is dull aching pressure located at the central chest with radiation to the left arm. He was scheduled for cath in January 2020 for escalating chest pain but the patient left the hospital AMA prior to undergoing the procedure.   Patient then returned 04/30/2019 with worsening chest pain since midnight on day of presentation. His symptoms started acutely and woke him from sleep. He was given 3 SLN tablets by EMS at home with moderate relief of his pain. He has been taking all of his medications regularly including his ticagrelor. He has not been taking aspirin at home.  Patient's last cath was in May 2019, at which time he was noted to have high-grade SVG disease that was stented. His LVEF was noted to  be 40% at that time, which was newly reduced. He has been followed by Matthew Moses.   Hospital Course     Matthew Moses was started on heparin and nitroglycerin drip for ACS.  Troponin was found to be 0.15 in which he was given ASA 324 mg x 1 then started on ASA 81 daily.  Additionally, he was started on high intensity atorvastatin 80 mg.  He continued to have 5 out of 10 chest pain the morning  prior to his cath and was given as needed fentanyl with moderate relief.  Cardiac cath performed which showed severe multivessel native CAD with patent LIMA to LAD and severe in-stent restenosis and proximal stenosis of SVG to LPDA.  This was treated successfully with a scoring balloon PTCA and DES PCI of SVG to LPDA.  He had severely elevated LVEDP Moses cath note.  Given this, he was given 1 dose of IV Lasix 40 mg with the addition of 2 additional doses 04/30/2019 and 05/01/2019.  While still in the Cath Lab, he had a post TR band hematoma which was initially controlled manually with BP cuff placed proximal to the TR band.  While in the holding Moses, Matthew Moses reevaluated left arm and wrist sites in which the arm was soft but remained swollen and tender to touch.  On recheck, Matthew Moses felt vascular surgery should be consulted for further assessment.  Vascular surgery recommended consulting hand surgery for possible upper extremity fasciotomy repair.  At the time of surgical evaluation, hematoma had expanded with fairly intense pain, coolness of the hand, numbness in the fingers, and tight forearm. Plans were made for surgical exploration, with decompressive fasciotomy and possible arterial repair.  Recommendations were to leave dressing in place until office follow up visit on Thursday>>expect a call from their office to arrange. Prescription for oxycodone was sent to primary pharmacy [facility] orthopedic team.   On day of discharge he had no chest pain, but minor surgical pain at the site of fasciotomy. See plan above   Plan from a cardiac perspective is for PO Lasix 40mg  daily, continue ticagrelor 90mg  BID, low  dose metoprolol, and atorvastatin    Other hospital problems include:  #) DM -Restart jardiance -Needs to follow closely with PCP, hemoglobin A1c 10.6  #) History of VT arrest: -Stop home amiodarone as Moses above, start metoprolol 6.25mg  q6h in hospital to monitor for ability to tolerate  with borderline blood pressure  #) Medication adherence:  -Patient not taking multiple prescribed therapies (statin, jardiance, valsartan) at home, although he does take ticagrelor regularly. Would benefit from meeting with pharmacist or other care team member to reinforce the importance of adherence to his prescribed medication regimen to reduce his risk for future cardiovascular events.  VSS on day of discharge-132/77 Post procedure creatinine-1.05, Hb stable at 14.7  Consultants: Vascular surgery, orthopedic surgery   The patient was seen and examined by Dr. who feels that he is stable and ready for discharge.  Orthopedic surgery states the patient is ready for discharge as well.  Vital signs are stable.  We will set up for cardiology follow-up.  Orthopedic surgery follow-up for fasciotomy. _____________  Discharge Vitals Blood pressure 118/69, pulse 72, temperature 98.6 F (37 C), temperature source Oral, resp. rate (!) 21, height 6\' 1"  (1.854 m), weight 112.3 kg, SpO2 100 %.  Filed Weights   04/30/19 0142 05/01/19 0350  Weight: 108.9 kg 112.3 kg   Labs & Radiologic Studies    CBC  Recent Labs    04/30/19 0143 05/01/19 0241  WBC 10.0 13.1*  HGB 15.9 14.7  HCT 48.0 43.3  MCV 87.3 85.6  PLT 231 184   Basic Metabolic Panel Recent Labs    16/08/9605/08/20 0143 05/01/19 0241  NA 136 133*  K 3.9 5.1  CL 98 98  CO2 23 24  GLUCOSE 252* 387*  BUN 19 15  CREATININE 0.99 1.05  CALCIUM 9.3 8.5*   Cardiac Enzymes Recent Labs    04/30/19 0143  TROPONINI 0.15*   Hemoglobin A1C Recent Labs    04/30/19 0143  HGBA1C 10.6*   _____________  Dg Chest 2 View  Result Date: 04/30/2019 CLINICAL DATA:  68 year old male with progressive chest pain. Smoker. EXAM: CHEST - 2 VIEW COMPARISON:  04/28/2019 and earlier. FINDINGS: Prior CABG. Mediastinal contours remain within normal limits. Stable lung volumes. Visualized tracheal air column is within normal limits. No pneumothorax,  pulmonary edema, pleural effusion or confluent pulmonary opacity. Chronic increased interstitial markings. No acute osseous abnormality identified. Paucity of bowel gas in the upper abdomen. IMPRESSION: No acute cardiopulmonary abnormality. Electronically Signed   By: Odessa FlemingH  Hall M.D.   On: 04/30/2019 02:00   Disposition   Pt is being discharged home today in good condition.  Follow-up Plans & Appointments    Follow-up Information    Mack Hookhompson, David, MD Follow up.   Specialty:  Orthopedic Surgery Why:  office will contact you to make an appt for Thursday Contact information: [address] [place] [address] [phone]        Jodelle GrossLawrence, Kathryn M, NP Follow up on 05/09/2019.   Specialties:  Nurse Practitioner, Radiology, Cardiology Why:  Your follow-up appointment will be on 05/09/2019 at 2:45 PM.  Please call the front office to let them know that you have arrived in wait in your car for further instructions. Contact information: [address] [place] [address] [phone]          Discharge Instructions    Amb Referral to Cardiac Rehabilitation   Complete by:  As directed    Diagnosis:   Coronary Stents PTCA NSTEMI     After initial evaluation and assessments completed: Virtual Based Care may be provided alone or in conjunction with Phase 2 Cardiac Rehab based on patient barriers.:  Yes   Call MD for:  difficulty breathing, headache or visual disturbances   Complete by:  As directed    Call MD for:  extreme fatigue   Complete by:  As directed    Call MD for:  hives   Complete by:  As directed    Call MD for:  persistant dizziness or light-headedness   Complete by:  As directed    Call MD for:  persistant nausea and vomiting   Complete by:  As directed    Call MD for:  redness, tenderness, or signs of infection (pain, swelling, redness, odor or green/yellow discharge around incision site)   Complete by:  As directed    Call MD for:  severe  uncontrolled pain   Complete by:  As directed    Call MD for:  temperature >100.4   Complete by:  As directed    Diet - low sodium heart healthy   Complete by:  As directed    Discharge instructions   Complete by:  As directed    No driving for 1 week. No lifting over 5 lbs for 1 week. No sexual activity for 1 week. Keep procedure site clean &  dry. If you notice increased pain, swelling, bleeding or pus, call/return!  You may shower, but no soaking baths/hot tubs/pools for 1 week.   Please see paperwork for follow-up visit with cardiology as well as orthopedic surgery.  PLEASE DO NOT MISS ANY DOSES OF YOUR BRILINTA!!!!! Also keep a log of you blood pressures and bring back to your follow up appt. Please call the office with any questions.   Patients taking blood thinners should generally stay away from medicines like ibuprofen, Advil, Motrin, naproxen, and Aleve due to risk of stomach bleeding. You may take Tylenol as directed or talk to your primary doctor about alternatives.   Increase activity slowly   Complete by:  As directed       Discharge Medications   Allergies as of 05/01/2019      Reactions   Penicillins Itching, Rash   Has patient had a PCN reaction causing immediate rash, facial/tongue/throat swelling, SOB or lightheadedness with hypotension: Yes Has patient had a PCN reaction causing severe rash involving mucus membranes or skin necrosis: No Has patient had a PCN reaction that required hospitalization: No Has patient had a PCN reaction occurring within the last 10 years: No If all of the above answers are "NO", then may proceed with Cephalosporin use.   Codeine Hives      Medication List    STOP taking these medications   amiodarone 200 MG tablet Commonly known as:  PACERONE     TAKE these medications   acetaminophen 325 MG tablet Commonly known as:  TYLENOL Take 2 tablets (650 mg total) by mouth every 6 (six) hours.   aspirin 81 MG EC tablet Take 1 tablet  (81 mg total) by mouth daily. Start taking on:  May 02, 2019   atorvastatin 80 MG tablet Commonly known as:  LIPITOR Take 1 tablet (80 mg total) by mouth daily at 6 PM.   Brilinta 90 MG Tabs tablet Generic drug:  ticagrelor TAKE 1 TABLET(90 MG TOTAL) BY MOUTH TWICE DAILY What changed:  See the new instructions.   empagliflozin 10 MG Tabs tablet Commonly known as:  Jardiance Take 10 mg by mouth daily.   furosemide 40 MG tablet Commonly known as:  LASIX Take 1 tablet (40 mg total) by mouth daily. Start taking on:  May 02, 2019   metFORMIN 500 MG tablet Commonly known as:  Glucophage Take 1 tablet (500 mg total) by mouth 2 (two) times daily with a meal.   metoprolol tartrate 25 MG tablet Commonly known as:  LOPRESSOR Take 0.5 tablets (12.5 mg total) by mouth 2 (two) times daily.   nitroGLYCERIN 0.4 MG SL tablet Commonly known as:  Nitrostat Place 1 tablet (0.4 mg total) under the tongue every 5 (five) minutes as needed. What changed:  reasons to take this   onetouch ultrasoft lancets Use as instructed   oxyCODONE 5 MG immediate release tablet Commonly known as:  Roxicodone Take 1 tablet (5 mg total) by mouth every 6 (six) hours as needed for severe pain.   valsartan 40 MG tablet Commonly known as:  Diovan Take 1 tablet (40 mg total) by mouth daily.        Acute coronary syndrome (MI, NSTEMI, STEMI, etc) this admission?: Yes.     AHA/ACC Clinical Performance & Quality Measures: 1. Aspirin prescribed? - Yes 2. ADP Receptor Inhibitor (Plavix/Clopidogrel, Brilinta/Ticagrelor or Effient/Prasugrel) prescribed (includes medically managed patients)? - Yes 3. Beta Blocker prescribed? - Yes 4. High Intensity Statin (Lipitor 40-80mg  or Crestor  20-40mg ) prescribed? - Yes 5. EF assessed during THIS hospitalization? - Yes 6. For EF <40%, was ACEI/ARB prescribed? - Not Applicable (EF >/= 40%) 7. For EF <40%, Aldosterone Antagonist (Spironolactone or Eplerenone) prescribed? -  Not Applicable (EF >/= 40%) 8. Cardiac Rehab Phase II ordered (Included Medically managed Patients)? - Yes    Outstanding Labs/Studies   Needs CBC, BMP, BNP at follow up   Duration of Discharge Encounter   Greater than 30 minutes including physician time.  Signed, Georgie ChardJill McDaniel, NP 05/01/2019, 4:32 PM

## 2019-05-01 NOTE — Progress Notes (Signed)
Taxi voucher given to patient for transportation home. Patient taken off the unit on a wheelchair to the main entrance by a NT.

## 2019-05-01 NOTE — Progress Notes (Signed)
Chart reviewed with Dr. Christella Hartigan, who states that due to patient's history should have surgery at [facility]. Dr Biagio Borg scheduler notifed.

## 2019-05-04 ENCOUNTER — Encounter (HOSPITAL_COMMUNITY): Payer: Self-pay | Admitting: *Deleted

## 2019-05-04 ENCOUNTER — Other Ambulatory Visit: Payer: Self-pay

## 2019-05-04 NOTE — Progress Notes (Signed)
Dr. Linna Caprice, Anesthesiology, aware of pt case; pt is to continue taking Aspirin and Brilinta with new coronary stents per MD. Pt gave verbal consent for Significant other, Bettey Mare, to complete SDW-pre-op call. Santiago Glad denies that pt C/O any acute cardiopulmonary issues.  Santiago Glad stated that she awaiting a return call from surgeon to clarify if pt should take Aspirin and Brilinta on the morning surgery since " he takes them later in the morning. "  Santiago Glad made aware to have pt stop taking vitamins, fish oil and herbal medications. Do not take any NSAIDs ie: Ibuprofen, Advil, Naproxen (Aleve), Motrin, BC and Goody Powder.  Santiago Glad made aware to have pt hold Metformin on DOS. Santiago Glad made aware to have pt check BG every 2 hours prior to arrival to hospital on DOS. Santiago Glad made aware to have pt treat a BG < 70 with 4 ounces of apple or cranberry juice, wait 15 minutes after intervention to recheck BG, if BG remains < 70, call Short Stay unit to speak with a nurse .  Pt had a negative COVID-19 test on 04/30/2019 and Santiago Glad stated that the entire family was on quarantine.   Santiago Glad denies that pt and family  experienced the following symptoms:  Cough yes/no: No Fever (>100.33F)  yes/no: No Runny nose yes/no: No Sore throat yes/no: No Difficulty breathing/shortness of breath  yes/no: No  Have you or a family member traveled in the last 14 days and where? yes/no: No  Santiago Glad reminded that hospital visitation restrictions are in effect and the importance of the restrictions.   Santiago Glad verbalized understanding of all pre-op instructions.

## 2019-05-07 ENCOUNTER — Other Ambulatory Visit: Payer: Self-pay

## 2019-05-07 ENCOUNTER — Encounter (HOSPITAL_COMMUNITY): Payer: Self-pay | Admitting: *Deleted

## 2019-05-07 ENCOUNTER — Other Ambulatory Visit: Payer: Self-pay | Admitting: Cardiovascular Disease

## 2019-05-07 ENCOUNTER — Inpatient Hospital Stay (HOSPITAL_COMMUNITY): Payer: Medicare Other | Admitting: Anesthesiology

## 2019-05-07 ENCOUNTER — Encounter (HOSPITAL_COMMUNITY)
Admission: RE | Disposition: A | Discharge status: Home / Self Care | Payer: Self-pay | Source: Home / Self Care | Attending: Orthopedic Surgery

## 2019-05-07 ENCOUNTER — Ambulatory Visit (HOSPITAL_COMMUNITY)
Admission: RE | Admit: 2019-05-07 | Discharge: 2019-05-07 | Disposition: A | Discharge status: Home / Self Care | Payer: Medicare Other | Attending: Orthopedic Surgery | Admitting: Orthopedic Surgery

## 2019-05-07 DIAGNOSIS — Y838 Other surgical procedures as the cause of abnormal reaction of the patient, or of later complication, without mention of misadventure at the time of the procedure: Secondary | ICD-10-CM | POA: Insufficient documentation

## 2019-05-07 DIAGNOSIS — T8189XA Other complications of procedures, not elsewhere classified, initial encounter: Secondary | ICD-10-CM | POA: Insufficient documentation

## 2019-05-07 DIAGNOSIS — E785 Hyperlipidemia, unspecified: Secondary | ICD-10-CM | POA: Insufficient documentation

## 2019-05-07 DIAGNOSIS — E119 Type 2 diabetes mellitus without complications: Secondary | ICD-10-CM | POA: Diagnosis not present

## 2019-05-07 DIAGNOSIS — S51802A Unspecified open wound of left forearm, initial encounter: Secondary | ICD-10-CM | POA: Diagnosis present

## 2019-05-07 DIAGNOSIS — Z951 Presence of aortocoronary bypass graft: Secondary | ICD-10-CM | POA: Diagnosis not present

## 2019-05-07 DIAGNOSIS — F17210 Nicotine dependence, cigarettes, uncomplicated: Secondary | ICD-10-CM | POA: Insufficient documentation

## 2019-05-07 DIAGNOSIS — I2510 Atherosclerotic heart disease of native coronary artery without angina pectoris: Secondary | ICD-10-CM | POA: Insufficient documentation

## 2019-05-07 HISTORY — DX: Acute myocardial infarction, unspecified: I21.9

## 2019-05-07 HISTORY — DX: Unspecified open wound of left forearm, initial encounter: S51.802A

## 2019-05-07 HISTORY — PX: SECONDARY CLOSURE OF WOUND: SHX6208

## 2019-05-07 HISTORY — DX: Cerebral infarction, unspecified: I63.9

## 2019-05-07 HISTORY — DX: Presence of dental prosthetic device (complete) (partial): Z97.2

## 2019-05-07 HISTORY — DX: Headache, unspecified: R51.9

## 2019-05-07 LAB — GLUCOSE, CAPILLARY
Glucose-Capillary: 272 mg/dL — ABNORMAL HIGH (ref 70–99)
Glucose-Capillary: 248 mg/dL — ABNORMAL HIGH (ref 70–99)

## 2019-05-07 LAB — PROTIME-INR
INR: 1 (ref 0.8–1.2)
Prothrombin Time: 13.2 seconds (ref 11.4–15.2)

## 2019-05-07 SURGERY — SECONDARY CLOSURE OF WOUND
Anesthesia: General | Site: Arm Lower | Laterality: Left

## 2019-05-07 MED ORDER — FENTANYL CITRATE (PF) 100 MCG/2ML IJ SOLN
INTRAMUSCULAR | Status: AC
  Filled 2019-05-07: qty 2

## 2019-05-07 MED ORDER — PHENYLEPHRINE 40 MCG/ML (10ML) SYRINGE FOR IV PUSH (FOR BLOOD PRESSURE SUPPORT)
PREFILLED_SYRINGE | INTRAVENOUS | Status: DC | PRN
  Administered 2019-05-07: 80 ug via INTRAVENOUS
  Administered 2019-05-07: 120 ug via INTRAVENOUS

## 2019-05-07 MED ORDER — OXYCODONE HCL 5 MG/5ML PO SOLN: 5.0000 mg | Freq: Once | ORAL | Status: AC | PRN

## 2019-05-07 MED ORDER — PROMETHAZINE HCL 25 MG/ML IJ SOLN: 6.2500 mg | INTRAMUSCULAR | Status: DC | PRN

## 2019-05-07 MED ORDER — FENTANYL CITRATE (PF) 100 MCG/2ML IJ SOLN
INTRAMUSCULAR | Status: DC | PRN
  Administered 2019-05-07 (×2): 25 ug via INTRAVENOUS
  Administered 2019-05-07: 50 ug via INTRAVENOUS

## 2019-05-07 MED ORDER — ONDANSETRON HCL 4 MG/2ML IJ SOLN
INTRAMUSCULAR | Status: DC | PRN
  Administered 2019-05-07: 4 mg via INTRAVENOUS

## 2019-05-07 MED ORDER — OXYCODONE HCL 5 MG PO TABS
5.0000 mg | ORAL_TABLET | Freq: Once | ORAL | Status: AC | PRN
  Administered 2019-05-07: 5 mg via ORAL

## 2019-05-07 MED ORDER — LIDOCAINE 2% (20 MG/ML) 5 ML SYRINGE
INTRAMUSCULAR | Status: DC | PRN
  Administered 2019-05-07: 80 mg via INTRAVENOUS

## 2019-05-07 MED ORDER — TRAMADOL HCL 50 MG PO TABS: 50.0000 mg | ORAL_TABLET | Freq: Four times a day (QID) | ORAL | 0 refills | Status: DC | PRN

## 2019-05-07 MED ORDER — FENTANYL CITRATE (PF) 250 MCG/5ML IJ SOLN
INTRAMUSCULAR | Status: AC
  Filled 2019-05-07: qty 5

## 2019-05-07 MED ORDER — EPHEDRINE SULFATE-NACL 50-0.9 MG/10ML-% IV SOSY
PREFILLED_SYRINGE | INTRAVENOUS | Status: DC | PRN
  Administered 2019-05-07: 10 mg via INTRAVENOUS
  Administered 2019-05-07: 15 mg via INTRAVENOUS

## 2019-05-07 MED ORDER — SODIUM CHLORIDE 0.9 % IV SOLN
INTRAVENOUS | Status: DC | PRN
  Administered 2019-05-07: 08:00:00 25 ug/min via INTRAVENOUS

## 2019-05-07 MED ORDER — MIDAZOLAM HCL 5 MG/5ML IJ SOLN
INTRAMUSCULAR | Status: DC | PRN
  Administered 2019-05-07: 2 mg via INTRAVENOUS

## 2019-05-07 MED ORDER — HYDROMORPHONE HCL 1 MG/ML IJ SOLN
INTRAMUSCULAR | Status: AC
  Filled 2019-05-07: qty 1

## 2019-05-07 MED ORDER — CLINDAMYCIN PHOSPHATE 900 MG/50ML IV SOLN
900.0000 mg | INTRAVENOUS | Status: AC
  Administered 2019-05-07: 900 mg via INTRAVENOUS
  Filled 2019-05-07: qty 50

## 2019-05-07 MED ORDER — 0.9 % SODIUM CHLORIDE (POUR BTL) OPTIME
TOPICAL | Status: DC | PRN
  Administered 2019-05-07: 1000 mL

## 2019-05-07 MED ORDER — PROPOFOL 10 MG/ML IV BOLUS
INTRAVENOUS | Status: DC | PRN
  Administered 2019-05-07: 100 mg via INTRAVENOUS
  Administered 2019-05-07: 30 mg via INTRAVENOUS

## 2019-05-07 MED ORDER — LACTATED RINGERS IV SOLN
INTRAVENOUS | Status: DC
  Administered 2019-05-07: 07:00:00 via INTRAVENOUS

## 2019-05-07 MED ORDER — MIDAZOLAM HCL 2 MG/2ML IJ SOLN
INTRAMUSCULAR | Status: AC
  Filled 2019-05-07: qty 2

## 2019-05-07 MED ORDER — PROPOFOL 10 MG/ML IV BOLUS
INTRAVENOUS | Status: AC
  Filled 2019-05-07: qty 40

## 2019-05-07 MED ORDER — HYDROMORPHONE HCL 1 MG/ML IJ SOLN
0.2500 mg | INTRAMUSCULAR | Status: DC | PRN
  Administered 2019-05-07 (×2): 0.5 mg via INTRAVENOUS

## 2019-05-07 MED ORDER — OXYCODONE HCL 5 MG PO TABS
ORAL_TABLET | ORAL | Status: AC
  Filled 2019-05-07: qty 1

## 2019-05-07 SURGICAL SUPPLY — 42 items
BANDAGE ACE 3X5.8 VEL STRL LF (GAUZE/BANDAGES/DRESSINGS) IMPLANT
BANDAGE ACE 4X5 VEL STRL LF (GAUZE/BANDAGES/DRESSINGS) ×3 IMPLANT
BANDAGE ELASTIC 4 VELCRO ST LF (GAUZE/BANDAGES/DRESSINGS) ×3 IMPLANT
BNDG COHESIVE 4X5 TAN STRL (GAUZE/BANDAGES/DRESSINGS) ×3 IMPLANT
BNDG ESMARK 4X9 LF (GAUZE/BANDAGES/DRESSINGS) IMPLANT
BNDG GAUZE ELAST 4 BULKY (GAUZE/BANDAGES/DRESSINGS) ×3 IMPLANT
CHLORAPREP W/TINT 26ML (MISCELLANEOUS) ×3 IMPLANT
COVER SURGICAL LIGHT HANDLE (MISCELLANEOUS) ×3 IMPLANT
COVER WAND RF STERILE (DRAPES) ×3 IMPLANT
CUFF TOURNIQUET SINGLE 18IN (TOURNIQUET CUFF) ×3 IMPLANT
CUFF TOURNIQUET SINGLE 24IN (TOURNIQUET CUFF) IMPLANT
DRAPE SURG 17X23 STRL (DRAPES) ×3 IMPLANT
DRSG ADAPTIC 3X8 NADH LF (GAUZE/BANDAGES/DRESSINGS) ×3 IMPLANT
ELECT REM PT RETURN 9FT ADLT (ELECTROSURGICAL) ×3
ELECTRODE REM PT RTRN 9FT ADLT (ELECTROSURGICAL) ×1 IMPLANT
GAUZE SPONGE 4X4 12PLY STRL (GAUZE/BANDAGES/DRESSINGS) ×3 IMPLANT
GAUZE SPONGE 4X4 12PLY STRL LF (GAUZE/BANDAGES/DRESSINGS) ×3 IMPLANT
GLOVE BIO SURGEON STRL SZ7.5 (GLOVE) ×3 IMPLANT
GLOVE BIOGEL PI IND STRL 8 (GLOVE) ×1 IMPLANT
GLOVE BIOGEL PI INDICATOR 8 (GLOVE) ×2
GOWN STRL REUS W/ TWL LRG LVL3 (GOWN DISPOSABLE) ×3 IMPLANT
GOWN STRL REUS W/TWL 2XL LVL3 (GOWN DISPOSABLE) IMPLANT
GOWN STRL REUS W/TWL LRG LVL3 (GOWN DISPOSABLE) ×6
KIT BASIN OR (CUSTOM PROCEDURE TRAY) ×3 IMPLANT
KIT TURNOVER KIT B (KITS) ×3 IMPLANT
MANIFOLD NEPTUNE II (INSTRUMENTS) ×3 IMPLANT
NS IRRIG 1000ML POUR BTL (IV SOLUTION) ×3 IMPLANT
PACK ORTHO EXTREMITY (CUSTOM PROCEDURE TRAY) ×3 IMPLANT
PAD ARMBOARD 7.5X6 YLW CONV (MISCELLANEOUS) ×6 IMPLANT
PAD CAST 4YDX4 CTTN HI CHSV (CAST SUPPLIES) ×1 IMPLANT
PADDING CAST COTTON 4X4 STRL (CAST SUPPLIES) ×2
SPONGE LAP 18X18 RF (DISPOSABLE) IMPLANT
STOCKINETTE IMPERVIOUS 9X36 MD (GAUZE/BANDAGES/DRESSINGS) ×3 IMPLANT
SUT ETHILON 4 0 PS 2 18 (SUTURE) IMPLANT
SUT VICRYL RAPIDE 4/0 PS 2 (SUTURE) IMPLANT
TOWEL OR 17X24 6PK STRL BLUE (TOWEL DISPOSABLE) ×3 IMPLANT
TOWEL OR 17X26 10 PK STRL BLUE (TOWEL DISPOSABLE) ×3 IMPLANT
TUBE CONNECTING 12'X1/4 (SUCTIONS) ×1
TUBE CONNECTING 12X1/4 (SUCTIONS) ×2 IMPLANT
UNDERPAD 30X30 (UNDERPADS AND DIAPERS) ×3 IMPLANT
WATER STERILE IRR 1000ML POUR (IV SOLUTION) ×3 IMPLANT
YANKAUER SUCT BULB TIP NO VENT (SUCTIONS) ×3 IMPLANT

## 2019-05-07 NOTE — Anesthesia Postprocedure Evaluation (Signed)
Anesthesia Post Note  Patient: Matthew Moses  Procedure(s) Performed: DELAYED PRIMARY CLOSURE LEFT FOREARM WOUND (Left Arm Lower)     Patient location during evaluation: PACU Anesthesia Type: General Level of consciousness: awake and alert Pain management: pain level controlled Vital Signs Assessment: post-procedure vital signs reviewed and stable Respiratory status: spontaneous breathing, nonlabored ventilation and respiratory function stable Cardiovascular status: blood pressure returned to baseline and stable Postop Assessment: no apparent nausea or vomiting Anesthetic complications: no    Last Vitals:  Vitals:   05/07/19 0841 05/07/19 0845  BP: 103/74 104/72  Pulse:  (!) 56  Resp:  10  Temp:    SpO2:      Last Pain:  Vitals:   05/07/19 0900  TempSrc:   PainSc: 2                  Lynda Rainwater

## 2019-05-07 NOTE — Progress Notes (Signed)
Addressed dr miller/mda re; cbg 248 / no orders, ok to d/c

## 2019-05-07 NOTE — Discharge Instructions (Signed)
Discharge Instructions   You have a bulky dressing on your hand.  You may begin gentle motion of your fingers and hand immediately, but you should not do any heavy lifting or gripping.  Elevate your hand to reduce pain & swelling of the digits.  Ice over the operative site may be helpful to reduce pain & swelling.  DO NOT USE HEAT. Pain medicine was prescribed for you in the office last week.  Take Tylenol 650 mg every 6 hours. Take the Tramadol additionally for severe pain. Leave the dressing in place until the third day after your surgery and then remove it, leaving it open to air.  After the bandage has been removed you may shower, regularly washing the incision and letting the water run over it, but not submerging it (no swimming, soaking it in dishwater, etc.) You may drive a car when you are off of prescription pain medications and can safely control your vehicle with both hands. We will address whether therapy will be required or not when you return to the office. You may have already made your follow-up appointment when we completed your preop visit. If not, please call the office to schedule an appointment for 05/22/2019.   Please call [phone] during normal business hours or [phone] after hours for any problems. Including the following:  - excessive redness of the incisions - drainage for more than 4 days - fever of more than 101.5 F  *Please note that pain medications will not be refilled after hours or on weekends.  WORK STATUS: No lifting, gripping or grasping greater than paper and pencil tasks.

## 2019-05-07 NOTE — Transfer of Care (Signed)
Immediate Anesthesia Transfer of Care Note  Patient: Matthew Moses  Procedure(s) Performed: DELAYED PRIMARY CLOSURE LEFT FOREARM WOUND (Left Arm Lower)  Patient Location: PACU  Anesthesia Type:General  Level of Consciousness: awake and alert   Airway & Oxygen Therapy: Patient Spontanous Breathing and Patient connected to nasal cannula oxygen  Post-op Assessment: Report given to RN and Post -op Vital signs reviewed and stable  Post vital signs: Reviewed and stable  Last Vitals:  Vitals Value Taken Time  BP 102/63 05/07/19 0825  Temp    Pulse 54 05/07/19 0825  Resp 17 05/07/19 0825  SpO2 100 % 05/07/19 0825  Vitals shown include unvalidated device data.  Last Pain:  Vitals:   05/07/19 0538  TempSrc: Oral      Patients Stated Pain Goal: 5 (76/80/88 1103)  Complications: No apparent anesthesia complications

## 2019-05-07 NOTE — Anesthesia Preprocedure Evaluation (Signed)
Anesthesia Evaluation  Patient identified by MRN, date of birth, ID band Patient awake    Reviewed: Allergy & Precautions, NPO status , Patient's Chart, lab work & pertinent test results  History of Anesthesia Complications Negative for: history of anesthetic complications  Airway Mallampati: II  TM Distance: >3 FB Neck ROM: Full    Dental  (+) Edentulous Upper, Edentulous Lower   Pulmonary Current Smoker,    Pulmonary exam normal breath sounds clear to auscultation       Cardiovascular hypertension, + CAD, + Past MI, + Cardiac Stents, + CABG and +CHF  Normal cardiovascular exam+ dysrhythmias Atrial Fibrillation  Rhythm:Regular Rate:Normal  Acute MI today  EF 35-40%   Neuro/Psych Anxiety CVA negative psych ROS   GI/Hepatic negative GI ROS, Neg liver ROS,   Endo/Other  diabetes, Oral Hypoglycemic Agents  Renal/GU negative Renal ROS  negative genitourinary   Musculoskeletal negative musculoskeletal ROS (+)   Abdominal   Peds negative pediatric ROS (+)  Hematology negative hematology ROS (+) brilinta   Anesthesia Other Findings   Reproductive/Obstetrics negative OB ROS                             Anesthesia Physical  Anesthesia Plan  ASA: IV  Anesthesia Plan: General   Post-op Pain Management:    Induction: Intravenous  PONV Risk Score and Plan: 1 and Ondansetron and Treatment may vary due to age or medical condition  Airway Management Planned: LMA  Additional Equipment:   Intra-op Plan:   Post-operative Plan: Extubation in OR  Informed Consent: I have reviewed the patients History and Physical, chart, labs and discussed the procedure including the risks, benefits and alternatives for the proposed anesthesia with the patient or authorized representative who has indicated his/her understanding and acceptance.     Dental advisory given  Plan Discussed with: CRNA and  Surgeon  Anesthesia Plan Comments:         Anesthesia Quick Evaluation

## 2019-05-07 NOTE — Telephone Encounter (Signed)
Pt's girlfriend Santiago Glad left message requesting a refill on pt's medication nitroglycerin and Jardiance. Please address

## 2019-05-07 NOTE — Op Note (Signed)
05/07/2019  7:34 AM  PATIENT:  Matthew Moses  68 y.o. male  PRE-OPERATIVE DIAGNOSIS:  Left forearm fasciotomy wound, 25cm  POST-OPERATIVE DIAGNOSIS:  Same  PROCEDURE:  Left forearm suture removal and intermediate wound closure, 25cm  SURGEON: Rayvon Char. Grandville Silos, MD  PHYSICIAN ASSISTANT: Morley Kos, OPA-C  ANESTHESIA:  general  SPECIMENS:  None  DRAINS:   None  EBL:  less than 50 mL  PREOPERATIVE INDICATIONS:  Matthew Moses is a  68 y.o. male with a left forearm fasciotomy wound, now ready for delayed primary closure  The risks benefits and alternatives were discussed with the patient preoperatively including but not limited to the risks of infection, bleeding, nerve injury, cardiopulmonary complications, the need for revision surgery, among others, and the patient verbalized understanding and consented to proceed.  OPERATIVE IMPLANTS: none  OPERATIVE PROCEDURE:  After receiving prophylactic antibiotics, the patient was escorted to the operative theatre and placed in a supine position.  General anesthesia was administered.  A surgical "time-out" was performed during which the planned procedure, proposed operative site, and the correct patient identity were compared to the operative consent and agreement confirmed by the circulating nurse according to current facility policy.  Following application of a tourniquet to the operative extremity, the exposed skin was pre-scrubbed with a Betadine scrub brush before being formally prepped with Chloraprep and draped in the usual sterile fashion.   The wound was further cleansed and debrided with scraping debridement with the edge of a forcep, then copiously irrigated.  At this point, it was clean and fresh with viable skin edges.  The wound was reapproximated with a running 4-0 Vicryl Rapide horizontal mattress suture, removing the intervening previously placed nylon sutures as each 1 was encountered in the running process.  The forearm was  soft and the skin was not extensively taut.  Dressing was applied and the patient was taken to the recovery room in stable condition, breathing spontaneously.  DISPOSITION: The patient will be discharged home today with typical instructions, returning in 10 to 15 days for wound evaluation.

## 2019-05-07 NOTE — Anesthesia Procedure Notes (Signed)
Procedure Name: LMA Insertion Date/Time: 05/07/2019 7:45 AM Performed by: Candis Shine, CRNA Pre-anesthesia Checklist: Patient identified, Emergency Drugs available, Suction available and Patient being monitored Patient Re-evaluated:Patient Re-evaluated prior to induction Oxygen Delivery Method: Circle System Utilized Preoxygenation: Pre-oxygenation with 100% oxygen Induction Type: IV induction LMA: LMA inserted LMA Size: 5.0 Number of attempts: 1 Placement Confirmation: positive ETCO2 Tube secured with: Tape Dental Injury: Teeth and Oropharynx as per pre-operative assessment

## 2019-05-07 NOTE — Interval H&P Note (Signed)
History and Physical Interval Note:  05/07/2019 7:33 AM  Matthew Moses  has presented today for surgery, with the diagnosis of LEFT FOREARM WOUND, s/p forearm fasciotomy.  The various methods of treatment have been discussed with the patient and family. After consideration of risks, benefits and other options for treatment, the patient has consented to  Procedure(s): DELAYED PRIMARY CLOSURE LEFT FOREARM WOUND (Left) as a surgical intervention.  The patient's history has been reviewed, patient examined, no change in status, stable for surgery.  I have reviewed the patient's chart and labs.  Questions were answered to the patient's satisfaction.     Jolyn Nap

## 2019-05-08 ENCOUNTER — Encounter (HOSPITAL_COMMUNITY): Payer: Self-pay | Admitting: Orthopedic Surgery

## 2019-05-08 MED ORDER — JARDIANCE 10 MG PO TABS: 10.0000 mg | ORAL_TABLET | Freq: Every day | ORAL | 1 refills | Status: DC

## 2019-05-08 MED ORDER — NITROGLYCERIN 0.4 MG SL SUBL: 0.4000 mg | SUBLINGUAL_TABLET | SUBLINGUAL | 1 refills | Status: DC | PRN

## 2019-05-09 ENCOUNTER — Ambulatory Visit: Payer: Medicare Other | Admitting: Adult Health

## 2019-05-09 ENCOUNTER — Telehealth: Payer: Self-pay | Admitting: Adult Health

## 2019-05-09 NOTE — Telephone Encounter (Signed)
I will forward to Dr Gwenlyn Found and RN to have a script sent to pharmacy,  cardiology is prescribing DM medications.

## 2019-05-09 NOTE — Telephone Encounter (Signed)
Actually, I am not prescribing his diabetes medications and that this needs to be done by PCP.

## 2019-05-09 NOTE — Telephone Encounter (Signed)
[place]            Patient is calling in for ACCU CHECK  [facility], his has quit, want it called in at [place]. Pls advise

## 2019-05-09 NOTE — Telephone Encounter (Signed)
Pt number on file does not work/disconnected. lmtcb on Bettey Mare number

## 2019-05-09 NOTE — Progress Notes (Deleted)
Cardiology Office Note   Date:  05/09/2019   ID:  Lennie Hummer, DOB 1951-01-29, MRN [id number]  PCP:  Patient, No Pcp Per  Cardiologist:  Dr. Allyson Sabal  No chief complaint on file.    History of Present Illness: Matthew Moses is a 68 y.o. male who presents for post hospital follow up after admission for NSTEMI with known history of multivessel CAD., with CABG in 2012 (LIMA to LAD, SVG to LPDA). He had been having daily chest pain which was significantly impacting his ability to do daily activities. He was scheduled for a cardiac cath in January of 2020 but left the hospital AMA before having the procedure. Due to return of severe chest pain he called EMS and was taken to ED on 04/30/2019.  He was also noted to be in CHF and treated with IV lasix. Other history includes DM and VT arrest.   He subsequently had cardiac cath which revealed severe multivessel native CAD, with patent LIMA to LAD and severe in-stent restenosis and proximal stenosis of SVG to LPDA.  This was treated successfully with a scoring balloon PTCA and DES PCI of SVG to LPDA.  He had severely elevated LVEDP per cath note. He was placed on DAPT with ASA and Brilinta along with high intensity Atorvastatin 80 mg daily. He was to continue metoprolol and lasix po.   Post cath he had TR band hemotoma which was controlled initially with manually with BP cuff placed proximal to the TR band.   He subsequently was seen by hand surgeon for possible decompressive fasciotomy and possible arterial repair.  He had procedure on 05/07/2019 by Dr. Mack Hook .                                                                                                                                                                Past Medical History:  Diagnosis Date  . CAD, multiple vessel    Severe native CAD involving left main, LAD, RCA, ramus intermedius and PDA -> status post CABG x2 (LIMA-LAD, SVG-L PDA)  . Coronary artery disease involving coronary  bypass graft of native heart with angina pectoris (HCC) - thrombotic 95% oclusion of SVG-LPDA 04/03/2018   occluded LAD, subtotally occluded nondominant to codominant RCA, LCx & RI.  LIMA-LAD was intact. CULPRIT LESION was the SVG-dLPLA that had a high-grade proximal thrombotic stenosis.  S/P successful PCI of SVG-LPDA stented his vein graft with a 3.5 mm drug-eluting stent using distal protection.  [name] Kitchen Headache     " from nitro"   . Hx of CABG   . Hyperlipidemia associated with type 2 diabetes mellitus (HCC)   . Hypertension   . Myocardial infarction (HCC)   . Open wound of left forearm   . Stroke (HCC)   .  Tobacco abuse   . Type II diabetes mellitus with complication (HCC)    History of stroke and CAD-CABG  . Wears dentures     Past Surgical History:  Procedure Laterality Date  . CORONARY ARTERY BYPASS GRAFT    . CORONARY STENT INTERVENTION N/A 04/30/2019   Procedure: CORONARY STENT INTERVENTION;  Surgeon: Leonie Man, MD;  Location: [place] [facility];  Service: Cardiovascular;  Laterality: N/A;  . CORONARY/GRAFT ACUTE MI REVASCULARIZATION N/A 04/02/2018   Procedure: Coronary/Graft Acute MI Revascularization;  Surgeon: Lorretta Harp, MD;  Location: [place] [facility];  Service: Cardiovascular: Thrombotic 95% SVG-L RUE:AVWU - thrombectomy -> DES PCI Synergy DES 3.5 x 20 (3.9 mm)  . FASCIOTOMY Left 04/30/2019   Procedure: FASCIOTOMY AND  ARTERY REPAIR;  Surgeon: Milly Jakob, MD;  Location: [place];  Service: Orthopedics;  Laterality: Left;  . KNEE SURGERY    . LEFT HEART CATH AND CORS/GRAFTS ANGIOGRAPHY N/A 04/02/2018   Procedure: LEFT HEART CATH AND CORS/GRAFTS ANGIOGRAPHY;  Surgeon: Lorretta Harp, MD;  Location: [facility];  Service: Cardiovascular: Ostial Left Main 50%; distal Left Main-pLAD 100% - d(apical)LAD 95 (after patent LIMA-LAD).OstRI - 99%; ost-mLCx 95%, dCx 95% & OM2 95%; ost-pRCA (co-dominant) 99%mRCA 80%; SVG-LPDA 99% ostial thrombosis; EF 30% with a EDP 31  mmHg  . LEFT HEART CATH AND CORS/GRAFTS ANGIOGRAPHY N/A 04/30/2019   Procedure: LEFT HEART CATH AND CORS/GRAFTS ANGIOGRAPHY;  Surgeon: Leonie Man, MD;  Location: [place] [facility];  Service: Cardiovascular;  Laterality: N/A;  . MULTIPLE TOOTH EXTRACTIONS    . MUSCLE REPAIR    . SECONDARY CLOSURE OF WOUND Left 05/07/2019   Procedure: DELAYED PRIMARY CLOSURE LEFT FOREARM WOUND;  Surgeon: Milly Jakob, MD;  Location: [place];  Service: Orthopedics;  Laterality: Left;  . TONSILLECTOMY       Current Outpatient Medications  Medication Sig Dispense Refill  . acetaminophen (TYLENOL) 325 MG tablet Take 2 tablets (650 mg total) by mouth every 6 (six) hours.    [name] Kitchen aspirin EC 81 MG EC tablet Take 1 tablet (81 mg total) by mouth daily.    [name] Kitchen atorvastatin (LIPITOR) 80 MG tablet Take 1 tablet (80 mg total) by mouth daily at 6 PM. 90 tablet 1  . BRILINTA 90 MG TABS tablet TAKE 1 TABLET(90 MG TOTAL) BY MOUTH TWICE DAILY (Patient taking differently: Take 90 mg by mouth 2 (two) times daily. ) 180 tablet 0  . empagliflozin (JARDIANCE) 10 MG TABS tablet Take 10 mg by mouth daily. 90 tablet 1  . furosemide (LASIX) 40 MG tablet Take 1 tablet (40 mg total) by mouth daily. 60 tablet 3  . Lancets (ONETOUCH ULTRASOFT) lancets Use as instructed 100 each 12  . metFORMIN (GLUCOPHAGE) 500 MG tablet Take 1 tablet (500 mg total) by mouth 2 (two) times daily with a meal. 180 tablet 4  . metoprolol tartrate (LOPRESSOR) 25 MG tablet Take 0.5 tablets (12.5 mg total) by mouth 2 (two) times daily. 120 tablet 2  . nitroGLYCERIN (NITROSTAT) 0.4 MG SL tablet Place 1 tablet (0.4 mg total) under the tongue every 5 (five) minutes as needed. 75 tablet 1  . ondansetron (ZOFRAN) 4 MG tablet Take 4 mg by mouth every 8 (eight) hours as needed for nausea or vomiting.    [name] Kitchen oxyCODONE (ROXICODONE) 5 MG immediate release tablet Take 1 tablet (5 mg total) by mouth every 6 (six) hours as needed for severe pain. 20 tablet 0  . traMADol (ULTRAM)  50 MG tablet Take 1 tablet (50 mg total) by mouth every 6 (six) hours as needed. 20 tablet 0  . valsartan (DIOVAN) 40 MG tablet Take 1 tablet (40 mg total) by mouth daily. (Patient not taking: Reported on 12/21/2018) 30 tablet 2   No current facility-administered medications for this visit.     Allergies:   Penicillins and Codeine    Social History:  The patient  reports that he has been smoking cigarettes. He has been smoking about 1.00 pack per day. He has never used smokeless tobacco. He reports current drug use. Drug: Marijuana. He reports that he does not drink alcohol.   Family History:  The patient's family history includes CAD in his mother.    ROS: All other systems are reviewed and negative. Unless otherwise mentioned in H&P    PHYSICAL EXAM: VS:  There were no vitals taken for this visit. , BMI There is no height or weight on file to calculate BMI. GEN: Well nourished, well developed, in no acute distress HEENT: normal Neck: no JVD, carotid bruits, or masses Cardiac: ***RRR; no murmurs, rubs, or gallops,no edema  Respiratory:  Clear to auscultation bilaterally, normal work of breathing GI: soft, nontender, nondistended, + BS MS: no deformity or atrophy Skin: warm and dry, no rash Neuro:  Strength and sensation are intact Psych: euthymic mood, full affect   EKG:  EKG {ACTION; IS/IS [id number]} ordered today. The ekg ordered today demonstrates ***   Recent Labs: 05/23/2018: TSH 2.900 12/21/2018: ALT 27; B Natriuretic Peptide 55.1 05/01/2019: BUN 15; Creatinine, Ser 1.05; Hemoglobin 14.7; Platelets 184; Potassium 5.1; Sodium 133    Lipid Panel    Component Value Date/Time   CHOL 161 12/22/2018 0236   TRIG 229 (H) 12/22/2018 0236   HDL 36 (L) 12/22/2018 0236   CHOLHDL 4.5 12/22/2018 0236   VLDL 46 (H) 12/22/2018 0236   LDLCALC 79 12/22/2018 0236      Wt Readings from Last 3 Encounters:  05/07/19 247 lb (112 kg)  05/01/19 247 lb 9.2 oz (112.3 kg)  12/21/18  238 lb 1.6 oz (108 kg)      Other studies Reviewed: SUMMARY:  Severe Multivessel Native CAD wtih Patent LIMA-LAD & severe In-stent Re-stenosis & proximal stenosis of SVG-LPDA  Successful Scoring Balloon PTCA & DES PCI of SVG-LPDA (Synergy DES 3.5 mm x 20 mm - post-dilated to 4.1 mm)  Severely Elevated LVEDP (LV Gram not performed to conserve contrast) -- with known reduced EF - 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  ASSESSMENT AND PLAN:  1.  ***   Current medicines are reviewed at length with the patient today.    Labs/ tests ordered today include: *** Bettey Mare. Liborio Nixon, ANP, AACC   05/09/2019 7:09 AM    [facility] [address] Office [phone] Fax [phone]

## 2019-05-14 NOTE — Telephone Encounter (Signed)
Left message to call back  

## 2019-05-15 ENCOUNTER — Telehealth: Payer: Self-pay | Admitting: *Deleted

## 2019-05-15 NOTE — Telephone Encounter (Signed)
Called to confirm 05/17/19 appointment----patient was not available and no voice mail

## 2019-05-16 NOTE — Progress Notes (Deleted)
Cardiology Office Note   Date:  05/16/2019   ID:  Matthew Moses, DOB 24-May-1951, MRN [id number]  PCP:  Patient, No Pcp Per  Cardiologist:  Dr.Berry Chief complaint: Posthospitalization follow-up  NO-SHOW    History of Present Illness: Matthew Moses is a 68 y.o. male who presents for posthospitalization follow-up after admission for worsening chest pain since midnight on the day of presentation.  Patient has a known history of coronary artery disease status post CABG in 2012, hypertension, hyperlipidemia, multiple MIs, VT arrest, diabetes type 2 and morbid obesity.  The patient also has a history of high-grade disease in his SVG that was stented in May 2019.  The patient subsequently had cardiac catheterization on 04/29/2019, which revealed severe multivessel native CAD with patent LIMA to LAD and severe in-stent restenosis and proximal stenosis of the SVG to L PDA.  The patient was treated successfully with the scoring balloon PTCA and DES PCI of the SVG to L PDA.  It was noted that he had severely elevated LVEDP.  He was given IV Lasix 40 mg in addition to 2 more doses on subsequent days.  The patient had significant discomfort at the site of his catheterization insertion site at the right radial access.  The patient had significant hematoma form with worsening pain and coolness in his hand.  The patient was scheduled for a decompressive PCI to me and possible arterial repair after surgical evaluation by vein and vascular service.  On 04/30/2019 the patient had a left forearm decompressive fasciotomy and left radial artery repair by Dr. Milly Jakob.  The patient again was seen by Dr. Milly Jakob as an outpatient on 05/07/2019 for follow-up and suture removal.  Postoperatively the patient was to continue ticagrelor 90 mg twice daily, low-dose metoprolol and atorvastatin, and continue 40 mg of Lasix daily.  Past Medical History:  Diagnosis Date  . CAD, multiple vessel    Severe native CAD  involving left main, LAD, RCA, ramus intermedius and PDA -> status post CABG x2 (LIMA-LAD, SVG-L PDA)  . Coronary artery disease involving coronary bypass graft of native heart with angina pectoris ([place]) - thrombotic 95% oclusion of SVG-LPDA 04/03/2018   occluded LAD, subtotally occluded nondominant to codominant RCA, LCx & RI.  LIMA-LAD was intact. CULPRIT LESION was the SVG-dLPLA that had a high-grade proximal thrombotic stenosis.  S/P successful PCI of SVG-LPDA stented his vein graft with a 3.5 mm drug-eluting stent using distal protection.  [name] Kitchen Headache     " from nitro"   . Hx of CABG   . Hyperlipidemia associated with type 2 diabetes mellitus ([place])   . Hypertension   . Myocardial infarction ([place])   . Open wound of left forearm   . Stroke ([place])   . Tobacco abuse   . Type II diabetes mellitus with complication (HCC)    History of stroke and CAD-CABG  . Wears dentures     Past Surgical History:  Procedure Laterality Date  . CORONARY ARTERY BYPASS GRAFT    . CORONARY STENT INTERVENTION N/A 04/30/2019   Procedure: CORONARY STENT INTERVENTION;  Surgeon: Leonie Man, MD;  Location: [place] [facility];  Service: Cardiovascular;  Laterality: N/A;  . CORONARY/GRAFT ACUTE MI REVASCULARIZATION N/A 04/02/2018   Procedure: Coronary/Graft Acute MI Revascularization;  Surgeon: Lorretta Harp, MD;  Location: [facility] [facility];  Service: Cardiovascular: Thrombotic 95% SVG-L [id number] - thrombectomy -> DES PCI Synergy DES 3.5 x 20 (3.9 mm)  . FASCIOTOMY Left 04/30/2019  Procedure: FASCIOTOMY AND  ARTERY REPAIR;  Surgeon: Mack Hook, MD;  [facility]: [facility];  Service: Orthopedics;  Laterality: Left;  . KNEE SURGERY    . LEFT HEART CATH AND CORS/GRAFTS ANGIOGRAPHY N/A 04/02/2018   Procedure: LEFT HEART CATH AND CORS/GRAFTS ANGIOGRAPHY;  Surgeon: [name], MD;  Location: [facility];  Service: Cardiovascular: Ostial Left Main 50%; distal Left Main-pLAD 100% - d(apical)LAD 95 (after  patent LIMA-LAD).OstRI - 99%; ost-mLCx 95%, dCx 95% & OM2 95%; ost-pRCA (co-dominant) 99%mRCA 80%; SVG-LPDA 99% ostial thrombosis; EF 30% with a EDP 31 mmHg  . LEFT HEART CATH AND CORS/GRAFTS ANGIOGRAPHY N/A 04/30/2019   Procedure: LEFT HEART CATH AND CORS/GRAFTS ANGIOGRAPHY;  Surgeon: Marykay Lex, MD;  Location: [facility];  Service: Cardiovascular;  Laterality: N/A;  . MULTIPLE TOOTH EXTRACTIONS    . MUSCLE REPAIR    . SECONDARY CLOSURE OF WOUND Left 05/07/2019   Procedure: DELAYED PRIMARY CLOSURE LEFT FOREARM WOUND;  Surgeon: Mack Hook, MD;  [facility];  Service: Orthopedics;  Laterality: Left;  . TONSILLECTOMY       Current Outpatient Medications  Medication Sig Dispense Refill  . acetaminophen (TYLENOL) 325 MG tablet Take 2 tablets (650 mg total) by mouth every 6 (six) hours.    [name] Kitchen aspirin EC 81 MG EC tablet Take 1 tablet (81 mg total) by mouth daily.    [name] Kitchen atorvastatin (LIPITOR) 80 MG tablet Take 1 tablet (80 mg total) by mouth daily at 6 PM. 90 tablet 1  . BRILINTA 90 MG TABS tablet TAKE 1 TABLET(90 MG TOTAL) BY MOUTH TWICE DAILY (Patient taking differently: Take 90 mg by mouth 2 (two) times daily. ) 180 tablet 0  . empagliflozin (JARDIANCE) 10 MG TABS tablet Take 10 mg by mouth daily. 90 tablet 1  . furosemide (LASIX) 40 MG tablet Take 1 tablet (40 mg total) by mouth daily. 60 tablet 3  . Lancets (ONETOUCH ULTRASOFT) lancets Use as instructed 100 each 12  . metFORMIN (GLUCOPHAGE) 500 MG tablet Take 1 tablet (500 mg total) by mouth 2 (two) times daily with a meal. 180 tablet 4  . metoprolol tartrate (LOPRESSOR) 25 MG tablet Take 0.5 tablets (12.5 mg total) by mouth 2 (two) times daily. 120 tablet 2  . nitroGLYCERIN (NITROSTAT) 0.4 MG SL tablet Place 1 tablet (0.4 mg total) under the tongue every 5 (five) minutes as needed. 75 tablet 1  . ondansetron (ZOFRAN) 4 MG tablet Take 4 mg by mouth every 8 (eight) hours as needed for nausea or vomiting.    [name] Kitchen oxyCODONE  (ROXICODONE) 5 MG immediate release tablet Take 1 tablet (5 mg total) by mouth every 6 (six) hours as needed for severe pain. 20 tablet 0  . traMADol (ULTRAM) 50 MG tablet Take 1 tablet (50 mg total) by mouth every 6 (six) hours as needed. 20 tablet 0  . valsartan (DIOVAN) 40 MG tablet Take 1 tablet (40 mg total) by mouth daily. (Patient not taking: Reported on 12/21/2018) 30 tablet 2   No current facility-administered medications for this visit.     Allergies:   Penicillins and Codeine    Social History:  The patient  reports that he has been smoking cigarettes. He has been smoking about 1.00 pack per day. He has never used smokeless tobacco. He reports current drug use. Drug: Marijuana. He reports that he does not drink alcohol.   Family History:  The patient's family history includes CAD in his mother.  ROS: All other systems are reviewed and negative. Unless otherwise mentioned in H&P    PHYSICAL EXAM: VS:  There were no vitals taken for this visit. , BMI There is no height or weight on file to calculate BMI. GEN: Well nourished, well developed, in no acute distress HEENT: normal Neck: no JVD, carotid bruits, or masses Cardiac: ***RRR; no murmurs, rubs, or gallops,no edema  Respiratory:  Clear to auscultation bilaterally, normal work of breathing GI: soft, nontender, nondistended, + BS MS: no deformity or atrophy Skin: warm and dry, no rash Neuro:  Strength and sensation are intact Psych: euthymic mood, full affect   EKG:  EKG {ACTION; IS/IS [id number]} ordered today. The ekg ordered today demonstrates ***   Recent Labs: 05/23/2018: TSH 2.900 12/21/2018: ALT 27; B Natriuretic Peptide 55.1 05/01/2019: BUN 15; Creatinine, Ser 1.05; Hemoglobin 14.7; Platelets 184; Potassium 5.1; Sodium 133    Lipid Panel    Component Value Date/Time   CHOL 161 12/22/2018 0236   TRIG 229 (H) 12/22/2018 0236   HDL 36 (L) 12/22/2018 0236   CHOLHDL 4.5 12/22/2018 0236   VLDL 46 (H)  12/22/2018 0236   LDLCALC 79 12/22/2018 0236      Wt Readings from Last 3 Encounters:  05/07/19 247 lb (112 kg)  05/01/19 247 lb 9.2 oz (112.3 kg)  12/21/18 238 lb 1.6 oz (108 kg)      Other studies Reviewed: Cardiac catheterization 04/30/2019:  SUMMARY:  Severe Multivessel Native CAD wtih Patent LIMA-LAD & severe In-stent Re-stenosis & proximal stenosis of SVG-LPDA  Successful Scoring Balloon PTCA & DES PCI of SVG-LPDA (Synergy DES 3.5 mm x 20 mm - post-dilated to 4.1 mm)  Severely Elevated LVEDP (LV Gram not performed to conserve contrast) -- with known reduced EF - 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 RECOMMENDATIONS  Will monitor in PACU Holding area for ~1 hr prior to returning to Nursing Unit  Begin deflation of BP cuff by 10 mmHg every 10 min after 20 min (start @ 1510)  Gave 1 dose IV Lasix 40 mg in Cath Lab - will write for 2 additional doses tonight & in AM.  Anticipate potential d/c tomorrow if stable  Continue RF management & DAPT  ASSESSMENT AND PLAN:  1.  ***   Current medicines are reviewed at length with the patient today.    Labs/ tests ordered today include: *** Bettey Mare. Liborio Nixon, ANP, AACC   05/16/2019 7:21 PM    [facility] [address] Office [phone] Fax [phone]

## 2019-05-17 ENCOUNTER — Ambulatory Visit: Payer: Medicare Other | Admitting: Adult Health

## 2019-05-23 NOTE — Telephone Encounter (Signed)
I called pt, no answer, no voicemail.

## 2019-05-24 ENCOUNTER — Ambulatory Visit (INDEPENDENT_AMBULATORY_CARE_PROVIDER_SITE_OTHER): Payer: Medicare Other | Admitting: Adult Health

## 2019-05-24 ENCOUNTER — Encounter: Payer: Self-pay | Admitting: Adult Health

## 2019-05-24 ENCOUNTER — Other Ambulatory Visit: Payer: Self-pay

## 2019-05-24 VITALS — BP 134/79 | HR 57 | Ht 73.0 in | Wt 245.2 lb

## 2019-05-24 DIAGNOSIS — E7800 Pure hypercholesterolemia, unspecified: Secondary | ICD-10-CM | POA: Diagnosis not present

## 2019-05-24 DIAGNOSIS — I480 Paroxysmal atrial fibrillation: Secondary | ICD-10-CM | POA: Diagnosis not present

## 2019-05-24 DIAGNOSIS — E1165 Type 2 diabetes mellitus with hyperglycemia: Secondary | ICD-10-CM

## 2019-05-24 DIAGNOSIS — Z79899 Other long term (current) drug therapy: Secondary | ICD-10-CM

## 2019-05-24 DIAGNOSIS — Z794 Long term (current) use of insulin: Secondary | ICD-10-CM

## 2019-05-24 LAB — LIPID PANEL
Chol/HDL Ratio: 6.5 ratio — ABNORMAL HIGH (ref 0.0–5.0)
Cholesterol, Total: 194 mg/dL (ref 100–199)
HDL: 30 mg/dL — ABNORMAL LOW (ref >39)
LDL Calculated: 110 mg/dL — ABNORMAL HIGH (ref 0–99)
Triglycerides: 269 mg/dL — ABNORMAL HIGH (ref 0–149)
VLDL Cholesterol Cal: 54 mg/dL — ABNORMAL HIGH (ref 5–40)

## 2019-05-24 LAB — BASIC METABOLIC PANEL
BUN/Creatinine Ratio: 21 (ref 10–24)
BUN: 18 mg/dL (ref 8–27)
CO2: 23 mmol/L (ref 20–29)
Calcium: 9.6 mg/dL (ref 8.6–10.2)
Chloride: 98 mmol/L (ref 96–106)
Creatinine, Ser: 0.85 mg/dL (ref 0.76–1.27)
GFR calc Af Amer: 106 mL/min/{1.73_m2} (ref >59)
GFR calc non Af Amer: 91 mL/min/{1.73_m2} (ref >59)
Glucose: 267 mg/dL — ABNORMAL HIGH (ref 65–99)
Potassium: 5 mmol/L (ref 3.5–5.2)
Sodium: 135 mmol/L (ref 134–144)

## 2019-05-24 LAB — HEPATIC FUNCTION PANEL
ALT: 17 IU/L (ref 0–44)
AST: 14 IU/L (ref 0–40)
Albumin: 4.2 g/dL (ref 3.8–4.8)
Alkaline Phosphatase: 85 IU/L (ref 39–117)
Bilirubin Total: 0.6 mg/dL (ref 0.0–1.2)
Bilirubin, Direct: 0.14 mg/dL (ref 0.00–0.40)
Total Protein: 7 g/dL (ref 6.0–8.5)

## 2019-05-24 LAB — HEMOGLOBIN A1C
Est. average glucose Bld gHb Est-mCnc: 240 mg/dL
Hgb A1c MFr Bld: 10 % — ABNORMAL HIGH (ref 4.8–5.6)

## 2019-05-24 NOTE — Patient Instructions (Signed)
Medication Instructions:  Continue current medications  If you need a refill on your cardiac medications before your next appointment, please call your pharmacy.  Labwork: BMP, HgB A1C, Liver functions, Lipids HERE IN OUR OFFICE AT [facility]  You will need to fast. DO NOT EAT OR DRINK PAST MIDNIGHT.   Take the provided lab slips with you to the lab for your blood draw.   When you have your labs (blood work) drawn today and your tests are completely normal, you will receive your results only by MyChart Message (if you have MyChart) -OR-  A paper copy in the mail.  If you have any lab test that is abnormal or we need to change your treatment, we will call you to review these results.  Testing/Procedures: None Ordered   Follow-Up: You will need a follow up appointment in 3 months.  Please call our office 2 months in advance to schedule this appointment.  You may see Dr Gwenlyn Found or one of the following Advanced Practice Providers on your designated Care Team:    . Jory Sims, DNP, ANP     At [facility], you and your health needs are our priority.  As part of our continuing mission to provide you with exceptional heart care, we have created designated Provider Care Teams.  These Care Teams include your primary Cardiologist (physician) and Advanced Practice Providers (APPs -  Physician Assistants and Nurse Practitioners) who all work together to provide you with the care you need, when you need it.  Thank you for choosing [facility] at [facility]!!

## 2019-05-24 NOTE — Progress Notes (Signed)
Cardiology Office Note   Date:  05/24/2019   ID:  Matthew Moses, DOB [birth year], MRN [id number]  PCP:  Patient, No Pcp Per  Cardiologist:  Dr. Allyson SabalBerry Chief Complaint Post hospital follow up-coronary artery disease and left wrist hematoma   History of Present Illness: Matthew HummerDonald Isensee is a 68 y.o. male who presents for posthospitalization follow-up after admission for worsening chest pain, came to the emergency room after experiencing chest pain from about midnight on from the night before.  The patient took several sublingual nitroglycerin with no relief.  He has a known history of coronary artery disease with multiple MIs, VT arrest, and CABG in 2012.  The patient's most recent catheterization prior to admission was in May 2019 at which time he was found to have a high-grade SVG disease that was stented.  LVEF was 40% at that time.  Patient had cardiac catheterization performed which revealed patent LIMA to LAD, severe in-stent restenosis and proximal stenosis of the SVG to LP DA.  This was treated successfully with scoring balloon PTCA and drug-eluting stent PCI of the SVG to L PDA.  The patient had severely elevated LVEDP per cath note.  He was given 1 dose of IV Lasix and 40 mg additionally in 2 more doses.  Patient also had a TR band hematoma which was initially controlled manually with blood pressure cuff.The patient was referred to vascular surgery for further evaluation of right wrist hematoma.  The patient had surgical evaluation, and plans were made for surgical exploration with decompression fasciotomy and possible arterial repair.\The patient was seen by Dr. Mack Hookavid Thompson, and had a left forearm fasciotomy on 05/07/2019.  Dressing remained with follow-up post discharge.  He has been seen on follow-up by Dr. Janee Mornhompson who has signed off.  The patient is to contact him should he have any further problems with his surgical site.  The patient comes today complaining of recurrent heartburn and  frequent hiccups.  He denies any chest pain.  He is walking a mile a day and trying to his work his way up to longer distances.  He continues to have tightness and soreness in his left arm but not substantial enough to take any pain medication.  He is having some issues with the Brilinta and that it causes him to feel as if he has to take deep breaths after taking it.  It is not enough for him to wish to stop it at this time or switch.  He unfortunately continues to drink a good bit of caffeine, several Mellow Yellow's a day.  He attributes this to getting a large supply from a friend, Matthew Moses-race car driver, who is sponsored by [employer] who has given him several cases after he did some work for him in his home.  Past Medical History:  Diagnosis Date  . CAD, multiple vessel    Severe native CAD involving left main, LAD, RCA, ramus intermedius and PDA -> status post CABG x2 (LIMA-LAD, SVG-L PDA)  . Coronary artery disease involving coronary bypass graft of native heart with angina pectoris (HCC) - thrombotic 95% oclusion of SVG-LPDA 04/03/2018   occluded LAD, subtotally occluded nondominant to codominant RCA, LCx & RI.  LIMA-LAD was intact. CULPRIT LESION was the SVG-dLPLA that had a high-grade proximal thrombotic stenosis.  S/P successful PCI of SVG-LPDA stented his vein graft with a 3.5 mm drug-eluting stent using distal protection.  [name] Kitchen. Headache     " from nitro"   . Hx of CABG   .  Hyperlipidemia associated with type 2 diabetes mellitus (HCC)   . Hypertension   . Myocardial infarction (HCC)   . Open wound of left forearm   . Stroke (HCC)   . Tobacco abuse   . Type II diabetes mellitus with complication (HCC)    History of stroke and CAD-CABG  . Wears dentures     Past Surgical History:  Procedure Laterality Date  . CORONARY ARTERY BYPASS GRAFT    . CORONARY STENT INTERVENTION N/A 04/30/2019   Procedure: CORONARY STENT INTERVENTION;  Surgeon: Marykay Lex, MD;  Location: [facility]  INVASIVE CV LAB;  Service: Cardiovascular;  Laterality: N/A;  . CORONARY/GRAFT ACUTE MI REVASCULARIZATION N/A 04/02/2018   Procedure: Coronary/Graft Acute MI Revascularization;  Surgeon: [name], MD;  Location: [facility] INVASIVE CV LAB;  Service: Cardiovascular: Thrombotic 95% SVG-L IBB:CWUG - thrombectomy -> DES PCI Synergy DES 3.5 x 20 (3.9 mm)  . FASCIOTOMY Left 04/30/2019   Procedure: FASCIOTOMY AND  ARTERY REPAIR;  Surgeon: Mack Hook, MD;  Location: [facility] OR;  Service: Orthopedics;  Laterality: Left;  . KNEE SURGERY    . LEFT HEART CATH AND CORS/GRAFTS ANGIOGRAPHY N/A 04/02/2018   Procedure: LEFT HEART CATH AND CORS/GRAFTS ANGIOGRAPHY;  Surgeon: [name], MD;  Location: [facility] INVASIVE CV LAB;  Service: Cardiovascular: Ostial Left Main 50%; distal Left Main-pLAD 100% - d(apical)LAD 95 (after patent LIMA-LAD).OstRI - 99%; ost-mLCx 95%, dCx 95% & OM2 95%; ost-pRCA (co-dominant) 99%mRCA 80%; SVG-LPDA 99% ostial thrombosis; EF 30% with a EDP 31 mmHg  . LEFT HEART CATH AND CORS/GRAFTS ANGIOGRAPHY N/A 04/30/2019   Procedure: LEFT HEART CATH AND CORS/GRAFTS ANGIOGRAPHY;  Surgeon: Marykay Lex, MD;  Location: [facility] INVASIVE CV LAB;  Service: Cardiovascular;  Laterality: N/A;  . MULTIPLE TOOTH EXTRACTIONS    . MUSCLE REPAIR    . SECONDARY CLOSURE OF WOUND Left 05/07/2019   Procedure: DELAYED PRIMARY CLOSURE LEFT FOREARM WOUND;  Surgeon: Mack Hook, MD;  Location: [facility] OR;  Service: Orthopedics;  Laterality: Left;  . TONSILLECTOMY       Current Outpatient Medications  Medication Sig Dispense Refill  . acetaminophen (TYLENOL) 325 MG tablet Take 2 tablets (650 mg total) by mouth every 6 (six) hours.    [name] Kitchen aspirin EC 81 MG EC tablet Take 1 tablet (81 mg total) by mouth daily.    [name] Kitchen atorvastatin (LIPITOR) 80 MG tablet Take 1 tablet (80 mg total) by mouth daily at 6 PM. 90 tablet 1  . BRILINTA 90 MG TABS tablet TAKE 1 TABLET(90 MG TOTAL) BY MOUTH TWICE DAILY (Patient taking differently: Take 90  mg by mouth 2 (two) times daily. ) 180 tablet 0  . empagliflozin (JARDIANCE) 10 MG TABS tablet Take 10 mg by mouth daily. 90 tablet 1  . furosemide (LASIX) 40 MG tablet Take 1 tablet (40 mg total) by mouth daily. 60 tablet 3  . Lancets (ONETOUCH ULTRASOFT) lancets Use as instructed 100 each 12  . metFORMIN (GLUCOPHAGE) 500 MG tablet Take 1 tablet (500 mg total) by mouth 2 (two) times daily with a meal. 180 tablet 4  . metoprolol tartrate (LOPRESSOR) 25 MG tablet Take 0.5 tablets (12.5 mg total) by mouth 2 (two) times daily. 120 tablet 2  . nitroGLYCERIN (NITROSTAT) 0.4 MG SL tablet Place 1 tablet (0.4 mg total) under the tongue every 5 (five) minutes as needed. 75 tablet 1  . ondansetron (ZOFRAN) 4 MG tablet Take 4 mg by mouth every 8 (eight) hours as needed for nausea or vomiting.    [name] Kitchen  oxyCODONE (ROXICODONE) 5 MG immediate release tablet Take 1 tablet (5 mg total) by mouth every 6 (six) hours as needed for severe pain. 20 tablet 0  . traMADol (ULTRAM) 50 MG tablet Take 1 tablet (50 mg total) by mouth every 6 (six) hours as needed. 20 tablet 0  . valsartan (DIOVAN) 40 MG tablet Take 1 tablet (40 mg total) by mouth daily. (Patient not taking: Reported on 12/21/2018) 30 tablet 2   No current facility-administered medications for this visit.     Allergies:   Penicillins and Codeine    Social History:  The patient  reports that he has been smoking cigarettes. He has been smoking about 1.00 pack per day. He has never used smokeless tobacco. He reports current drug use. Drug: Marijuana. He reports that he does not drink alcohol.   Family History:  The patient's family history includes CAD in his mother.    ROS: All other systems are reviewed and negative. Unless otherwise mentioned in H&P    PHYSICAL EXAM: VS:  BP 134/79   Pulse (!) 57   Ht 6\' 1"  (1.854 m)   Wt 245 lb 3.2 oz (111.2 kg)   SpO2 100%   BMI 32.35 kg/m  , BMI Body mass index is 32.35 kg/m. GEN: Well nourished, well developed,  in no acute distress, obese HEENT: normal Neck: no JVD, carotid bruits, or masses Cardiac: RRR; no murmurs, rubs, or gallops,no edema  Respiratory:  Clear to auscultation bilaterally, normal work of breathing GI: soft, nontender, nondistended, + BS MS: no deformity or atrophy, left forearm surgical incision well-healed with some skin discoloration.  The patient is able to move his hand wrist and has tactile sensation.  Skin: warm and dry, no rash Neuro:  Strength and sensation are intact Psych: euthymic mood, full affect   EKG: Sinus bradycardia heart rate 57 bpm.  No abnormalities seen.  Recent Labs: 12/21/2018: ALT 27; B Natriuretic Peptide 55.1 05/01/2019: BUN 15; Creatinine, Ser 1.05; Hemoglobin 14.7; Platelets 184; Potassium 5.1; Sodium 133    Lipid Panel    Component Value Date/Time   CHOL 161 12/22/2018 0236   TRIG 229 (H) 12/22/2018 0236   HDL 36 (L) 12/22/2018 0236   CHOLHDL 4.5 12/22/2018 0236   VLDL 46 (H) 12/22/2018 0236   LDLCALC 79 12/22/2018 0236      Wt Readings from Last 3 Encounters:  05/24/19 245 lb 3.2 oz (111.2 kg)  05/07/19 247 lb (112 kg)  05/01/19 247 lb 9.2 oz (112.3 kg)      Other studies Reviewed: Cardiac catheterization 04/30/2019:  SUMMARY:  Severe Multivessel Native CAD wtih Patent LIMA-LAD & severe In-stent Re-stenosis & proximal stenosis of SVG-LPDA  Successful Scoring Balloon PTCA & DES PCI of SVG-LPDA (Synergy DES 3.5 mm x 20 mm - post-dilated to 4.1 mm)  Severely Elevated LVEDP (LV Gram not performed to conserve contrast) -- with known reduced EF - 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 RECOMMENDATIONS  Will monitor in PACU Holding area for ~1 hr prior to returning to Nursing Unit  Begin deflation of BP cuff by 10 mmHg every 10 min after 20 min (start @ 1510)  Gave 1 dose IV Lasix 40 mg in Cath Lab - will write for 2  additional doses tonight & in AM.  Anticipate potential d/c tomorrow if stable  Continue RF management & DAPT  ASSESSMENT AND  PLAN:  1.  CAD: Patient has multivessel CAD with history of CABG, recent hospitalization for NSTEMI, requiring scoring balloon PTCA and DES PCI of SVG to L PDA using Synergy DES, 2 restenosis of the proximal SVG to L PDA.  The patient continues on Brilinta 90 mg twice daily.  He is having some respiratory issues with this after taking it which subsides on its own.  He states that it is not enough to bother him at this time and does not wish to switch medications unless this worsens.  He will continue on metoprolol 12.5 mg twice daily, losartan 40 mg daily.  2.  Hypertension: Blood pressures currently controlled on medication regimen.  No changes at this time  3.  Hypercholesterolemia: Continue atorvastatin 80 mg at at bedtime.  Goal of LDL is 70 or less.  Most recent LDL was calculated at 79 on 12/22/2018.  Will need follow-up fasting lipids LFTs and BMET prior to next appointment.  4.  Left arm fasciotomy and arterial repair: In the setting of hematoma sustained during cardiac catheterization left wrist access.  He has been followed by Dr. Milly Jakob and has been released after follow-up appointment post operatively as an outpatient.  He continues to have some soreness in that area but not enough to require any pain medication at this time.  5.  Non-insulin-dependent diabetes-type II: I have advised him to stop drinking so much mellow yellow due to the high sugar content.  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  He will need to follow-up with PCP for ongoing management of diabetes.  He is currently on Jardiance 10 mg daily for cardioprotection as well.   Current medicines are reviewed at length with the patient today.    Labs/ tests ordered today include: BMET, fasting lipids and LFTs  Phill Myron. West Pugh, ANP, AACC Dyspnea  05/24/2019 8:36 AM    [facility] [address] Office [phone] Fax [phone]

## 2019-05-28 ENCOUNTER — Other Ambulatory Visit (INDEPENDENT_AMBULATORY_CARE_PROVIDER_SITE_OTHER): Payer: Medicare Other

## 2019-05-28 DIAGNOSIS — I255 Ischemic cardiomyopathy: Secondary | ICD-10-CM | POA: Diagnosis not present

## 2019-05-28 DIAGNOSIS — I480 Paroxysmal atrial fibrillation: Secondary | ICD-10-CM

## 2019-05-28 DIAGNOSIS — E7800 Pure hypercholesterolemia, unspecified: Secondary | ICD-10-CM | POA: Diagnosis not present

## 2019-05-28 DIAGNOSIS — I25709 Atherosclerosis of coronary artery bypass graft(s), unspecified, with unspecified angina pectoris: Secondary | ICD-10-CM

## 2019-05-28 DIAGNOSIS — I472 Ventricular tachycardia, unspecified: Secondary | ICD-10-CM

## 2019-05-28 DIAGNOSIS — I10 Essential (primary) hypertension: Secondary | ICD-10-CM | POA: Diagnosis not present

## 2019-05-28 DIAGNOSIS — Z9861 Coronary angioplasty status: Secondary | ICD-10-CM

## 2019-05-28 DIAGNOSIS — I2129 ST elevation (STEMI) myocardial infarction involving other sites: Secondary | ICD-10-CM

## 2019-05-28 DIAGNOSIS — I2510 Atherosclerotic heart disease of native coronary artery without angina pectoris: Secondary | ICD-10-CM

## 2019-05-30 ENCOUNTER — Telehealth: Payer: Self-pay | Admitting: Cardiovascular Disease

## 2019-05-30 ENCOUNTER — Other Ambulatory Visit: Payer: Self-pay

## 2019-05-30 NOTE — Telephone Encounter (Signed)
[id number] FOR [name] AND Parrish,Karen (EC)

## 2019-05-30 NOTE — Telephone Encounter (Signed)
Patient had stent repair a few weeks ago, patient's caregiver states he looks and feels like before he got the repair done.  She thinks it has to do with the fact that he stop using caffeine and she knows that has an affect with BRILINTA 90 MG TABS tablet.   She also stated she doesn't think he is taking all of his medications.

## 2019-05-31 NOTE — Telephone Encounter (Signed)
Attempted to contact Santiago Glad and number listed in chart. Left message on both to call back.

## 2019-05-31 NOTE — Telephone Encounter (Signed)
[id number] 

## 2019-06-04 ENCOUNTER — Ambulatory Visit (INDEPENDENT_AMBULATORY_CARE_PROVIDER_SITE_OTHER): Payer: Medicare Other | Admitting: Physician Assistant

## 2019-06-04 ENCOUNTER — Other Ambulatory Visit: Payer: Self-pay

## 2019-06-04 ENCOUNTER — Encounter: Payer: Self-pay | Admitting: Physician Assistant

## 2019-06-04 VITALS — BP 110/60 | HR 65 | Temp 97.7°F | Ht 73.0 in | Wt 242.6 lb

## 2019-06-04 DIAGNOSIS — Z9861 Coronary angioplasty status: Secondary | ICD-10-CM

## 2019-06-04 DIAGNOSIS — Z8679 Personal history of other diseases of the circulatory system: Secondary | ICD-10-CM

## 2019-06-04 DIAGNOSIS — I2510 Atherosclerotic heart disease of native coronary artery without angina pectoris: Secondary | ICD-10-CM | POA: Diagnosis not present

## 2019-06-04 DIAGNOSIS — Z8673 Personal history of transient ischemic attack (TIA), and cerebral infarction without residual deficits: Secondary | ICD-10-CM

## 2019-06-04 DIAGNOSIS — R0600 Dyspnea, unspecified: Secondary | ICD-10-CM | POA: Diagnosis not present

## 2019-06-04 DIAGNOSIS — E785 Hyperlipidemia, unspecified: Secondary | ICD-10-CM

## 2019-06-04 DIAGNOSIS — I10 Essential (primary) hypertension: Secondary | ICD-10-CM

## 2019-06-04 DIAGNOSIS — E119 Type 2 diabetes mellitus without complications: Secondary | ICD-10-CM

## 2019-06-04 MED ORDER — FUROSEMIDE 40 MG PO TABS: 40.0000 mg | ORAL_TABLET | Freq: Every day | ORAL | 3 refills | Status: DC

## 2019-06-04 MED ORDER — NITROGLYCERIN 0.4 MG SL SUBL: 0.4000 mg | SUBLINGUAL_TABLET | SUBLINGUAL | 1 refills | Status: DC | PRN

## 2019-06-04 MED ORDER — CLOPIDOGREL BISULFATE 75 MG PO TABS: 75.0000 mg | ORAL_TABLET | Freq: Every day | ORAL | 11 refills | Status: DC

## 2019-06-04 MED ORDER — JARDIANCE 10 MG PO TABS: 10.0000 mg | ORAL_TABLET | Freq: Every day | ORAL | 1 refills | Status: DC

## 2019-06-04 NOTE — Progress Notes (Signed)
Cardiology Office Note    Date:  06/06/2019   ID:  Matthew Hummeronald Stemmler, DOB 1951-05-23, MRN [id number]  PCP:  Patient, No Pcp Per  Cardiologist:  Dr. Allyson SabalBerry   Chief Complaint  Patient presents with  . Follow-up    episodic dyspnea    History of Present Illness:  Matthew Moses is a 68 y.o. male with past medical history of CAD s/p CABG in [facility] 2012, history of VT arrest, hypertension, hyperlipidemia, CVA in 06/2016, tobacco abuse, and DM2.  He had a posterior STEMI complicated by pulseless V. tach in male 2019.  Urgent cardiac catheterization showed thrombus in the SVG to OM1.  Thrombus was aspirated and a stent was placed, EF was 40%.  He was placed on amiodarone for treatment of V. tach.  He was admitted in January was unstable angina, cardiac catheterization was planned at the time however he left AMA prior to the procedure being done.  Patient was most recently admitted in June 2020 with worsening chest pain.  Cardiac catheterization performed showed multivessel CAD with patent LIMA to LAD, severe in-stent restenosis and proximal stenosis of SVG to RPDA.  This was treated with balloon angioplasty and DES to SVG to LPDA.  He did have severely elevated LVEDP and was treated with IV Lasix.  Due to enlarging hematoma in the left wrist, vascular surgery was consulted who recommended consulting hand surgery for possible upper extremity fasciotomy repair.  He unfortunately ultimately underwent left forearm fasciotomy and this was closed later on 6/15.  Patient presents today for cardiology office visit due to weakness and episodic shortness of breath.  He says this is completely new for him.  The shortness of breath does not occur with body position changes or any kind of activity.  He could be sitting there when the shortness of breath suddenly occurs.  It only last about a minute or two before resolving.  On physical exam, he does have trace amount of the lower extremity edema, however his lungs is  clear.  His heart rate is regular.  I recommended him to get an EKG before he leaves today, however he had rheumatology appointment in [place] and 45 minutes and was very eager to go.  Therefore he did not get the EKG before he left.  He did not have any new chest pain since the stent placement either.  Otherwise he says he has been compliant with his medication.  I checked with our clinical pharmacist who is unaware of any prior example of delayed reaction to the Brilinta even though his symptom is quite similar to Brilinta side effect.  Given the trace amount of edema, I do not think he is significantly volume overloaded to generate his symptoms.  I checked his blood pressure is was 110/60 so it was borderline low.  He was in a hurry to leave the clinic, I did call him back later and discussed various treatment options.  At this time I recommend we switch the Brilinta to the Plavix to see if his symptoms will improve.  I talked the case over with Dr. SwazilandJordan DOD as well..   Past Medical History:  Diagnosis Date  . CAD, multiple vessel    Severe native CAD involving left main, LAD, RCA, ramus intermedius and PDA -> status post CABG x2 (LIMA-LAD, SVG-L PDA)  . Coronary artery disease involving coronary bypass graft of native heart with angina pectoris (HCC) - thrombotic 95% oclusion of SVG-LPDA 04/03/2018   occluded LAD, subtotally  occluded nondominant to codominant RCA, LCx & RI.  LIMA-LAD was intact. CULPRIT LESION was the SVG-dLPLA that had a high-grade proximal thrombotic stenosis.  S/P successful PCI of SVG-LPDA stented his vein graft with a 3.5 mm drug-eluting stent using distal protection.  [name] Kitchen Headache     " from nitro"   . Hx of CABG   . Hyperlipidemia associated with type 2 diabetes mellitus ([place])   . Hypertension   . Myocardial infarction ([place])   . Open wound of left forearm   . Stroke ([place])   . Tobacco abuse   . Type II diabetes mellitus with complication (HCC)    History of stroke  and CAD-CABG  . Wears dentures     Past Surgical History:  Procedure Laterality Date  . CORONARY ARTERY BYPASS GRAFT    . CORONARY STENT INTERVENTION N/A 04/30/2019   Procedure: CORONARY STENT INTERVENTION;  Surgeon: Leonie Man, MD;  Location: [facility];  Service: Cardiovascular;  Laterality: N/A;  . CORONARY/GRAFT ACUTE MI REVASCULARIZATION N/A 04/02/2018   Procedure: Coronary/Graft Acute MI Revascularization;  Surgeon: Lorretta Harp, MD;  Location: [facility];  Service: Cardiovascular: Thrombotic 95% SVG-L CVE:LFYB - thrombectomy -> DES PCI Synergy DES 3.5 x 20 (3.9 mm)  . FASCIOTOMY Left 04/30/2019   Procedure: FASCIOTOMY AND  ARTERY REPAIR;  Surgeon: Milly Jakob, MD;  Location: [facility];  Service: Orthopedics;  Laterality: Left;  . KNEE SURGERY    . LEFT HEART CATH AND CORS/GRAFTS ANGIOGRAPHY N/A 04/02/2018   Procedure: LEFT HEART CATH AND CORS/GRAFTS ANGIOGRAPHY;  Surgeon: Lorretta Harp, MD;  Location: [facility];  Service: Cardiovascular: Ostial Left Main 50%; distal Left Main-pLAD 100% - d(apical)LAD 95 (after patent LIMA-LAD).OstRI - 99%; ost-mLCx 95%, dCx 95% & OM2 95%; ost-pRCA (co-dominant) 99%mRCA 80%; SVG-LPDA 99% ostial thrombosis; EF 30% with a EDP 31 mmHg  . LEFT HEART CATH AND CORS/GRAFTS ANGIOGRAPHY N/A 04/30/2019   Procedure: LEFT HEART CATH AND CORS/GRAFTS ANGIOGRAPHY;  Surgeon: Leonie Man, MD;  Location: [facility];  Service: Cardiovascular;  Laterality: N/A;  . MULTIPLE TOOTH EXTRACTIONS    . MUSCLE REPAIR    . SECONDARY CLOSURE OF WOUND Left 05/07/2019   Procedure: DELAYED PRIMARY CLOSURE LEFT FOREARM WOUND;  Surgeon: Milly Jakob, MD;  Location: [facility];  Service: Orthopedics;  Laterality: Left;  . TONSILLECTOMY      Current Medications: Outpatient Medications Prior to Visit  Medication Sig Dispense Refill  . acetaminophen (TYLENOL) 325 MG tablet Take 2 tablets (650 mg total) by mouth every 6 (six) hours.    [name] Kitchen aspirin  EC 81 MG EC tablet Take 1 tablet (81 mg total) by mouth daily.    [name] Kitchen atorvastatin (LIPITOR) 80 MG tablet Take 1 tablet (80 mg total) by mouth daily at 6 PM. 90 tablet 1  . Lancets (ONETOUCH ULTRASOFT) lancets Use as instructed 100 each 12  . metoprolol tartrate (LOPRESSOR) 25 MG tablet Take 0.5 tablets (12.5 mg total) by mouth 2 (two) times daily. 120 tablet 2  . ondansetron (ZOFRAN) 4 MG tablet Take 4 mg by mouth every 8 (eight) hours as needed for nausea or vomiting.    . traMADol (ULTRAM) 50 MG tablet Take 1 tablet (50 mg total) by mouth every 6 (six) hours as needed. 20 tablet 0  . valsartan (DIOVAN) 40 MG tablet Take 1 tablet (40 mg total) by mouth daily. 30 tablet 2  . BRILINTA 90 MG TABS tablet TAKE 1 TABLET(90  MG TOTAL) BY MOUTH TWICE DAILY (Patient taking differently: Take 90 mg by mouth 2 (two) times daily. ) 180 tablet 0  . empagliflozin (JARDIANCE) 10 MG TABS tablet Take 10 mg by mouth daily. 90 tablet 1  . furosemide (LASIX) 40 MG tablet Take 1 tablet (40 mg total) by mouth daily. 60 tablet 3  . metFORMIN (GLUCOPHAGE) 500 MG tablet Take 1 tablet (500 mg total) by mouth 2 (two) times daily with a meal. 180 tablet 4  . nitroGLYCERIN (NITROSTAT) 0.4 MG SL tablet Place 1 tablet (0.4 mg total) under the tongue every 5 (five) minutes as needed. 75 tablet 1  . oxyCODONE (ROXICODONE) 5 MG immediate release tablet Take 1 tablet (5 mg total) by mouth every 6 (six) hours as needed for severe pain. 20 tablet 0   No facility-administered medications prior to visit.      Allergies:   Penicillins and Codeine   Social History   Socioeconomic History  . Marital status: Single    Spouse name: Not on file  . Number of children: Not on file  . Years of education: Not on file  . Highest education level: Not on file  Occupational History  . Not on file  Social Needs  . Financial resource strain: Not on file  . Food insecurity    Worry: Not on file    Inability: Not on file  . Transportation  needs    Medical: Not on file    Non-medical: Not on file  Tobacco Use  . Smoking status: Current Every Day Smoker    Packs/day: 1.00    Types: Cigarettes  . Smokeless tobacco: Never Used  Substance and Sexual Activity  . Alcohol use: No  . Drug use: Yes    Types: Marijuana  . Sexual activity: Not on file  Lifestyle  . Physical activity    Days per week: Not on file    Minutes per session: Not on file  . Stress: Not on file  Relationships  . Social Musician on phone: Not on file    Gets together: Not on file    Attends religious service: Not on file    Active member of club or organization: Not on file    Attends meetings of clubs or organizations: Not on file    Relationship status: Not on file  Other Topics Concern  . Not on file  Social History Narrative  . Not on file     Family History:  The patient's family history includes CAD in his mother.   ROS:   Please see the history of present illness.    ROS All other systems reviewed and are negative.   PHYSICAL EXAM:   VS:  BP 110/60   Pulse 65   Temp 97.7 F (36.5 C)   Ht 6\' 1"  (1.854 m)   Wt 242 lb 9.6 oz (110 kg)   BMI 32.01 kg/m    GEN: Well nourished, well developed, in no acute distress  HEENT: normal  Neck: no JVD, carotid bruits, or masses Cardiac: RRR; no murmurs, rubs, or gallops. Trace edema  Respiratory:  clear to auscultation bilaterally, normal work of breathing GI: soft, nontender, nondistended, + BS MS: no deformity or atrophy  Skin: warm and dry, no rash Neuro:  Alert and Oriented x 3, Strength and sensation are intact Psych: euthymic mood, full affect  Wt Readings from Last 3 Encounters:  06/04/19 242 lb 9.6 oz (110 kg)  05/24/19  245 lb 3.2 oz (111.2 kg)  05/07/19 247 lb (112 kg)      Studies/Labs Reviewed:   EKG:  EKG is not ordered today.   Recent Labs: 12/21/2018: B Natriuretic Peptide 55.1 05/01/2019: Hemoglobin 14.7; Platelets 184 05/24/2019: ALT 17; BUN 18;  Creatinine, Ser 0.85; Potassium 5.0; Sodium 135   Lipid Panel    Component Value Date/Time   CHOL 194 05/24/2019 0850   TRIG 269 (H) 05/24/2019 0850   HDL 30 (L) 05/24/2019 0850   CHOLHDL 6.5 (H) 05/24/2019 0850   CHOLHDL 4.5 12/22/2018 0236   VLDL 46 (H) 12/22/2018 0236   LDLCALC 110 (H) 05/24/2019 0850    Additional studies/ records that were reviewed today include:   Cath 04/30/2019  CULPRIT LESION: SVG-RPDA/PL graft was visualized by angiography and is large.  Origin to Prox Graft lesion is 70% stenosed PRIOR TO PREVIOUS STENT & Prox Graft STENT is 95% stenosed.  A drug-eluting stent was successfully placed extending proximal to the prior stent and halfway into the original stent using a STENT SYNERGY DES 3.5X20. This stent and the medial segment of the previous stent were postdilated to 4.1 mm  Post intervention, there is a 0% residual stenosis.  ---------------------------------------------  Ost LM to Dist LM lesion is 50% stenosed. Dist LM to Prox LAD lesion is 100% stenosed.  Ost Cx to Mid Cx lesion is 95% stenosed. Mid Cx to Dist Cx lesion is 95% stenosed. Ost 2nd Mrg to 2nd Mrg lesion is 95% stenosed.  Small "Co-dominant" RCA: Ost RCA to Prox RCA lesion is 99% stenosed. Mid RCA lesion is 80% stenosed.  Ost Ramus to Ramus lesion is 99% stenosed.  LIMA-LAD graft was visualized by non-selective angiography and is normal in caliber. The graft exhibits no disease. Dist LAD lesion is 95% stenosed.  ---------------------------------------------  LV end diastolic pressure is severely elevated.  Prox Graft lesion is 95% stenosed.   SUMMARY:  Severe Multivessel Native CAD wtih Patent LIMA-LAD & severe In-stent Re-stenosis & proximal stenosis of SVG-LPDA  Successful Scoring Balloon PTCA & DES PCI of SVG-LPDA (Synergy DES 3.5 mm x 20 mm - post-dilated to 4.1 mm)  Severely Elevated LVEDP (LV Gram not performed to conserve contrast) -- with known reduced EF - ACUTE ON  CHRONIC COMBINED SYSTOLIC & DIASTOLIC CHF  Post TR Band hematoma - controlled with manual pressure & BP cuff placed promimally to the TR Band.   RECOMMENDATIONS  Will monitor in PACU Holding area for ~1 hr prior to returning to Nursing Unit  Begin deflation of BP cuff by 10 mmHg every 10 min after 20 min (start @ 1510)  Gave 1 dose IV Lasix 40 mg in Cath Lab - will write for 2 additional doses tonight & in AM.  Anticipate potential d/c tomorrow if stable  Continue RF management & DAPT   ASSESSMENT:    1. Dyspnea, unspecified type   2. CAD S/P percutaneous coronary angioplasty   3. H/O ventricular fibrillation   4. Essential hypertension   5. Hyperlipidemia LDL goal <70   6. H/O: CVA (cerebrovascular accident)   7. Controlled type 2 diabetes mellitus without complication, without long-term current use of insulin (HCC)      PLAN:  In order of problems listed above:  1. Episodic dyspnea: He only has trace amount of lower extremity edema, I do not think he is significantly volume overloaded.  Dyspnea does not occur necessarily with exertion and can occur at any time.  He does not have  any orthopnea or PND.  The dyspnea he described is most consistent with side effect from Brilinta, however I cannot explain why he did not have any symptom on the Brilinta last year and only started having the symptoms recently.  I discussed the case with DOD, we plan to transition him to Plavix as a trial to see if his symptoms would improve.  He is aware that if his lower extremity edema become more noticeable, he can take additional dose of Lasix in the afternoon to try to get rid of the excess fluid.  2. CAD s/p CABG: Recently underwent balloon angioplasty and DES to LPDA.  On aspirin and Brilinta, however given the recent episodic shortness of breath, we will do a trial switching the Brilinta to the Plavix.  I discussed the case with DOD Dr. Swaziland as well.  He denies any further chest pain since  the recent stent placement and also has been compliant with the dual antiplatelet therapy since.  3. History of VT arrest: No recurrence  4. Hypertension: Blood pressure stable.  5. Hyperlipidemia: On Lipitor 80 mg daily.  6. H/o CVA: Continue aspirin and statin  7. DM 2: Managed by primary care provider    Medication Adjustments/Labs and Tests Ordered: Current medicines are reviewed at length with the patient today.  Concerns regarding medicines are outlined above.  Medication changes, Labs and Tests ordered today are listed in the Patient Instructions below. Patient Instructions  Medication Instructions:   STOP BRILINTA  START PLAVIX 75 MG- 1ST DAY TAKE 300 MG (4 TABLETS) ON SECOND DAY AND AFTERWARDS TAKE 1 TABLET DAILY  If you need a refill on your cardiac medications before your next appointment, please call your pharmacy.   Lab work:  NONE ordered at this time of appointment   If you have labs (blood work) drawn today and your tests are completely normal, you will receive your results only by: [name] Kitchen MyChart Message (if you have MyChart) OR . A paper copy in the mail If you have any lab test that is abnormal or we need to change your treatment, we will call you to review the results.  Testing/Procedures:  NONE ordered at this time of appointment   Follow-Up: At [facility], you and your health needs are our priority.  As part of our continuing mission to provide you with exceptional heart care, we have created designated Provider Care Teams.  These Care Teams include your primary Cardiologist (physician) and Advanced Practice Providers (APPs -  Physician Assistants and Nurse Practitioners) who all work together to provide you with the care you need, when you need it. . You will need a follow up appointment in 3 weeks with Nanetta Batty, MD for reassessment   Any Other Special Instructions Will Be Listed Below (If Applicable).       Ramond Dial, [place]  06/06/2019  4:27 PM    [facility] [address] [place], [place], [place]  [address] Phone: [phone]; Fax: [phone]

## 2019-06-05 NOTE — Patient Instructions (Signed)
Medication Instructions:   STOP BRILINTA  START PLAVIX 75 MG- 1ST DAY TAKE 300 MG (4 TABLETS) ON SECOND DAY AND AFTERWARDS TAKE 1 TABLET DAILY  If you need a refill on your cardiac medications before your next appointment, please call your pharmacy.   Lab work:  NONE ordered at this time of appointment   If you have labs (blood work) drawn today and your tests are completely normal, you will receive your results only by: [name] Kitchen MyChart Message (if you have MyChart) OR . A paper copy in the mail If you have any lab test that is abnormal or we need to change your treatment, we will call you to review the results.  Testing/Procedures:  NONE ordered at this time of appointment   Follow-Up: At [facility], you and your health needs are our priority.  As part of our continuing mission to provide you with exceptional heart care, we have created designated Provider Care Teams.  These Care Teams include your primary Cardiologist (physician) and Advanced Practice Providers (APPs -  Physician Assistants and Nurse Practitioners) who all work together to provide you with the care you need, when you need it. . You will need a follow up appointment in 3 weeks with Matthew Burow, MD for reassessment   Any Other Special Instructions Will Be Listed Below (If Applicable).

## 2019-06-06 ENCOUNTER — Other Ambulatory Visit: Payer: Self-pay | Admitting: Cardiology

## 2019-06-06 NOTE — Telephone Encounter (Signed)
This is Dr. Berry's pt 

## 2019-06-11 ENCOUNTER — Telehealth: Payer: Self-pay | Admitting: Cardiovascular Disease

## 2019-06-11 NOTE — Telephone Encounter (Signed)
Called patient, he states for the past two days he has been sick and vomiting everything he eats. Patient denies any other symptoms no diarrhea, no cough, no fever, no cardiac symptoms. Patient advised to drink plenty of water and try to eat small meals (crackers) if not any better to go to urgent care to be evaluated.  Patient advised and verbalized understanding.

## 2019-06-11 NOTE — Telephone Encounter (Signed)
  Patient has been throwing up for the last two days and would like to know if he should go to the hospital or be seen.

## 2019-06-16 ENCOUNTER — Other Ambulatory Visit: Payer: Self-pay | Admitting: Cardiovascular Disease

## 2019-06-22 ENCOUNTER — Telehealth: Payer: Self-pay | Admitting: *Deleted

## 2019-06-22 NOTE — Telephone Encounter (Signed)
LVMTCB

## 2019-06-25 ENCOUNTER — Telehealth: Payer: Self-pay

## 2019-06-25 NOTE — Telephone Encounter (Signed)
lmtcb to update office on whether he would like 8/5 appt type changed to virtual or have appt rescheduled to and OV day for Dr. Gwenlyn Found

## 2019-06-26 LAB — ECHOCARDIOGRAM COMPLETE
Height: 73 in
Weight: 3961.23 [oz_av]

## 2019-06-26 NOTE — Telephone Encounter (Signed)
LM stating that appointment with Dr. Gwenlyn Found on 8/5 will be cancelled and will need to be rescheduled for another day when Dr. Gwenlyn Found is in office if pt would prefer OV. Asked pt to call back to reschedule.

## 2019-06-27 ENCOUNTER — Ambulatory Visit: Payer: Medicare Other | Admitting: Cardiovascular Disease

## 2019-07-19 ENCOUNTER — Other Ambulatory Visit: Payer: Self-pay | Admitting: Cardiovascular Disease

## 2019-07-19 NOTE — Telephone Encounter (Signed)
Please advise if OK to refill non cardiac medication. DM managed by PCP per last office note. Thank you!

## 2019-08-28 ENCOUNTER — Ambulatory Visit: Payer: Medicare Other | Admitting: Cardiovascular Disease

## 2019-09-05 NOTE — Telephone Encounter (Signed)
Pt evaluated on 7/2 for Non-insulin-dependent diabetes-type II

## 2019-09-20 ENCOUNTER — Telehealth: Payer: Self-pay | Admitting: Cardiovascular Disease

## 2019-09-20 NOTE — Telephone Encounter (Signed)
New Message:     Reference# [id number]   Please call [facility] from [facility], concerning pt, [name].

## 2019-09-20 NOTE — Telephone Encounter (Signed)
Spoke to representative ( christine) of [employer] was reviewing patient's  information - patient is diabetic and is noted tHAT he is not on a statin.  insurance was recommending  Dr Gwenlyn Found  Consider stating a statin.  RN REVIEWED PATIENT CHL CHART - INDICATES PATIENT IS TAKING ATORVASTATIN  80 MG - PATIENT HAS BEEN TAKING SINCE 2017   REPRESENTATIVE STATES SHE WILL NOTE IN PATIENT INFORMATION.

## 2019-11-09 ENCOUNTER — Ambulatory Visit: Payer: Medicare Other | Admitting: Cardiovascular Disease

## 2019-12-13 ENCOUNTER — Telehealth: Payer: Self-pay | Admitting: Cardiovascular Disease

## 2019-12-13 NOTE — Telephone Encounter (Signed)
New Message   Patients Primary Care is calling and states the pt is there with Chest Pain and SOB  And Diaphoretic Bp 163/95 Pulse  96  She wants to send the pt to the ER but the pt doesn't want to go without Dr Allyson Sabal advising him to do so    Please advise

## 2019-12-13 NOTE — Telephone Encounter (Signed)
Received an incoming call from the patient's PCP. The patient was currently there in the office with chest pain, shortness of breath and diaphoresis. They have called EMS and he is refusing to go to the ED. He wanted Dr. Hazle Coca office to give a recommendation. The patient has once again been advised to go to the ED. He told the PCP that he had an appointment here tomorrow with Edd Fabian, NP and could be advised then on the next steps.   The patient has been advised, through the PCP, that this office recommends that he immediately go to the ED for further assessment. If he comes to the appointment tomorrow in active chest pain, he will be sent to the ED. He has still refused to go.

## 2019-12-13 NOTE — Progress Notes (Deleted)
Cardiology Clinic Note   Patient Name: Matthew Moses Date of Encounter: 12/13/2019  Primary Care Provider:  Patient, No Pcp Per Primary Cardiologist:  Nanetta Batty, MD  Patient Profile    Matthew Moses 69 year old male presents to the clinic today for an evaluation of his chest pain, coronary artery disease, hypertension, type 2 diabetes, and hyperlipidemia.  Past Medical History    Past Medical History:  Diagnosis Date   CAD, multiple vessel    Severe native CAD involving left main, LAD, RCA, ramus intermedius and PDA -> status post CABG x2 (LIMA-LAD, SVG-L PDA)   Coronary artery disease involving coronary bypass graft of native heart with angina pectoris (HCC) - thrombotic 95% oclusion of SVG-LPDA 04/03/2018   occluded LAD, subtotally occluded nondominant to codominant RCA, LCx & RI.  LIMA-LAD was intact. CULPRIT LESION was the SVG-dLPLA that had a high-grade proximal thrombotic stenosis.  S/P successful PCI of SVG-LPDA stented his vein graft with a 3.5 mm drug-eluting stent using distal protection.   Headache     " from nitro"    Hx of CABG    Hyperlipidemia associated with type 2 diabetes mellitus (HCC)    Hypertension    Myocardial infarction (HCC)    Open wound of left forearm    Stroke (HCC)    Tobacco abuse    Type II diabetes mellitus with complication (HCC)    History of stroke and CAD-CABG   Wears dentures    Past Surgical History:  Procedure Laterality Date   CORONARY ARTERY BYPASS GRAFT     CORONARY STENT INTERVENTION N/A 04/30/2019   Procedure: CORONARY STENT INTERVENTION;  Surgeon: Marykay Lex, MD;  Location: [facility] INVASIVE CV LAB;  Service: Cardiovascular;  Laterality: N/A;   CORONARY/GRAFT ACUTE MI REVASCULARIZATION N/A 04/02/2018   Procedure: Coronary/Graft Acute MI Revascularization;  Surgeon: [name], MD;  Location: [facility] INVASIVE CV LAB;  Service: Cardiovascular: Thrombotic 95% SVG-L WHQ:PRFF - thrombectomy -> DES PCI Synergy DES 3.5  x 20 (3.9 mm)   FASCIOTOMY Left 04/30/2019   Procedure: FASCIOTOMY AND  ARTERY REPAIR;  Surgeon: Mack Hook, MD;  Location: [facility] OR;  Service: Orthopedics;  Laterality: Left;   KNEE SURGERY     LEFT HEART CATH AND CORS/GRAFTS ANGIOGRAPHY N/A 04/02/2018   Procedure: LEFT HEART CATH AND CORS/GRAFTS ANGIOGRAPHY;  Surgeon: [name], MD;  Location: [facility] INVASIVE CV LAB;  Service: Cardiovascular: Ostial Left Main 50%; distal Left Main-pLAD 100% - d(apical)LAD 95 (after patent LIMA-LAD).OstRI - 99%; ost-mLCx 95%, dCx 95% & OM2 95%; ost-pRCA (co-dominant) 99%mRCA 80%; SVG-LPDA 99% ostial thrombosis; EF 30% with a EDP 31 mmHg   LEFT HEART CATH AND CORS/GRAFTS ANGIOGRAPHY N/A 04/30/2019   Procedure: LEFT HEART CATH AND CORS/GRAFTS ANGIOGRAPHY;  Surgeon: Marykay Lex, MD;  Location: [facility] INVASIVE CV LAB;  Service: Cardiovascular;  Laterality: N/A;   MULTIPLE TOOTH EXTRACTIONS     MUSCLE REPAIR     SECONDARY CLOSURE OF WOUND Left 05/07/2019   Procedure: DELAYED PRIMARY CLOSURE LEFT FOREARM WOUND;  Surgeon: Mack Hook, MD;  Location: [facility] OR;  Service: Orthopedics;  Laterality: Left;   TONSILLECTOMY      Allergies  Allergies  Allergen Reactions   Penicillins Itching and Rash    Has patient had a PCN reaction causing immediate rash, facial/tongue/throat swelling, SOB or lightheadedness with hypotension: Yes Has patient had a PCN reaction causing severe rash involving mucus membranes or skin necrosis: No Has patient had a PCN reaction that required hospitalization: No Has  patient had a PCN reaction occurring within the last 10 years: No If all of the above answers are "NO", then may proceed with Cephalosporin use.   Codeine Hives    History of Present Illness  Mr. Matthew Moses has a past medical history of coronary artery disease status post CABG in [facility] 2012, VT arrest, HTN, HLD, CVA 06/2016, tobacco abuse, and diabetes mellitus type 2.  He had a posterior STEMI that was complicated  by pulseless VT 03/2018.  An urgent cardiac catheterization showed a thrombus in his SVG to OM1 graft.  The thrombus was aspirated and a stent was placed.  His EF at that time was 40%.  He was placed on amiodarone to treat his PT.  In January 2020 he was admitted with unstable angina, cardiac catheterization was planned however he left AMA prior to the procedure.  He was admitted 04/2019 with progressing chest pain.  A cardiac catheterization was performed and showed multivessel coronary artery disease with a patent LIMA-LAD, severe in-stent restenosis in proximal stenosis of his SVG to RPDA.  He was treated with balloon angioplasty in DES x1 to his SVG to L PDA.  He had severely elevated LVEDP at that time and was treated with IV Lasix.  Due to an enlarging hematoma in his left wrist vascular surgery was consulted who recommended a consultation with hand surgery for possible upper extremity fasciotomy repair.  He unfortunately underwent left forearm fasciotomy which was later closed on 6/15/ 2020.  He was last seen by Almyra Deforest, PA-C on 06/04/2019.  During that time he complained of episodes of weakness and shortness of breath.  He stated this was completely new for him and his increased work of breathing did not occur with body position changes or activity.  His shortness of breath would come on suddenly while he was sitting, last for 1-2 minutes and resolve.  He was noted to have trace amounts of lower extremity edema and his lungs were clear to auscultation.  His heart rate was regular.  An EKG was recommended however he had a follow-up rheumatology appointment in [place] that was in 45 minutes and chose not to have the EKG performed.  He did not have any new chest pain at that time.  He stated he had been compliant with his medications.  His blood pressure was 110/60.  Although his symptoms of shortness of breath were not the usual presentation of Brilinta side effect, his Brilinta was switched to Plavix  after had been discussed with Dr. Martinique who was the DOD of the day.  On 12/13/2019 he was at his PCPs office and was found to be diaphoretic, hypertensive, short of breath, and having chest pain.  His PCP advised him to present to the emergency department for further evaluation. His PCP contacted the cardiology clinic to get recommendations for Mr. Sonnenberg.  He was advised by our office to present to the emergency department however he did not.  He presents the clinic today and states***  *** denies chest pain, shortness of breath, lower extremity edema, fatigue, palpitations, melena, hematuria, hemoptysis, diaphoresis, weakness, presyncope, syncope, orthopnea, and PND.    Home Medications    Prior to Admission medications   Medication Sig Start Date End Date Taking? Authorizing Provider  acetaminophen (TYLENOL) 325 MG tablet Take 2 tablets (650 mg total) by mouth every 6 (six) hours. 05/01/19   Milly Jakob, MD  amiodarone (PACERONE) 200 MG tablet TAKE 1 TABLET DAILY 06/06/19   Quay Burow  J, MD  aspirin EC 81 MG EC tablet Take 1 tablet (81 mg total) by mouth daily. 05/02/19   Filbert Schilder, NP  atorvastatin (LIPITOR) 80 MG tablet Take 1 tablet (80 mg total) by mouth daily at 6 PM. 04/05/18   Arty Baumgartner, NP  clopidogrel (PLAVIX) 75 MG tablet Take 1 tablet (75 mg total) by mouth daily. Take 4 tablets on day 1, then 1 tablet daily thereafter. Discontinue Brilinta 06/04/19 06/03/20  Azalee Course, PA  empagliflozin (JARDIANCE) 10 MG TABS tablet Take 10 mg by mouth daily. 06/04/19   Azalee Course, PA  furosemide (LASIX) 40 MG tablet Take 1 tablet (40 mg total) by mouth daily. 06/04/19   Azalee Course, PA  Lancets [facility] ULTRASOFT) lancets Use as instructed 07/05/16   Layne Benton, NP  metFORMIN (GLUCOPHAGE) 500 MG tablet TAKE 1 TABLET(500 MG) BY MOUTH TWICE DAILY WITH A MEAL 06/06/19   Kilroy, Franky Macho K, PA-C  metoprolol tartrate (LOPRESSOR) 25 MG tablet Take 0.5 tablets (12.5 mg total) by mouth 2  (two) times daily. 05/01/19   Georgie Chard D, NP  nitroGLYCERIN (NITROSTAT) 0.4 MG SL tablet Place 1 tablet (0.4 mg total) under the tongue every 5 (five) minutes as needed. 06/04/19   Azalee Course, PA  ondansetron (ZOFRAN) 4 MG tablet Take 4 mg by mouth every 8 (eight) hours as needed for nausea or vomiting.    [provider]  traMADol (ULTRAM) 50 MG tablet Take 1 tablet (50 mg total) by mouth every 6 (six) hours as needed. 05/07/19   Mack Hook, MD  valsartan (DIOVAN) 40 MG tablet Take 1 tablet (40 mg total) by mouth daily. 04/05/18 06/04/19  Arty Baumgartner, NP    Family History    Family History  Problem Relation Age of Onset   CAD Mother        CABG 30's, died 44's   He indicated that his mother is deceased. He indicated that his father is deceased.  Social History    Social History   Socioeconomic History   Marital status: Single    Spouse name: Not on file   Number of children: Not on file   Years of education: Not on file   Highest education level: Not on file  Occupational History   Not on file  Tobacco Use   Smoking status: Current Every Day Smoker    Packs/day: 1.00    Types: Cigarettes   Smokeless tobacco: Never Used  Substance and Sexual Activity   Alcohol use: No   Drug use: Yes    Types: Marijuana   Sexual activity: Not on file  Other Topics Concern   Not on file  Social History Narrative   Not on file   Social Determinants of Health   Financial Resource Strain:    Difficulty of Paying Living Expenses: Not on file  Food Insecurity:    Worried About Running Out of Food in the Last Year: Not on file   Ran Out of Food in the Last Year: Not on file  Transportation Needs:    Lack of Transportation (Medical): Not on file   Lack of Transportation (Non-Medical): Not on file  Physical Activity:    Days of Exercise per Week: Not on file   Minutes of Exercise per Session: Not on file  Stress:    Feeling of Stress : Not on  file  Social Connections:    Frequency of Communication with Friends and Family: Not on file  Frequency of Social Gatherings with Friends and Family: Not on file   Attends Religious Services: Not on file   Active Member of Clubs or Organizations: Not on file   Attends Banker Meetings: Not on file   Marital Status: Not on file  Intimate Partner Violence:    Fear of Current or Ex-Partner: Not on file   Emotionally Abused: Not on file   Physically Abused: Not on file   Sexually Abused: Not on file     Review of Systems    General:  No chills, fever, night sweats or weight changes.  Cardiovascular:  No chest pain, dyspnea on exertion, edema, orthopnea, palpitations, paroxysmal nocturnal dyspnea. Dermatological: No rash, lesions/masses Respiratory: No cough, dyspnea Urologic: No hematuria, dysuria Abdominal:   No nausea, vomiting, diarrhea, bright red blood per rectum, melena, or hematemesis Neurologic:  No visual changes, wkns, changes in mental status. All other systems reviewed and are otherwise negative except as noted above.  Physical Exam    VS:  There were no vitals taken for this visit. , BMI There is no height or weight on file to calculate BMI. GEN: Well nourished, well developed, in no acute distress. HEENT: normal. Neck: Supple, no JVD, carotid bruits, or masses. Cardiac: RRR, no murmurs, rubs, or gallops. No clubbing, cyanosis, edema.  Radials/DP/PT 2+ and equal bilaterally.  Respiratory:  Respirations regular and unlabored, clear to auscultation bilaterally. GI: Soft, nontender, nondistended, BS + x 4. MS: no deformity or atrophy. Skin: warm and dry, no rash. Neuro:  Strength and sensation are intact. Psych: Normal affect.  Accessory Clinical Findings    ECG personally reviewed by me today- *** - No acute changes  EKG 05/24/2019 Sinus bradycardia 57 bpm  Echocardiogram 05/01/2019 IMPRESSIONS    1. The left ventricle has normal  systolic function, with an ejection fraction of 55-60%. The cavity size was normal. Left ventricular diastolic Doppler parameters are consistent with pseudonormalization.  2. Suggestive of hypokinesis of the basal and mid inferolateral walls but endocardial segments not well visualized.  3. The right ventricle has normal systolic function. The cavity was normal. There is no increase in right ventricular wall thickness. Right ventricular systolic pressure could not be assessed.  4. Left atrial size was mildly dilated.  5. The aortic valve was not well visualized.  6. There is mild dilatation of the ascending aorta measuring 37 mm.  FINDINGS  Left Ventricle: The left ventricle has normal systolic function, with an ejection fraction of 55-60%. The cavity size was normal. There is no increase in left ventricular wall thickness. Left ventricular diastolic Doppler parameters are consistent with  pseudonormalization. Normal left ventricular filling pressures Suggestive of hypokinesis of the basal and mid inferolateral walls but endocardial segments not well visualized.  Right Ventricle: The right ventricle has normal systolic function. The cavity was normal. There is no increase in right ventricular wall thickness. Right ventricular systolic pressure could not be assessed.  Left Atrium: Left atrial size was mildly dilated.  Right Atrium: Right atrial size was normal in size. Right atrial pressure is estimated at 3 mmHg.  Interatrial Septum: No atrial level shunt detected by color flow Doppler.  Pericardium: There is no evidence of pericardial effusion.  Mitral Valve: The mitral valve is normal in structure. Mitral valve regurgitation is trivial by color flow Doppler.  Tricuspid Valve: The tricuspid valve is normal in structure. Tricuspid valve regurgitation was not visualized by color flow Doppler.  Aortic Valve: The aortic valve was not  well visualized Aortic valve regurgitation was not  visualized by color flow Doppler.  Pulmonic Valve: The pulmonic valve was normal in structure. Pulmonic valve regurgitation is not visualized by color flow Doppler.  Aorta: There is mild dilatation of the ascending aorta measuring 37 mm.  Venous: The inferior vena cava measures 1.47 cm, is normal in size with greater than 50% respiratory variability.  Cardiac catheterization 04/30/2019  CULPRIT LESION: SVG-RPDA/PL graft was visualized by angiography and is large.  Origin to Prox Graft lesion is 70% stenosed PRIOR TO PREVIOUS STENT & Prox Graft STENT is 95% stenosed.  A drug-eluting stent was successfully placed extending proximal to the prior stent and halfway into the original stent using a STENT SYNERGY DES 3.5X20. This stent and the medial segment of the previous stent were postdilated to 4.1 mm  Post intervention, there is a 0% residual stenosis.  ---------------------------------------------  Ost LM to Dist LM lesion is 50% stenosed. Dist LM to Prox LAD lesion is 100% stenosed.  Ost Cx to Mid Cx lesion is 95% stenosed. Mid Cx to Dist Cx lesion is 95% stenosed. Ost 2nd Mrg to 2nd Mrg lesion is 95% stenosed.  Small "Co-dominant" RCA: Ost RCA to Prox RCA lesion is 99% stenosed. Mid RCA lesion is 80% stenosed.  Ost Ramus to Ramus lesion is 99% stenosed.  LIMA-LAD graft was visualized by non-selective angiography and is normal in caliber. The graft exhibits no disease. Dist LAD lesion is 95% stenosed.  ---------------------------------------------  LV end diastolic pressure is severely elevated.  Prox Graft lesion is 95% stenosed.   SUMMARY:  Severe Multivessel Native CAD wtih Patent LIMA-LAD & severe In-stent Re-stenosis & proximal stenosis of SVG-LPDA  Successful Scoring Balloon PTCA & DES PCI of SVG-LPDA (Synergy DES 3.5 mm x 20 mm - post-dilated to 4.1 mm)  Severely Elevated LVEDP (LV Gram not performed to conserve contrast) -- with known reduced EF - ACUTE ON CHRONIC  COMBINED SYSTOLIC & DIASTOLIC CHF  Post TR Band hematoma - controlled with manual pressure & BP cuff placed promimally to the TR Band.   Assessment & Plan   1.  Chest pain-patient developed chest pain yesterday 12/12/2018 at his PCPs office.  It was recommended the patient present to emergency department for ischemic evaluation.  His PCP called our office to consult with cardiology and was again told to present to the emergency department.  CABG x2 [place] 2012, cardiac catheterization 03/2018 with DES x1 to the SVG-OM1 graft.  The thrombus was aspirated and a stent was placed.  Cardiac catheterization 04/30/2019 severe in-stent restenosis and proximal stenosis with DES x1 to SVG-LPDA Continue aspirin 81 mg tablet daily Continue atorvastatin 80 mg tablet Continue clopidogrel 75 mg tablet daily Continue metoprolol tartrate 12.5 mg twice daily  Essential hypertension-BP today*** Continue metoprolol tartrate 12.5 mg twice daily Continue valsartan 40 mg tablet daily Heart healthy low-sodium diet Increase physical activity as tolerated  Hyperlipidemia-12/22/2018: VLDL 46 05/24/2019: Cholesterol, Total 194; HDL 30; LDL Calculated 110; Triglycerides 269 Continue atorvastatin 80 mg tablet daily Repeat lipid panel Heart healthy low-sodium high-fiber diet Smoking cessation  Diabetes mellitus type 2-A1c 10 05/24/2019. Continue Jardiance 10  Continue Metformin Followed by PCP  Disposition: Follow-up with Dr. Allyson Sabal in 3 months.  Thomasene Ripple. Lajoyce Tamura NP-C     [facility] [address] Office [phone] Fax [phone]

## 2019-12-14 ENCOUNTER — Ambulatory Visit: Payer: Medicare Other | Admitting: General Practice

## 2019-12-25 ENCOUNTER — Ambulatory Visit: Payer: Medicare Other | Admitting: Cardiovascular Disease

## 2020-02-06 ENCOUNTER — Encounter: Payer: Self-pay | Admitting: Cardiovascular Disease

## 2020-02-07 ENCOUNTER — Observation Stay (HOSPITAL_COMMUNITY)
Admission: EM | Admit: 2020-02-07 | Discharge: 2020-02-08 | Disposition: A | Discharge status: Home / Self Care | Payer: Medicare Other | Attending: Neurosurgery | Admitting: Neurosurgery

## 2020-02-07 ENCOUNTER — Emergency Department (HOSPITAL_COMMUNITY): Payer: Medicare Other

## 2020-02-07 ENCOUNTER — Other Ambulatory Visit: Payer: Self-pay

## 2020-02-07 DIAGNOSIS — R40241 Glasgow coma scale score 13-15, unspecified time: Secondary | ICD-10-CM | POA: Diagnosis not present

## 2020-02-07 DIAGNOSIS — Z955 Presence of coronary angioplasty implant and graft: Secondary | ICD-10-CM | POA: Insufficient documentation

## 2020-02-07 DIAGNOSIS — Z6831 Body mass index (BMI) 31.0-31.9, adult: Secondary | ICD-10-CM | POA: Insufficient documentation

## 2020-02-07 DIAGNOSIS — I480 Paroxysmal atrial fibrillation: Secondary | ICD-10-CM | POA: Insufficient documentation

## 2020-02-07 DIAGNOSIS — Y92410 Unspecified street and highway as the place of occurrence of the external cause: Secondary | ICD-10-CM | POA: Insufficient documentation

## 2020-02-07 DIAGNOSIS — E785 Hyperlipidemia, unspecified: Secondary | ICD-10-CM | POA: Insufficient documentation

## 2020-02-07 DIAGNOSIS — I110 Hypertensive heart disease with heart failure: Secondary | ICD-10-CM | POA: Insufficient documentation

## 2020-02-07 DIAGNOSIS — Z8673 Personal history of transient ischemic attack (TIA), and cerebral infarction without residual deficits: Secondary | ICD-10-CM | POA: Insufficient documentation

## 2020-02-07 DIAGNOSIS — Z951 Presence of aortocoronary bypass graft: Secondary | ICD-10-CM | POA: Diagnosis not present

## 2020-02-07 DIAGNOSIS — Z7902 Long term (current) use of antithrombotics/antiplatelets: Secondary | ICD-10-CM | POA: Diagnosis not present

## 2020-02-07 DIAGNOSIS — E669 Obesity, unspecified: Secondary | ICD-10-CM | POA: Diagnosis not present

## 2020-02-07 DIAGNOSIS — I5042 Chronic combined systolic (congestive) and diastolic (congestive) heart failure: Secondary | ICD-10-CM | POA: Insufficient documentation

## 2020-02-07 DIAGNOSIS — F17210 Nicotine dependence, cigarettes, uncomplicated: Secondary | ICD-10-CM | POA: Diagnosis not present

## 2020-02-07 DIAGNOSIS — Z79899 Other long term (current) drug therapy: Secondary | ICD-10-CM | POA: Insufficient documentation

## 2020-02-07 DIAGNOSIS — E1165 Type 2 diabetes mellitus with hyperglycemia: Secondary | ICD-10-CM | POA: Diagnosis not present

## 2020-02-07 DIAGNOSIS — Z7982 Long term (current) use of aspirin: Secondary | ICD-10-CM | POA: Insufficient documentation

## 2020-02-07 DIAGNOSIS — I6200 Nontraumatic subdural hemorrhage, unspecified: Secondary | ICD-10-CM

## 2020-02-07 DIAGNOSIS — S065X9A Traumatic subdural hemorrhage with loss of consciousness of unspecified duration, initial encounter: Principal | ICD-10-CM | POA: Insufficient documentation

## 2020-02-07 DIAGNOSIS — I252 Old myocardial infarction: Secondary | ICD-10-CM | POA: Diagnosis not present

## 2020-02-07 DIAGNOSIS — Z7984 Long term (current) use of oral hypoglycemic drugs: Secondary | ICD-10-CM | POA: Insufficient documentation

## 2020-02-07 DIAGNOSIS — I255 Ischemic cardiomyopathy: Secondary | ICD-10-CM | POA: Diagnosis not present

## 2020-02-07 DIAGNOSIS — I2510 Atherosclerotic heart disease of native coronary artery without angina pectoris: Secondary | ICD-10-CM | POA: Insufficient documentation

## 2020-02-07 DIAGNOSIS — E7800 Pure hypercholesterolemia, unspecified: Secondary | ICD-10-CM | POA: Diagnosis not present

## 2020-02-07 DIAGNOSIS — S065XAA Traumatic subdural hemorrhage with loss of consciousness status unknown, initial encounter: Secondary | ICD-10-CM | POA: Diagnosis present

## 2020-02-07 DIAGNOSIS — Z20822 Contact with and (suspected) exposure to covid-19: Secondary | ICD-10-CM | POA: Insufficient documentation

## 2020-02-07 DIAGNOSIS — I25810 Atherosclerosis of coronary artery bypass graft(s) without angina pectoris: Secondary | ICD-10-CM | POA: Diagnosis not present

## 2020-02-07 DIAGNOSIS — R160 Hepatomegaly, not elsewhere classified: Secondary | ICD-10-CM | POA: Diagnosis not present

## 2020-02-07 DIAGNOSIS — R52 Pain, unspecified: Secondary | ICD-10-CM

## 2020-02-07 LAB — COMPREHENSIVE METABOLIC PANEL
ALT: 19 U/L (ref 0–44)
AST: 26 U/L (ref 15–41)
Albumin: 3.5 g/dL (ref 3.5–5.0)
Alkaline Phosphatase: 67 U/L (ref 38–126)
Anion gap: 12 (ref 5–15)
BUN: 16 mg/dL (ref 8–23)
CO2: 23 mmol/L (ref 22–32)
Calcium: 8.8 mg/dL — ABNORMAL LOW (ref 8.9–10.3)
Chloride: 99 mmol/L (ref 98–111)
Creatinine, Ser: 0.94 mg/dL (ref 0.61–1.24)
GFR calc Af Amer: >60 mL/min (ref >60)
GFR calc non Af Amer: >60 mL/min (ref >60)
Glucose, Bld: 192 mg/dL — ABNORMAL HIGH (ref 70–99)
Potassium: 4.6 mmol/L (ref 3.5–5.1)
Sodium: 134 mmol/L — ABNORMAL LOW (ref 135–145)
Total Bilirubin: 1.2 mg/dL (ref 0.3–1.2)
Total Protein: 6.8 g/dL (ref 6.5–8.1)

## 2020-02-07 LAB — URINALYSIS, ROUTINE W REFLEX MICROSCOPIC
Bacteria, UA: NONE SEEN
Bilirubin Urine: NEGATIVE
Glucose, UA: >=500 mg/dL — AB
Hgb urine dipstick: NEGATIVE
Ketones, ur: 5 mg/dL — AB
Leukocytes,Ua: NEGATIVE
Nitrite: NEGATIVE
Protein, ur: NEGATIVE mg/dL
Specific Gravity, Urine: 1.036 — ABNORMAL HIGH (ref 1.005–1.030)
pH: 6 (ref 5.0–8.0)

## 2020-02-07 LAB — CBC
HCT: 45.4 % (ref 39.0–52.0)
Hemoglobin: 14.2 g/dL (ref 13.0–17.0)
MCH: 23.1 pg — ABNORMAL LOW (ref 26.0–34.0)
MCHC: 31.3 g/dL (ref 30.0–36.0)
MCV: 73.8 fL — ABNORMAL LOW (ref 80.0–100.0)
Platelets: 219 10*3/uL (ref 150–400)
RBC: 6.15 MIL/uL — ABNORMAL HIGH (ref 4.22–5.81)
RDW: 19.6 % — ABNORMAL HIGH (ref 11.5–15.5)
WBC: 10.3 10*3/uL (ref 4.0–10.5)
nRBC: 0 % (ref 0.0–0.2)

## 2020-02-07 LAB — PROTIME-INR
INR: 1 (ref 0.8–1.2)
Prothrombin Time: 12.7 seconds (ref 11.4–15.2)

## 2020-02-07 LAB — I-STAT CHEM 8, ED
BUN: 17 mg/dL (ref 8–23)
Calcium, Ion: 1.14 mmol/L — ABNORMAL LOW (ref 1.15–1.40)
Chloride: 102 mmol/L (ref 98–111)
Creatinine, Ser: 0.9 mg/dL (ref 0.61–1.24)
Glucose, Bld: 184 mg/dL — ABNORMAL HIGH (ref 70–99)
HCT: 46 % (ref 39.0–52.0)
Hemoglobin: 15.6 g/dL (ref 13.0–17.0)
Potassium: 4 mmol/L (ref 3.5–5.1)
Sodium: 137 mmol/L (ref 135–145)
TCO2: 29 mmol/L (ref 22–32)

## 2020-02-07 LAB — ETHANOL: Alcohol, Ethyl (B): <10 mg/dL (ref <10)

## 2020-02-07 LAB — LACTIC ACID, PLASMA: Lactic Acid, Venous: 1.5 mmol/L (ref 0.5–1.9)

## 2020-02-07 MED ORDER — METFORMIN HCL 500 MG PO TABS: 500.0000 mg | ORAL_TABLET | Freq: Two times a day (BID) | ORAL | Status: DC

## 2020-02-07 MED ORDER — CANAGLIFLOZIN 100 MG PO TABS: 100.0000 mg | ORAL_TABLET | Freq: Every day | ORAL | Status: DC

## 2020-02-07 MED ORDER — SODIUM CHLORIDE 0.9 % IV SOLN: 250.0000 mL | INTRAVENOUS | Status: DC | PRN

## 2020-02-07 MED ORDER — AMIODARONE HCL 200 MG PO TABS
200.0000 mg | ORAL_TABLET | Freq: Every day | ORAL | Status: DC
  Administered 2020-02-07: 200 mg via ORAL
  Filled 2020-02-07: qty 1

## 2020-02-07 MED ORDER — NITROGLYCERIN 0.4 MG SL SUBL
0.4000 mg | SUBLINGUAL_TABLET | SUBLINGUAL | Status: DC | PRN
  Filled 2020-02-07: qty 1

## 2020-02-07 MED ORDER — SODIUM CHLORIDE 0.9 % IV SOLN: INTRAVENOUS | Status: DC

## 2020-02-07 MED ORDER — FENTANYL CITRATE (PF) 100 MCG/2ML IJ SOLN
50.0000 ug | INTRAMUSCULAR | Status: DC | PRN
  Administered 2020-02-07 – 2020-02-08 (×9): 50 ug via INTRAVENOUS
  Filled 2020-02-07 (×8): qty 2

## 2020-02-07 MED ORDER — IOHEXOL 300 MG/ML  SOLN
100.0000 mL | Freq: Once | INTRAMUSCULAR | Status: AC | PRN
  Administered 2020-02-07: 100 mL via INTRAVENOUS

## 2020-02-07 MED ORDER — ONDANSETRON HCL 4 MG PO TABS: 4.0000 mg | ORAL_TABLET | Freq: Four times a day (QID) | ORAL | Status: DC | PRN

## 2020-02-07 MED ORDER — SODIUM CHLORIDE 0.9% FLUSH
3.0000 mL | Freq: Two times a day (BID) | INTRAVENOUS | Status: DC
  Administered 2020-02-07: 3 mL via INTRAVENOUS

## 2020-02-07 MED ORDER — ONDANSETRON HCL 4 MG/2ML IJ SOLN
4.0000 mg | Freq: Once | INTRAMUSCULAR | Status: AC
  Administered 2020-02-07: 19:00:00 4 mg via INTRAVENOUS
  Filled 2020-02-07: qty 2

## 2020-02-07 MED ORDER — ACETAMINOPHEN 325 MG PO TABS: 650.0000 mg | ORAL_TABLET | Freq: Four times a day (QID) | ORAL | Status: DC | PRN

## 2020-02-07 MED ORDER — ZOLPIDEM TARTRATE 5 MG PO TABS
5.0000 mg | ORAL_TABLET | Freq: Every evening | ORAL | Status: DC | PRN
  Administered 2020-02-08: 5 mg via ORAL
  Filled 2020-02-07 (×2): qty 1

## 2020-02-07 MED ORDER — HYDROMORPHONE HCL 1 MG/ML IJ SOLN: 1.0000 mg | INTRAMUSCULAR | Status: DC | PRN

## 2020-02-07 MED ORDER — ACETAMINOPHEN 650 MG RE SUPP: 650.0000 mg | Freq: Four times a day (QID) | RECTAL | Status: DC | PRN

## 2020-02-07 MED ORDER — ONDANSETRON HCL 4 MG/2ML IJ SOLN: 4.0000 mg | Freq: Four times a day (QID) | INTRAMUSCULAR | Status: DC | PRN

## 2020-02-07 MED ORDER — LEVETIRACETAM 500 MG PO TABS
500.0000 mg | ORAL_TABLET | Freq: Two times a day (BID) | ORAL | Status: DC
  Administered 2020-02-07 – 2020-02-08 (×2): 500 mg via ORAL
  Filled 2020-02-07 (×2): qty 1

## 2020-02-07 MED ORDER — OXYCODONE HCL 5 MG PO TABS
5.0000 mg | ORAL_TABLET | ORAL | Status: DC | PRN
  Administered 2020-02-08: 5 mg via ORAL
  Filled 2020-02-07: qty 1

## 2020-02-07 MED ORDER — TETANUS-DIPHTH-ACELL PERTUSSIS 5-2.5-18.5 LF-MCG/0.5 IM SUSP
0.5000 mL | Freq: Once | INTRAMUSCULAR | Status: AC
  Administered 2020-02-07: 0.5 mL via INTRAMUSCULAR
  Filled 2020-02-07: qty 0.5

## 2020-02-07 MED ORDER — SODIUM CHLORIDE 0.9% FLUSH: 3.0000 mL | INTRAVENOUS | Status: DC | PRN
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
## 2020-02-08 ENCOUNTER — Observation Stay (HOSPITAL_COMMUNITY): Payer: Medicare Other

## 2020-02-08 DIAGNOSIS — S065X9A Traumatic subdural hemorrhage with loss of consciousness of unspecified duration, initial encounter: Secondary | ICD-10-CM | POA: Diagnosis not present

## 2020-02-08 LAB — SARS CORONAVIRUS 2 (TAT 6-24 HRS): SARS Coronavirus 2: NEGATIVE

## 2020-02-08 LAB — HIV ANTIBODY (ROUTINE TESTING W REFLEX): HIV Screen 4th Generation wRfx: NONREACTIVE

## 2020-02-08 MED ORDER — LEVETIRACETAM 500 MG PO TABS: 500.0000 mg | ORAL_TABLET | Freq: Two times a day (BID) | ORAL | 0 refills | Status: DC

## 2020-02-08 NOTE — ED Notes (Signed)
Patient called out wanting a warm blanket , got patient a warm blanket and he stated he wanted something for pain, I was checking his chart to see if it was time and was attempting to fix the sheets on his stretcher for comfort. I told him I was sorry that he was still having to wait on a bed and he immediately became very agitated and stated the wanted all his "stuff " off and was leaving AMA, I attempted to calm him down and he was talking loudly states pull all this stuff off I am leaving AMA , I again ask him to let me call and talk to his doctor. I paged and spoke with Dr. Leeanne Deed and inform her of the situation. She ask to try and patient to stay and talk to the doctor when they round this am.

## 2020-02-08 NOTE — ED Notes (Signed)
Care endorsed to Maggie, RN 

## 2020-02-08 NOTE — ED Notes (Signed)
Pt verbalized understanding of discharge paperwork. Wound bandage changed and girlfriend called to pick up pt.

## 2020-02-08 NOTE — ED Notes (Signed)
Pt sleeping on cart in NAD. Breathing easy, non-labored. Equal rise and fall of chest noted. Hourly rounds completed. Call light within reach.
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
## 2020-02-08 NOTE — ED Notes (Signed)
SDU  Breakfast ordered  
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
## 2020-02-08 NOTE — ED Notes (Signed)
Pt informed he had room upstairs but would still like to leave AMA. When trying to empty his full urinal, pt stated that he would like me to leave it so the doctor and head nurse could see it.

## 2020-02-08 NOTE — ED Notes (Signed)
Pt educated that PO pain medication will have longer lasting effects as compared to IV. Pt agreeable to try PO pain meds. Oxycodone given per MAR. Name/DOB verified

## 2020-02-08 NOTE — ED Notes (Addendum)
Pt on call light c/o chest pain. Nitro offered. Pt refused nitro. Pt asking for fentanyl. Pt also refuses to be on cardiac monitor. Pt states "I am here for observation. I don't need that on." education provided
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
## 2020-02-08 NOTE — ED Notes (Signed)
Neurosurgery paged regarding pt wanting to leave AMA and refusing Head CT this am.

## 2020-02-22 ENCOUNTER — Other Ambulatory Visit: Payer: Self-pay

## 2020-02-22 MED ORDER — AMIODARONE HCL 200 MG PO TABS: ORAL_TABLET | ORAL | 4 refills | Status: DC

## 2020-02-27 ENCOUNTER — Ambulatory Visit: Payer: Medicare Other | Admitting: General Practice

## 2020-02-28 ENCOUNTER — Other Ambulatory Visit: Payer: Self-pay | Admitting: Physician Assistant

## 2020-03-21 ENCOUNTER — Ambulatory Visit: Payer: Medicare Other | Admitting: Cardiovascular Disease

## 2020-03-26 ENCOUNTER — Other Ambulatory Visit: Payer: Self-pay

## 2020-03-26 ENCOUNTER — Ambulatory Visit (INDEPENDENT_AMBULATORY_CARE_PROVIDER_SITE_OTHER): Payer: Medicare Other | Admitting: Cardiovascular Disease

## 2020-03-26 ENCOUNTER — Telehealth: Payer: Self-pay

## 2020-03-26 ENCOUNTER — Encounter: Payer: Self-pay | Admitting: Cardiovascular Disease

## 2020-03-26 VITALS — BP 128/76 | HR 65 | Ht 73.0 in | Wt 224.0 lb

## 2020-03-26 DIAGNOSIS — I10 Essential (primary) hypertension: Secondary | ICD-10-CM | POA: Diagnosis not present

## 2020-03-26 DIAGNOSIS — E7800 Pure hypercholesterolemia, unspecified: Secondary | ICD-10-CM

## 2020-03-26 DIAGNOSIS — E785 Hyperlipidemia, unspecified: Secondary | ICD-10-CM

## 2020-03-26 DIAGNOSIS — I25709 Atherosclerosis of coronary artery bypass graft(s), unspecified, with unspecified angina pectoris: Secondary | ICD-10-CM | POA: Diagnosis not present

## 2020-03-26 DIAGNOSIS — I255 Ischemic cardiomyopathy: Secondary | ICD-10-CM | POA: Diagnosis not present

## 2020-03-26 MED ORDER — ATORVASTATIN CALCIUM 80 MG PO TABS: 80.0000 mg | ORAL_TABLET | Freq: Every day | ORAL | 1 refills | Status: DC

## 2020-03-26 MED ORDER — METOPROLOL TARTRATE 25 MG PO TABS: 12.5000 mg | ORAL_TABLET | Freq: Two times a day (BID) | ORAL | 2 refills | Status: DC

## 2020-03-26 MED ORDER — METFORMIN HCL 500 MG PO TABS: ORAL_TABLET | ORAL | 0 refills | Status: DC

## 2020-03-26 MED ORDER — ASPIRIN 81 MG PO TBEC: 81.0000 mg | DELAYED_RELEASE_TABLET | Freq: Every day | ORAL | Status: DC

## 2020-03-26 MED ORDER — VALSARTAN 40 MG PO TABS: 40.0000 mg | ORAL_TABLET | Freq: Every day | ORAL | 2 refills | Status: DC

## 2020-03-26 MED ORDER — JARDIANCE 10 MG PO TABS: 10.0000 mg | ORAL_TABLET | Freq: Every day | ORAL | 1 refills | Status: DC

## 2020-03-26 NOTE — Assessment & Plan Note (Signed)
History of hyperlipidemia on high-dose statin therapy.  His last lipid profile in the chart was 05/24/2019 revealing total cholesterol 194, LDL of 79 HDL 30.  We will recheck a lipid liver profile today.

## 2020-03-26 NOTE — Assessment & Plan Note (Signed)
History of ongoing tobacco abuse of 7 cigarettes a day with the intent to hopefully stop

## 2020-03-26 NOTE — Patient Instructions (Addendum)
Medication Instructions:   WE WILL CALL REGARDING MEDICATIONS  *If you need a refill on your cardiac medications before your next appointment, please call your pharmacy*   Lab Work: Your physician recommends that you HAVE LAB WORK TODAY  If you have labs (blood work) drawn today and your tests are completely normal, you will receive your results only by: [name] Kitchen MyChart Message (if you have MyChart) OR . A paper copy in the mail If you have any lab test that is abnormal or we need to change your treatment, we will call you to review the results.   Follow-Up: At [facility], you and your health needs are our priority.  As part of our continuing mission to provide you with exceptional heart care, we have created designated Provider Care Teams.  These Care Teams include your primary Cardiologist (physician) and Advanced Practice Providers (APPs -  Physician Assistants and Nurse Practitioners) who all work together to provide you with the care you need, when you need it.  We recommend signing up for the patient portal called "MyChart".  Sign up information is provided on this After Visit Summary.  MyChart is used to connect with patients for Virtual Visits (Telemedicine).  Patients are able to view lab/test results, encounter notes, upcoming appointments, etc.  Non-urgent messages can be sent to your provider as well.   To learn more about what you can do with MyChart, go to [email or web address].    Your next appointment:    Your physician wants you to follow-up in: 6 MONTHS WITH HAO MENG PA You will receive a reminder letter in the mail two months in advance. If you don't receive a letter, please call our office to schedule the follow-up appointment.   Your physician wants you to follow-up in: 12 MONTHS WITH DR Allyson Sabal You will receive a reminder letter in the mail two months in advance. If you don't receive a letter, please call our office to schedule the follow-up appointment.

## 2020-03-26 NOTE — Telephone Encounter (Signed)
Called [facility] to establish the patient getting his medication in blister packs. Refills sent over to [facility]. Patient voiced understanding that he can pick up his medication from [facility].

## 2020-03-26 NOTE — Assessment & Plan Note (Signed)
History of essential hypertension a blood pressure measured today at 128/76.  It is unclear what medications he is actually on but his list of her antihypertensive medications include metoprolol and valsartan.
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
## 2020-03-26 NOTE — Telephone Encounter (Signed)
Called in refills at [facility] on [address]

## 2020-03-26 NOTE — Assessment & Plan Note (Signed)
History of ischemic cardiomyopathy in the past with an EF of 30% and subsequent to that 45 to 50% the most recently by 2D echo a year ago 55 to 60%.  He denies chest pain or shortness of breath.

## 2020-03-27 LAB — HEPATIC FUNCTION PANEL
ALT: 11 IU/L (ref 0–44)
AST: 12 IU/L (ref 0–40)
Albumin: 4.2 g/dL (ref 3.8–4.8)
Alkaline Phosphatase: 115 IU/L (ref 39–117)
Bilirubin Total: 0.4 mg/dL (ref 0.0–1.2)
Bilirubin, Direct: 0.11 mg/dL (ref 0.00–0.40)
Total Protein: 7.2 g/dL (ref 6.0–8.5)

## 2020-03-27 LAB — LIPID PANEL
Chol/HDL Ratio: 5.5 ratio — ABNORMAL HIGH (ref 0.0–5.0)
Cholesterol, Total: 170 mg/dL (ref 100–199)
HDL: 31 mg/dL — ABNORMAL LOW (ref >39)
LDL Chol Calc (NIH): 100 mg/dL — ABNORMAL HIGH (ref 0–99)
Triglycerides: 225 mg/dL — ABNORMAL HIGH (ref 0–149)
VLDL Cholesterol Cal: 39 mg/dL (ref 5–40)

## 2020-03-28 ENCOUNTER — Other Ambulatory Visit: Payer: Self-pay

## 2020-03-28 DIAGNOSIS — I25709 Atherosclerosis of coronary artery bypass graft(s), unspecified, with unspecified angina pectoris: Secondary | ICD-10-CM

## 2020-03-28 DIAGNOSIS — E7800 Pure hypercholesterolemia, unspecified: Secondary | ICD-10-CM

## 2020-10-03 ENCOUNTER — Ambulatory Visit (INDEPENDENT_AMBULATORY_CARE_PROVIDER_SITE_OTHER): Payer: Medicare Other | Admitting: Physician Assistant

## 2020-10-03 ENCOUNTER — Other Ambulatory Visit: Payer: Self-pay

## 2020-10-03 ENCOUNTER — Encounter: Payer: Self-pay | Admitting: Physician Assistant

## 2020-10-03 VITALS — BP 135/79 | HR 60 | Ht 73.0 in | Wt 236.0 lb

## 2020-10-03 DIAGNOSIS — E119 Type 2 diabetes mellitus without complications: Secondary | ICD-10-CM

## 2020-10-03 DIAGNOSIS — I25810 Atherosclerosis of coronary artery bypass graft(s) without angina pectoris: Secondary | ICD-10-CM

## 2020-10-03 DIAGNOSIS — E785 Hyperlipidemia, unspecified: Secondary | ICD-10-CM

## 2020-10-03 DIAGNOSIS — I10 Essential (primary) hypertension: Secondary | ICD-10-CM | POA: Diagnosis not present

## 2020-10-03 DIAGNOSIS — Z720 Tobacco use: Secondary | ICD-10-CM

## 2020-10-03 NOTE — Progress Notes (Signed)
Cardiology Office Note:    Date:  10/04/2020   ID:  Matthew Moses, DOB 10-Sep-1951, MRN [id number]  PCP:  Patient, No Pcp Per  [facility] Cardiologist:  Nanetta Batty, MD  [facility] Electrophysiologist:  None   Referring MD: No ref. provider found   Chief Complaint  Patient presents with  . Follow-up    seen for Dr. Allyson Sabal    History of Present Illness:    Matthew Moses is a 69 y.o. male with a hx of CAD s/p CABG in [facility] 2012, history of VT arrest, hypertension, hyperlipidemia, CVA in 06/2016, tobacco abuse, and DM2.  He had a posterior STEMI complicated by pulseless V. tach in male 2019.  Urgent cardiac catheterization showed thrombus in the SVG to OM1.  Thrombus was aspirated and a stent was placed, EF was 40%.  He was placed on amiodarone for treatment of V. tach.  He was admitted in January was unstable angina, cardiac catheterization was planned at the time however he left AMA prior to the procedure being done.  Patient was admitted in June 2020 with worsening chest pain.  Cardiac catheterization performed showed multivessel CAD with patent LIMA to LAD, severe in-stent restenosis and proximal stenosis of SVG to RPDA.  This was treated with balloon angioplasty and DES to SVG to LPDA.  He did have severely elevated LVEDP and was treated with IV Lasix.  Due to enlarging hematoma in the left wrist, vascular surgery was consulted who recommended consulting hand surgery for possible upper extremity fasciotomy repair.  He unfortunately ultimately underwent left forearm fasciotomy and this was closed later on 6/15. Echocardiogram obtained during the admission showed EF 55 to 60%, hypokinesis of the basal and mid inferolateral wall.  Patient presents today for follow-up.  Occasionally he still have tingling sensation and numbness in the left fingers.  Left arm at the side where he had a fasciotomy in 2020.  He does not seems to have good blood flow in the ulnar artery, however does  appear to have good blood flow in the radial artery.  I checked his pressure on both side of the arm, pressures equal on both side, therefore the tingling sensation in his fingers is unlikely due to poor circulation.  Otherwise he denies any chest pain or shortness of breath.  He can follow-up in 6 months.  Note last lipid panel obtained on 03/26/2020 showed uncontrolled triglyceride and LDL, however patient says he has made adjustment to his dietary intake for the past 6 months.  We will repeat some lab work.    Past Medical History:  Diagnosis Date  . CAD, multiple vessel    Severe native CAD involving left main, LAD, RCA, ramus intermedius and PDA -> status post CABG x2 (LIMA-LAD, SVG-L PDA)  . Coronary artery disease involving coronary bypass graft of native heart with angina pectoris (HCC) - thrombotic 95% oclusion of SVG-LPDA 04/03/2018   occluded LAD, subtotally occluded nondominant to codominant RCA, LCx & RI.  LIMA-LAD was intact. CULPRIT LESION was the SVG-dLPLA that had a high-grade proximal thrombotic stenosis.  S/P successful PCI of SVG-LPDA stented his vein graft with a 3.5 mm drug-eluting stent using distal protection.  [name] Kitchen Headache     " from nitro"   . Hx of CABG   . Hyperlipidemia associated with type 2 diabetes mellitus (HCC)   . Hypertension   . Myocardial infarction (HCC)   . Open wound of left forearm   . Stroke (HCC)   .  Tobacco abuse   . Type II diabetes mellitus with complication (HCC)    History of stroke and CAD-CABG  . Wears dentures     Past Surgical History:  Procedure Laterality Date  . CORONARY ARTERY BYPASS GRAFT    . CORONARY STENT INTERVENTION N/A 04/30/2019   Procedure: CORONARY STENT INTERVENTION;  Surgeon: Marykay Lex, MD;  Location: [facility] INVASIVE CV LAB;  Service: Cardiovascular;  Laterality: N/A;  . CORONARY/GRAFT ACUTE MI REVASCULARIZATION N/A 04/02/2018   Procedure: Coronary/Graft Acute MI Revascularization;  Surgeon: [name], MD;   Location: [facility] INVASIVE CV LAB;  Service: Cardiovascular: Thrombotic 95% SVG-L SHF:WYOV - thrombectomy -> DES PCI Synergy DES 3.5 x 20 (3.9 mm)  . FASCIOTOMY Left 04/30/2019   Procedure: FASCIOTOMY AND  ARTERY REPAIR;  Surgeon: Mack Hook, MD;  Location: [facility] OR;  Service: Orthopedics;  Laterality: Left;  . KNEE SURGERY    . LEFT HEART CATH AND CORS/GRAFTS ANGIOGRAPHY N/A 04/02/2018   Procedure: LEFT HEART CATH AND CORS/GRAFTS ANGIOGRAPHY;  Surgeon: [name], MD;  Location: [facility] INVASIVE CV LAB;  Service: Cardiovascular: Ostial Left Main 50%; distal Left Main-pLAD 100% - d(apical)LAD 95 (after patent LIMA-LAD).OstRI - 99%; ost-mLCx 95%, dCx 95% & OM2 95%; ost-pRCA (co-dominant) 99%mRCA 80%; SVG-LPDA 99% ostial thrombosis; EF 30% with a EDP 31 mmHg  . LEFT HEART CATH AND CORS/GRAFTS ANGIOGRAPHY N/A 04/30/2019   Procedure: LEFT HEART CATH AND CORS/GRAFTS ANGIOGRAPHY;  Surgeon: Marykay Lex, MD;  Location: [facility] INVASIVE CV LAB;  Service: Cardiovascular;  Laterality: N/A;  . MULTIPLE TOOTH EXTRACTIONS    . MUSCLE REPAIR    . SECONDARY CLOSURE OF WOUND Left 05/07/2019   Procedure: DELAYED PRIMARY CLOSURE LEFT FOREARM WOUND;  Surgeon: Mack Hook, MD;  Location: [facility] OR;  Service: Orthopedics;  Laterality: Left;  . TONSILLECTOMY      Current Medications: Current Meds  Medication Sig  . aspirin 81 MG EC tablet Take 1 tablet (81 mg total) by mouth at bedtime.  [name] Kitchen atorvastatin (LIPITOR) 80 MG tablet Take 1 tablet (80 mg total) by mouth daily at 6 PM.  . empagliflozin (JARDIANCE) 10 MG TABS tablet Take 10 mg by mouth daily.  . Lancets (ONETOUCH ULTRASOFT) lancets Use as instructed  . levETIRAcetam (KEPPRA) 500 MG tablet Take 1 tablet (500 mg total) by mouth 2 (two) times daily.  . metFORMIN (GLUCOPHAGE) 500 MG tablet TAKE 1 TABLET(500 MG) BY MOUTH TWICE DAILY WITH A MEAL  . metoprolol tartrate (LOPRESSOR) 25 MG tablet Take 0.5 tablets (12.5 mg total) by mouth 2 (two) times daily.  . nitroGLYCERIN  (NITROSTAT) 0.4 MG SL tablet Place 1 tablet (0.4 mg total) under the tongue every 5 (five) minutes as needed.     Allergies:   Penicillins, Codeine, and Nsaids   Social History   Socioeconomic History  . Marital status: Single    Spouse name: Not on file  . Number of children: Not on file  . Years of education: Not on file  . Highest education level: Not on file  Occupational History  . Not on file  Tobacco Use  . Smoking status: Current Every Day Smoker    Packs/day: 1.00    Types: Cigarettes  . Smokeless tobacco: Never Used  Vaping Use  . Vaping Use: Never used  Substance and Sexual Activity  . Alcohol use: No  . Drug use: Yes    Types: Marijuana  . Sexual activity: Not on file  Other Topics Concern  . Not on file  Social History Narrative  . Not on file   Social Determinants of Health   Financial Resource Strain:   . Difficulty of Paying Living Expenses: Not on file  Food Insecurity:   . Worried About Programme researcher, broadcasting/film/video in the Last Year: Not on file  . Ran Out of Food in the Last Year: Not on file  Transportation Needs:   . Lack of Transportation (Medical): Not on file  . Lack of Transportation (Non-Medical): Not on file  Physical Activity:   . Days of Exercise per Week: Not on file  . Minutes of Exercise per Session: Not on file  Stress:   . Feeling of Stress : Not on file  Social Connections:   . Frequency of Communication with Friends and Family: Not on file  . Frequency of Social Gatherings with Friends and Family: Not on file  . Attends Religious Services: Not on file  . Active Member of Clubs or Organizations: Not on file  . Attends Banker Meetings: Not on file  . Marital Status: Not on file     Family History: The patient's family history includes CAD in his mother.  ROS:   Please see the history of present illness.     All other systems reviewed and are negative.  EKGs/Labs/Other Studies Reviewed:    The following studies were  reviewed today:  Cath 04/30/2019  CULPRIT LESION: SVG-RPDA/PL graft was visualized by angiography and is large.  Origin to Prox Graft lesion is 70% stenosed PRIOR TO PREVIOUS STENT & Prox Graft STENT is 95% stenosed.  A drug-eluting stent was successfully placed extending proximal to the prior stent and halfway into the original stent using a STENT SYNERGY DES 3.5X20. This stent and the medial segment of the previous stent were postdilated to 4.1 mm  Post intervention, there is a 0% residual stenosis.  ---------------------------------------------  Ost LM to Dist LM lesion is 50% stenosed. Dist LM to Prox LAD lesion is 100% stenosed.  Ost Cx to Mid Cx lesion is 95% stenosed. Mid Cx to Dist Cx lesion is 95% stenosed. Ost 2nd Mrg to 2nd Mrg lesion is 95% stenosed.  Small "Co-dominant" RCA: Ost RCA to Prox RCA lesion is 99% stenosed. Mid RCA lesion is 80% stenosed.  Ost Ramus to Ramus lesion is 99% stenosed.  LIMA-LAD graft was visualized by non-selective angiography and is normal in caliber. The graft exhibits no disease. Dist LAD lesion is 95% stenosed.  ---------------------------------------------  LV end diastolic pressure is severely elevated.  Prox Graft lesion is 95% stenosed.   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   RECOMMENDATIONS  Will monitor in PACU Holding area for ~1 hr prior to returning to Nursing Unit  Begin deflation of BP cuff by 10 mmHg every 10 min after 20 min (start @ 1510)  Gave 1 dose IV Lasix 40 mg in Cath Lab  - will write for 2 additional doses tonight & in AM.  Anticipate potential d/c tomorrow if stable  Continue RF management & DAPT  Echo 05/01/2019 1. The left ventricle has normal systolic function, with an ejection  fraction of 55-60%. The cavity size was normal. Left ventricular diastolic  Doppler parameters are consistent with pseudonormalization.  2. Suggestive of hypokinesis of the basal and mid inferolateral walls but  endocardial segments not well visualized.  3. The right ventricle has normal systolic function. The cavity was  normal. There is no increase in right ventricular wall thickness. Right  ventricular systolic pressure could not be assessed.  4. Left atrial size was mildly dilated.  5. The aortic valve was not well visualized.  6. There is mild dilatation of the ascending aorta measuring 37 mm.    EKG:  EKG is ordered today.  The ekg ordered today demonstrates normal sinus rhythm, no significant ST-T wave changes  Recent Labs: 02/07/2020: BUN 17; Creatinine, Ser 0.90; Hemoglobin 15.6; Platelets 219; Potassium 4.0; Sodium 137 03/26/2020: ALT 11  Recent Lipid Panel    Component Value Date/Time   CHOL 170 03/26/2020 1210   TRIG 225 (H) 03/26/2020 1210   HDL 31 (L) 03/26/2020 1210   CHOLHDL 5.5 (H) 03/26/2020 1210   CHOLHDL 4.5 12/22/2018 0236   VLDL 46 (H) 12/22/2018 0236   LDLCALC 100 (H) 03/26/2020 1210     Risk Assessment/Calculations:       Physical Exam:    VS:  BP 135/79   Pulse 60   Ht 6\' 1"  (1.854 m)   Wt 236 lb (107 kg)   SpO2 98%   BMI 31.14 kg/m     Wt Readings from Last 3 Encounters:  10/03/20 236 lb (107 kg)  03/26/20 224 lb (101.6 kg)  02/07/20 240 lb (108.9 kg)     GEN:  Well nourished, well developed in no acute distress HEENT: Normal NECK: No JVD; No carotid bruits LYMPHATICS: No lymphadenopathy CARDIAC: RRR, no murmurs, rubs, gallops RESPIRATORY:  Clear to auscultation without rales, wheezing or rhonchi  ABDOMEN: Soft,  non-tender, non-distended MUSCULOSKELETAL:  No edema; No deformity  SKIN: Warm and dry NEUROLOGIC:  Alert and oriented x 3 PSYCHIATRIC:  Normal affect   ASSESSMENT:    1. Coronary artery disease involving coronary bypass graft of native heart without angina pectoris   2. Essential hypertension   3. Hyperlipidemia LDL goal <70   4. Controlled type 2 diabetes mellitus without complication, without long-term current use of insulin (HCC)   5. Tobacco abuse    PLAN:    In order of problems listed above:  1. CAD s/p CABG: Patient denies any recent chest pain.  Continue aspirin and Lipitor  2. Hypertension: Blood pressure well controlled  3. Hyperlipidemia: Previous lab work showed uncontrolled LDL and triglyceride.  Patient has made further dietary adjustment in the past 6 months.  We will repeat fasting lipid panel and CMP  4. DM2: Managed by primary care provider  5. Tobacco abuse: Unfortunately he still smokes.  Emphasis has been placed on tobacco cessation.    Shared Decision Making/Informed Consent        Medication Adjustments/Labs and Tests Ordered: Current medicines are reviewed at length with the patient today.  Concerns regarding medicines are outlined above.  Orders Placed This Encounter  Procedures  . EKG 12-Lead   No orders of the defined types were placed in this encounter.   Patient Instructions  Medication Instructions:  Continue current medications  *If you need a refill on your cardiac medications before your next appointment, please call your pharmacy*   Lab Work: Fasting Lipid and  BMP  If you have labs (blood work) drawn today and your tests are completely normal, you will receive your results only by: [name] Kitchen MyChart Message (if you have MyChart) OR . A paper copy in the mail If you have any lab test that is abnormal or we need to change your treatment, we will call you to review the results.   Testing/Procedures: None Ordered   Follow-Up: At  [facility], you and your health needs are our priority.  As part of our continuing mission to provide you with exceptional heart care, we have created designated Provider Care Teams.  These Care Teams include your primary Cardiologist (physician) and Advanced Practice Providers (APPs -  Physician Assistants and Nurse Practitioners) who all work together to provide you with the care you need, when you need it.  We recommend signing up for the patient portal called "MyChart".  Sign up information is provided on this After Visit Summary.  MyChart is used to connect with patients for Virtual Visits (Telemedicine).  Patients are able to view lab/test results, encounter notes, upcoming appointments, etc.  Non-urgent messages can be sent to your provider as well.   To learn more about what you can do with MyChart, go to [email or web address].    Your next appointment:   6 month(s)  The format for your next appointment:   In Person  Provider:   You may see Nanetta Batty, MD or one of the following Advanced Practice Providers on your designated Care Team:    [name], PA-C  Marjie Skiff, PA-C  Edd Fabian, FNP       Signed, Azalee Course, [place]  10/04/2020 1:27 AM    [facility]

## 2020-10-03 NOTE — Patient Instructions (Signed)
Medication Instructions:  Continue current medications  *If you need a refill on your cardiac medications before your next appointment, please call your pharmacy*   Lab Work: Fasting Lipid and BMP  If you have labs (blood work) drawn today and your tests are completely normal, you will receive your results only by:  MyChart Message (if you have MyChart) OR  A paper copy in the mail If you have any lab test that is abnormal or we need to change your treatment, we will call you to review the results.   Testing/Procedures: None Ordered   Follow-Up: At [facility], you and your health needs are our priority.  As part of our continuing mission to provide you with exceptional heart care, we have created designated Provider Care Teams.  These Care Teams include your primary Cardiologist (physician) and Advanced Practice Providers (APPs -  Physician Assistants and Nurse Practitioners) who all work together to provide you with the care you need, when you need it.  We recommend signing up for the patient portal called "MyChart".  Sign up information is provided on this After Visit Summary.  MyChart is used to connect with patients for Virtual Visits (Telemedicine).  Patients are able to view lab/test results, encounter notes, upcoming appointments, etc.  Non-urgent messages can be sent to your provider as well.   To learn more about what you can do with MyChart, go to [email or web address].    Your next appointment:   6 month(s)  The format for your next appointment:   In Person  Provider:   You may see Nanetta Batty, MD or one of the following Advanced Practice Providers on your designated Care Team:    [name], PA-C  [place], [place]  Edd Fabian, [place]

## 2020-10-04 ENCOUNTER — Encounter: Payer: Self-pay | Admitting: Physician Assistant

## 2020-10-04 ENCOUNTER — Other Ambulatory Visit: Payer: Self-pay | Admitting: Cardiovascular Disease

## 2020-11-04 ENCOUNTER — Ambulatory Visit: Payer: Medicare Other | Admitting: Cardiovascular Disease

## 2020-12-04 ENCOUNTER — Other Ambulatory Visit: Payer: Self-pay | Admitting: Cardiovascular Disease

## 2021-04-03 ENCOUNTER — Ambulatory Visit: Payer: Medicare Other | Admitting: Cardiovascular Disease

## 2021-04-14 ENCOUNTER — Emergency Department (HOSPITAL_BASED_OUTPATIENT_CLINIC_OR_DEPARTMENT_OTHER)
Admission: EM | Admit: 2021-04-14 | Discharge: 2021-04-14 | Disposition: A | Discharge status: Home / Self Care | Payer: Medicare Other | Attending: Emergency Medicine | Admitting: Emergency Medicine

## 2021-04-14 ENCOUNTER — Emergency Department (HOSPITAL_BASED_OUTPATIENT_CLINIC_OR_DEPARTMENT_OTHER): Payer: Medicare Other

## 2021-04-14 ENCOUNTER — Other Ambulatory Visit: Payer: Self-pay

## 2021-04-14 ENCOUNTER — Encounter (HOSPITAL_BASED_OUTPATIENT_CLINIC_OR_DEPARTMENT_OTHER): Payer: Self-pay | Admitting: Emergency Medicine

## 2021-04-14 DIAGNOSIS — S8991XA Unspecified injury of right lower leg, initial encounter: Secondary | ICD-10-CM | POA: Diagnosis present

## 2021-04-14 DIAGNOSIS — Z7982 Long term (current) use of aspirin: Secondary | ICD-10-CM | POA: Insufficient documentation

## 2021-04-14 DIAGNOSIS — X501XXA Overexertion from prolonged static or awkward postures, initial encounter: Secondary | ICD-10-CM | POA: Insufficient documentation

## 2021-04-14 DIAGNOSIS — I110 Hypertensive heart disease with heart failure: Secondary | ICD-10-CM | POA: Insufficient documentation

## 2021-04-14 DIAGNOSIS — Z951 Presence of aortocoronary bypass graft: Secondary | ICD-10-CM | POA: Diagnosis not present

## 2021-04-14 DIAGNOSIS — F17210 Nicotine dependence, cigarettes, uncomplicated: Secondary | ICD-10-CM | POA: Diagnosis not present

## 2021-04-14 DIAGNOSIS — M1711 Unilateral primary osteoarthritis, right knee: Secondary | ICD-10-CM

## 2021-04-14 DIAGNOSIS — I25119 Atherosclerotic heart disease of native coronary artery with unspecified angina pectoris: Secondary | ICD-10-CM | POA: Diagnosis not present

## 2021-04-14 DIAGNOSIS — E119 Type 2 diabetes mellitus without complications: Secondary | ICD-10-CM | POA: Diagnosis not present

## 2021-04-14 DIAGNOSIS — Z7984 Long term (current) use of oral hypoglycemic drugs: Secondary | ICD-10-CM | POA: Diagnosis not present

## 2021-04-14 DIAGNOSIS — Z79899 Other long term (current) drug therapy: Secondary | ICD-10-CM | POA: Diagnosis not present

## 2021-04-14 DIAGNOSIS — I5043 Acute on chronic combined systolic (congestive) and diastolic (congestive) heart failure: Secondary | ICD-10-CM | POA: Diagnosis not present

## 2021-04-14 DIAGNOSIS — Y9289 Other specified places as the place of occurrence of the external cause: Secondary | ICD-10-CM | POA: Insufficient documentation

## 2021-04-14 MED ORDER — LIDOCAINE HCL (PF) 1 % IJ SOLN
5.0000 mL | Freq: Once | INTRAMUSCULAR | Status: AC
  Administered 2021-04-14: 5 mL
  Filled 2021-04-14: qty 5

## 2021-04-14 MED ORDER — TRIAMCINOLONE ACETONIDE 40 MG/ML IJ SUSP
40.0000 mg | Freq: Once | INTRAMUSCULAR | Status: AC
  Administered 2021-04-14: 40 mg via INTRA_ARTICULAR
  Filled 2021-04-14: qty 5

## 2021-04-14 MED ORDER — ACETAMINOPHEN 325 MG PO TABS
650.0000 mg | ORAL_TABLET | Freq: Once | ORAL | Status: AC
  Administered 2021-04-14: 650 mg via ORAL
  Filled 2021-04-14: qty 2

## 2021-04-14 NOTE — ED Notes (Signed)
Patient verbalizes understanding of discharge instructions. Opportunity for questioning and answers were provided. Armband removed by staff, pt discharged from ED.  

## 2021-04-14 NOTE — ED Provider Notes (Signed)
[facility] EMERGENCY DEPT Provider Note   CSN: [id number] Arrival date & time: 04/14/21  1519     History Chief Complaint  Patient presents with  . Knee Pain    Matthew Moses is a 70 y.o. male.  The history is provided by the patient.  Knee Pain Location:  Knee Time since incident:  1 day Injury: yes   Mechanism of injury comment:  Hyperextension injury ascending stairs Knee location:  R knee Pain details:    Quality:  Shooting   Radiates to:  Does not radiate   Severity:  Moderate   Onset quality:  Sudden   Duration:  1 day   Timing:  Constant   Progression:  Worsening Chronicity:  New Dislocation: no   Prior injury to area:  No Relieved by:  Nothing Worsened by:  Bearing weight, flexion and extension Associated symptoms: decreased ROM and swelling   Associated symptoms: no back pain, no fever and no numbness        Past Medical History:  Diagnosis Date  . CAD, multiple vessel    Severe native CAD involving left main, LAD, RCA, ramus intermedius and PDA -> status post CABG x2 (LIMA-LAD, SVG-L PDA)  . Coronary artery disease involving coronary bypass graft of native heart with angina pectoris (HCC) - thrombotic 95% oclusion of SVG-LPDA 04/03/2018   occluded LAD, subtotally occluded nondominant to codominant RCA, LCx & RI.  LIMA-LAD was intact. CULPRIT LESION was the SVG-dLPLA that had a high-grade proximal thrombotic stenosis.  S/P successful PCI of SVG-LPDA stented his vein graft with a 3.5 mm drug-eluting stent using distal protection.  [name] Kitchen Headache     " from nitro"   . Hx of CABG   . Hyperlipidemia associated with type 2 diabetes mellitus (HCC)   . Hypertension   . Myocardial infarction (HCC)   . Open wound of left forearm   . Stroke (HCC)   . Tobacco abuse   . Type II diabetes mellitus with complication (HCC)    History of stroke and CAD-CABG  . Wears dentures     Patient Active Problem List   Diagnosis Date Noted  . Subdural hematoma  (HCC) 02/07/2020  . H/O fasciotomy 05/01/2019  . NSTEMI (non-ST elevated myocardial infarction) (HCC) 04/30/2019  . Acute on chronic combined systolic and diastolic CHF (congestive heart failure) (HCC) 04/30/2019  . Surgery follow-up 04/30/2019  . Coronary stent restenosis   . Unstable angina (HCC) 12/21/2018  . Other chronic pain 06/09/2018  . Tobacco dependence 06/09/2018  . Ventricular tachycardia, sustained (HCC) -n setting of STEMI 06/05/2018  . Coronary artery disease involving coronary bypass graft of native heart with angina pectoris (HCC) 06/05/2018  . Type 2 diabetes mellitus with hyperglycemia (HCC) 06/05/2018  . Anxiety 05/23/2018  . Leg edema, right 04/19/2018  . History of CVA (cerebrovascular accident) 04/11/2018  . CAD S/P percutaneous coronary angioplasty 04/11/2018  . Pleuritic chest pain 04/11/2018  . Ischemic cardiomyopathy - 04/03/2018  . Tobacco abuse   . Acute ST elevation myocardial infarction (STEMI) of posterior wall (HCC) 04/02/2018  . Diabetes mellitus type 2, controlled, with complications (HCC) 07/05/2016  . Cigarette smoker 07/05/2016  . Marijuana abuse 07/05/2016  . Obesity 07/05/2016  . Rheumatoid arthritis (HCC) 07/05/2016  . Cerebral infarction (HCC) - L posterior limb internal capsule d/t small vessel disease, s/p IV tPA 07/02/2016  . Benign essential hypertension 07/25/2015  . Hypercholesterolemia 07/25/2015  . S/P CABG (coronary artery bypass graft) 07/25/2015  . Coronary artery disease  involving native coronary artery of native heart without angina pectoris 07/25/2015  . Long-term use of aspirin therapy 07/25/2015  . Paroxysmal atrial fibrillation (HCC) 07/25/2015    Past Surgical History:  Procedure Laterality Date  . CORONARY ARTERY BYPASS GRAFT    . CORONARY STENT INTERVENTION N/A 04/30/2019   Procedure: CORONARY STENT INTERVENTION;  Surgeon: Marykay Lex, MD;  Location: [facility] INVASIVE CV LAB;  Service: Cardiovascular;  Laterality: N/A;   . CORONARY/GRAFT ACUTE MI REVASCULARIZATION N/A 04/02/2018   Procedure: Coronary/Graft Acute MI Revascularization;  Surgeon: [name], MD;  Location: [facility] INVASIVE CV LAB;  Service: Cardiovascular: Thrombotic 95% SVG-L BCW:UGQB - thrombectomy -> DES PCI Synergy DES 3.5 x 20 (3.9 mm)  . FASCIOTOMY Left 04/30/2019   Procedure: FASCIOTOMY AND  ARTERY REPAIR;  Surgeon: Mack Hook, MD;  Location: [facility] OR;  Service: Orthopedics;  Laterality: Left;  . KNEE SURGERY    . LEFT HEART CATH AND CORS/GRAFTS ANGIOGRAPHY N/A 04/02/2018   Procedure: LEFT HEART CATH AND CORS/GRAFTS ANGIOGRAPHY;  Surgeon: [name], MD;  Location: [facility] INVASIVE CV LAB;  Service: Cardiovascular: Ostial Left Main 50%; distal Left Main-pLAD 100% - d(apical)LAD 95 (after patent LIMA-LAD).OstRI - 99%; ost-mLCx 95%, dCx 95% & OM2 95%; ost-pRCA (co-dominant) 99%mRCA 80%; SVG-LPDA 99% ostial thrombosis; EF 30% with a EDP 31 mmHg  . LEFT HEART CATH AND CORS/GRAFTS ANGIOGRAPHY N/A 04/30/2019   Procedure: LEFT HEART CATH AND CORS/GRAFTS ANGIOGRAPHY;  Surgeon: Marykay Lex, MD;  Location: [facility] INVASIVE CV LAB;  Service: Cardiovascular;  Laterality: N/A;  . MULTIPLE TOOTH EXTRACTIONS    . MUSCLE REPAIR    . SECONDARY CLOSURE OF WOUND Left 05/07/2019   Procedure: DELAYED PRIMARY CLOSURE LEFT FOREARM WOUND;  Surgeon: Mack Hook, MD;  Location: [facility] OR;  Service: Orthopedics;  Laterality: Left;  . TONSILLECTOMY         Family History  Problem Relation Age of Onset  . CAD Mother        CABG 1's, died 30's    Social History   Tobacco Use  . Smoking status: Current Every Day Smoker    Packs/day: 1.00    Types: Cigarettes  . Smokeless tobacco: Never Used  Vaping Use  . Vaping Use: Never used  Substance Use Topics  . Alcohol use: No  . Drug use: Yes    Types: Marijuana    Home Medications Prior to Admission medications   Medication Sig Start Date End Date Taking? Authorizing Provider  aspirin 81 MG EC tablet Take  1 tablet (81 mg total) by mouth at bedtime. 03/26/20   [name], MD  atorvastatin (LIPITOR) 80 MG tablet Take 1 tablet (80 mg total) by mouth daily at 6 PM. 03/26/20   [name], MD  JARDIANCE 10 MG TABS tablet TAKE ONE (1) TABLET BY MOUTH EVERY DAY 12/04/20   [name], MD  Lancets [facility] ULTRASOFT) lancets Use as instructed 07/05/16   Layne Benton, NP  levETIRAcetam (KEPPRA) 500 MG tablet Take 1 tablet (500 mg total) by mouth 2 (two) times daily. 02/08/20   Costella, Darci Current, PA-C  metFORMIN (GLUCOPHAGE) 500 MG tablet TAKE 1 TABLET(500 MG) BY MOUTH TWICE DAILY WITH A MEAL 03/26/20   [name], MD  metoprolol tartrate (LOPRESSOR) 25 MG tablet Take 0.5 tablets (12.5 mg total) by mouth 2 (two) times daily. 03/26/20   [name], MD  nitroGLYCERIN (NITROSTAT) 0.4 MG SL tablet Place 1 tablet (0.4 mg total) under the  tongue every 5 (five) minutes as needed. 06/04/19   Azalee Course, PA  valsartan (DIOVAN) 40 MG tablet TAKE ONE (1) TABLET BY MOUTH EVERY DAY 10/06/20   [name], MD    Allergies    Penicillins, Codeine, and Nsaids  Review of Systems   Review of Systems  Constitutional: Negative for chills and fever.  HENT: Negative for ear pain and sore throat.   Eyes: Negative for pain and visual disturbance.  Respiratory: Negative for cough and shortness of breath.   Cardiovascular: Negative for chest pain and palpitations.  Gastrointestinal: Negative for abdominal pain and vomiting.  Genitourinary: Negative for dysuria and hematuria.  Musculoskeletal: Negative for arthralgias and back pain.  Skin: Negative for color change and rash.  Neurological: Negative for seizures and syncope.  All other systems reviewed and are negative.   Physical Exam Updated Vital Signs BP 123/73 (BP Location: Right Arm)   Pulse 73   Temp 98.3 F (36.8 C) (Oral)   Resp 18   Ht  (1.854 m)   Wt 106.6 kg   SpO2 99%   BMI 31.00 kg/m   Physical Exam Vitals and  nursing note reviewed.  Constitutional:      Appearance: Normal appearance.  HENT:     Head: Normocephalic and atraumatic.  Eyes:     Conjunctiva/sclera: Conjunctivae normal.  Pulmonary:     Effort: Pulmonary effort is normal. No respiratory distress.  Musculoskeletal:        General: No deformity. Normal range of motion.     Cervical back: Normal range of motion.     Comments: The right knee is held at 45 degrees of flexion, and further extension or flexion and is extremely painful especially at the lateral aspect of the knee.  There is a 1-2+ effusion.  There is tenderness to palpation at the lateral joint line.  No tenderness to palpation at the patellar borders or medial joint line.  There are some mild swelling at the right ankle and foot.  Provocative testing was limited secondary to his pain and guarding.  This limb is neurovascularly normal.  Skin:    General: Skin is warm and dry.  Neurological:     General: No focal deficit present.     Mental Status: He is alert and oriented to person, place, and time. Mental status is at baseline.  Psychiatric:        Mood and Affect: Mood normal.     ED Results / Procedures / Treatments   Labs (all labs ordered are listed, but only abnormal results are displayed) Labs Reviewed - No data to display  EKG None  Radiology No results found.  Procedures .Joint Aspiration/Arthrocentesis  Date/Time: 04/14/2021 4:52 PM Performed by: Koleen Distance, MD Authorized by: Koleen Distance, MD   Consent:    Consent obtained:  Verbal   Consent given by:  Patient   Risks, benefits, and alternatives were discussed: yes     Risks discussed:  Bleeding, incomplete drainage, infection and pain   Alternatives discussed:  No treatment, delayed treatment, alternative treatment, observation and referral Universal protocol:    Procedure explained and questions answered to patient or proxy's satisfaction: yes     Imaging studies available: yes      Immediately prior to procedure, a time out was called: yes     Patient identity confirmed:  Verbally with patient Location:    Location:  Knee   Knee:  R knee Anesthesia:  Anesthesia method:  Local infiltration   Local anesthetic:  Lidocaine 1% w/o epi Procedure details:    Needle gauge:  18 G   Ultrasound guidance: no     Approach:  Superior (superolateral)   Aspirate characteristics:  Yellow   Steroid injected: yes (80mg  kenalog)   Post-procedure details:    Dressing:  Adhesive bandage   Procedure completion:  Tolerated well, no immediate complications     Medications Ordered in ED Medications  acetaminophen (TYLENOL) tablet 650 mg (has no administration in time range)    ED Course  I have reviewed the triage vital signs and the nursing notes.  Pertinent labs & imaging results that were available during my care of the patient were reviewed by me and considered in my medical decision making (see chart for details).    MDM Rules/Calculators/A&P                          presented after a traumatic knee injury.  He does have some mild osteoarthritis, and I am also suspicious of meniscal pathology or less likely, lateral collateral ligament sprain.  I offered the patient a steroid injection, and he agreed to this option.  He was given instructions on further symptomatic management and instructions for orthopedic follow-up should his symptoms persist. Final Clinical Impression(s) / ED Diagnoses Final diagnoses:  Injury of right knee, initial encounter  Primary osteoarthritis of right knee    Rx / DC Orders ED Discharge Orders    None       Lennie Hummer, MD 04/14/21 1656

## 2021-04-14 NOTE — ED Triage Notes (Signed)
Pt arrives to ED with c/o of right knee pain and swelling after his knee folded backwards after misjudging a step.

## 2021-04-14 NOTE — ED Notes (Addendum)
Present with swelling to right knee after mis stepping and having knee hyperextend yesterday.  States unable to weight bear.  Positive pedal pulses.

## 2021-04-20 ENCOUNTER — Other Ambulatory Visit: Payer: Self-pay | Admitting: Cardiovascular Disease

## 2021-04-28 ENCOUNTER — Other Ambulatory Visit (HOSPITAL_COMMUNITY): Payer: Self-pay | Admitting: Orthopedic Surgery

## 2021-04-28 DIAGNOSIS — M7989 Other specified soft tissue disorders: Secondary | ICD-10-CM

## 2021-04-28 DIAGNOSIS — M79661 Pain in right lower leg: Secondary | ICD-10-CM

## 2021-04-29 ENCOUNTER — Encounter (HOSPITAL_COMMUNITY): Payer: Medicare Other

## 2021-05-01 ENCOUNTER — Ambulatory Visit (HOSPITAL_COMMUNITY): Payer: Medicare Other

## 2021-05-04 ENCOUNTER — Ambulatory Visit (HOSPITAL_COMMUNITY): Payer: Medicare Other

## 2021-05-12 ENCOUNTER — Ambulatory Visit (HOSPITAL_COMMUNITY)
Admission: RE | Admit: 2021-05-12 | Discharge: 2021-05-12 | Disposition: A | Discharge status: Home / Self Care | Payer: Medicare Other | Source: Ambulatory Visit | Attending: Orthopedic Surgery | Admitting: Orthopedic Surgery

## 2021-05-12 ENCOUNTER — Other Ambulatory Visit: Payer: Self-pay

## 2021-05-12 ENCOUNTER — Other Ambulatory Visit (HOSPITAL_COMMUNITY): Payer: Self-pay | Admitting: Orthopedic Surgery

## 2021-05-12 DIAGNOSIS — M7989 Other specified soft tissue disorders: Secondary | ICD-10-CM

## 2021-05-12 DIAGNOSIS — M79661 Pain in right lower leg: Secondary | ICD-10-CM

## 2021-05-19 ENCOUNTER — Telehealth: Payer: Self-pay | Admitting: Cardiovascular Disease

## 2021-05-19 NOTE — Telephone Encounter (Signed)
   [facility] Pre-operative Risk Assessment    Request for surgical clearance:  What type of surgery is being performed? Right Knee scope subchondroplasty  When is this surgery scheduled? 05/22/21  What type of clearance is required (medical clearance vs. Pharmacy clearance to hold med vs. Both)? Both  Are there any medications that need to be held prior to surgery and how long? Not that she is aware of yet  Practice name and name of physician performing surgery? Dr. Dorna Leitz [facility]  What is your office phone number [phone]   7.   What is your office fax number [phone]  8.   Anesthesia type (None, local, MAC, general) ? Choice   Larina Bras 05/19/2021, 9:01 AM  _________________________________________________________________   (provider comments below)

## 2021-05-19 NOTE — Telephone Encounter (Signed)
Unable to leave VM.   Pt has not been seen in the last 6 months and has a history of significant CAD with remote CABG, last PCI in 2020.  I will ask for help getting him an appt in the event he is unable to complete 4.0 METS.

## 2021-05-19 NOTE — Telephone Encounter (Signed)
S/w the pt in regards to he will need an appt for pre op clearance. Pt see's Dr. Allyson Sabal. I have added pt to see Dr. Allyson Sabal tomorrow, ok per MD. Appt 6/29 @ 9:45. I will forward the clearance notes to Md for appt tomorrow. Will send FYI to surgeon's office pt has appt tomorrow 05/20/21.

## 2021-05-20 ENCOUNTER — Other Ambulatory Visit: Payer: Self-pay

## 2021-05-20 ENCOUNTER — Encounter: Payer: Self-pay | Admitting: Cardiovascular Disease

## 2021-05-20 ENCOUNTER — Ambulatory Visit (INDEPENDENT_AMBULATORY_CARE_PROVIDER_SITE_OTHER): Payer: Medicare Other | Admitting: Cardiovascular Disease

## 2021-05-20 VITALS — BP 93/53 | HR 60 | Ht 73.0 in | Wt 235.0 lb

## 2021-05-20 DIAGNOSIS — E7800 Pure hypercholesterolemia, unspecified: Secondary | ICD-10-CM

## 2021-05-20 DIAGNOSIS — I10 Essential (primary) hypertension: Secondary | ICD-10-CM

## 2021-05-20 DIAGNOSIS — Z720 Tobacco use: Secondary | ICD-10-CM

## 2021-05-20 DIAGNOSIS — I25709 Atherosclerosis of coronary artery bypass graft(s), unspecified, with unspecified angina pectoris: Secondary | ICD-10-CM

## 2021-05-20 DIAGNOSIS — Z01818 Encounter for other preprocedural examination: Secondary | ICD-10-CM

## 2021-05-20 DIAGNOSIS — I255 Ischemic cardiomyopathy: Secondary | ICD-10-CM

## 2021-05-20 LAB — LIPID PANEL
Chol/HDL Ratio: 5.5 ratio — ABNORMAL HIGH (ref 0.0–5.0)
Cholesterol, Total: 166 mg/dL (ref 100–199)
HDL: 30 mg/dL — ABNORMAL LOW (ref >39)
LDL Chol Calc (NIH): 100 mg/dL — ABNORMAL HIGH (ref 0–99)
Triglycerides: 205 mg/dL — ABNORMAL HIGH (ref 0–149)
VLDL Cholesterol Cal: 36 mg/dL (ref 5–40)

## 2021-05-20 LAB — HEPATIC FUNCTION PANEL
ALT: 13 IU/L (ref 0–44)
AST: 12 IU/L (ref 0–40)
Albumin: 3.9 g/dL (ref 3.8–4.8)
Alkaline Phosphatase: 95 IU/L (ref 44–121)
Bilirubin Total: 0.6 mg/dL (ref 0.0–1.2)
Bilirubin, Direct: 0.14 mg/dL (ref 0.00–0.40)
Total Protein: 6.8 g/dL (ref 6.0–8.5)

## 2021-05-20 NOTE — Assessment & Plan Note (Signed)
Ongoing tobacco abuse of 1/2 pack/day recalcitrant to risk factor modification.  We will investigate whether or not we can get him assistance for a NicoDerm patch.

## 2021-05-20 NOTE — Assessment & Plan Note (Signed)
History of CAD status post CABG at [facility] in 2012.  He had a VT/VF arrest 04/02/2018 and I stented his obtuse marginal branch vein graft at that time postdilated to 3.9 mm.  His LIMA was intact.  He subsequently had a cardiac catheterization performed Dr. Herbie Baltimore in the setting of non-STEMI 04/30/2019 demonstrating severe "in-stent restenosis and underwent Cutting Balloon atherectomy with excellent result.  He no longer is on dual antiplatelet therapy but is asymptomatic.

## 2021-05-20 NOTE — Assessment & Plan Note (Signed)
History of ischemic cardiomyopathy with echo performed 06/26/2019 revealing normal LV systolic function.  He denies chest pain or shortness of breath.
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
## 2021-05-20 NOTE — Assessment & Plan Note (Signed)
History of essential hypertension a blood pressure measured today at 93/53.  He is on metoprolol and valsartan.

## 2021-05-20 NOTE — Assessment & Plan Note (Signed)
Cardiogram history of hyperlipidemia on atorvastatin high-dose with lipid profile performed 03/26/2020 revealing total cholesterol 170, LDL 108 and HDL of 31.  We will recheck a lipid liver profile.

## 2021-05-20 NOTE — Patient Instructions (Signed)
Medication Instructions:  Your physician recommends that you continue on your current medications as directed. Please refer to the Current Medication list given to you today.  *If you need a refill on your cardiac medications before your next appointment, please call your pharmacy*  Lab Work: Your physician recommends that you return for lab work TODAY:  Fasting Lipid Test Hepatic (Liver) Function Test   If you have labs (blood work) drawn today and your tests are completely normal, you will receive your results only by: MyChart Message (if you have MyChart) OR A paper copy in the mail If you have any lab test that is abnormal or we need to change your treatment, we will call you to review the results.  Testing/Procedures: NONE ordered at this time of appointment   Follow-Up: At [facility], you and your health needs are our priority.  As part of our continuing mission to provide you with exceptional heart care, we have created designated Provider Care Teams.  These Care Teams include your primary Cardiologist (physician) and Advanced Practice Providers (APPs -  Physician Assistants and Nurse Practitioners) who all work together to provide you with the care you need, when you need it.  We recommend signing up for the patient portal called "MyChart".  Sign up information is provided on this After Visit Summary.  MyChart is used to connect with patients for Virtual Visits (Telemedicine).  Patients are able to view lab/test results, encounter notes, upcoming appointments, etc.  Non-urgent messages can be sent to your provider as well.   To learn more about what you can do with MyChart, go to [email or web address].    Your next appointment:   1 year(s)  The format for your next appointment:   In Person  Provider:   Nanetta Batty, MD  Other Instructions

## 2021-05-21 ENCOUNTER — Other Ambulatory Visit: Payer: Self-pay | Admitting: Orthopedic Surgery

## 2021-05-22 NOTE — Telephone Encounter (Signed)
   Primary Cardiologist: Nanetta Batty, MD  Chart reviewed as part of pre-operative protocol coverage. Given past medical history and time since last visit, based on ACC/AHA guidelines, Matthew Moses would be at acceptable risk for the planned procedure without further cardiovascular testing.   I will route this recommendation to the requesting party via Epic fax function and remove from pre-op pool.  Please call with questions.  Thomasene Ripple. Keiona Jenison NP-C    05/22/2021, 2:44 PM [facility] [address] Office [phone] Fax [phone]

## 2021-05-26 ENCOUNTER — Encounter (HOSPITAL_COMMUNITY): Payer: Self-pay | Admitting: Anesthesiology

## 2021-05-26 ENCOUNTER — Other Ambulatory Visit: Payer: Self-pay

## 2021-05-26 ENCOUNTER — Encounter (HOSPITAL_BASED_OUTPATIENT_CLINIC_OR_DEPARTMENT_OTHER): Payer: Self-pay | Admitting: Orthopedic Surgery

## 2021-05-26 NOTE — Progress Notes (Signed)
Chart reviewed with Dr Bass, OK for DSC. 

## 2021-05-28 ENCOUNTER — Other Ambulatory Visit: Payer: Self-pay

## 2021-05-28 DIAGNOSIS — E7800 Pure hypercholesterolemia, unspecified: Secondary | ICD-10-CM

## 2021-05-28 MED ORDER — EZETIMIBE 10 MG PO TABS: 10.0000 mg | ORAL_TABLET | Freq: Every day | ORAL | 3 refills | Status: DC

## 2021-05-29 ENCOUNTER — Ambulatory Visit (HOSPITAL_BASED_OUTPATIENT_CLINIC_OR_DEPARTMENT_OTHER): Admission: RE | Admit: 2021-05-29 | Payer: Medicare Other | Source: Home / Self Care | Admitting: Orthopedic Surgery

## 2021-05-29 SURGERY — ARTHROSCOPY, KNEE, WITH SUBCHONDROPLASTY
Anesthesia: Choice | Site: Knee | Laterality: Right

## 2021-06-02 ENCOUNTER — Other Ambulatory Visit: Payer: Self-pay | Admitting: Cardiovascular Disease

## 2021-06-30 ENCOUNTER — Other Ambulatory Visit: Payer: Self-pay

## 2021-06-30 ENCOUNTER — Other Ambulatory Visit: Payer: Self-pay | Admitting: Cardiovascular Disease

## 2021-06-30 ENCOUNTER — Telehealth: Payer: Self-pay | Admitting: Cardiovascular Disease

## 2021-06-30 MED ORDER — NITROGLYCERIN 0.4 MG SL SUBL: 0.4000 mg | SUBLINGUAL_TABLET | SUBLINGUAL | 2 refills | Status: DC | PRN

## 2021-06-30 NOTE — Telephone Encounter (Signed)
*  STAT* If patient is at the pharmacy, call can be transferred to refill team.   1. Which medications need to be refilled? (please list name of each medication and dose if known)  nitroGLYCERIN (NITROSTAT) 0.4 MG SL tablet  2. Which pharmacy/location (including street and city if local pharmacy) is medication to be sent to?  [facility] - [place], [place] - [address]  3. Do they need a 30 day or 90 day supply?  90

## 2021-07-02 ENCOUNTER — Other Ambulatory Visit: Payer: Self-pay

## 2021-07-02 MED ORDER — NITROGLYCERIN 0.4 MG SL SUBL
0.4000 mg | SUBLINGUAL_TABLET | SUBLINGUAL | 11 refills | Status: DC | PRN
Start: 2021-07-02 — End: 2022-08-04

## 2021-07-03 ENCOUNTER — Telehealth: Payer: Self-pay | Admitting: Cardiovascular Disease

## 2021-07-03 NOTE — Telephone Encounter (Signed)
Pt called requesting if Dr. Allyson Sabal is ok for him to use a transcutaneous electrical nerve stimulation (TENS) unit. He state he was instructed to get approval from his cardiologist prior to using.   Will forward to MD

## 2021-07-03 NOTE — Telephone Encounter (Signed)
Matthew Moses is calling wanting to ask Dr. Allyson Sabal if he believes it to be okay for him to start using a TENS unit. Please advise.

## 2021-07-06 NOTE — Telephone Encounter (Signed)
Attempted to contact pt. Unable to leave message as mailbox not set up.  

## 2021-07-08 NOTE — Telephone Encounter (Signed)
Attempted to contact pt x 2 to rely message listed below. Left message to call back.  [name], MD  You 2 days ago   That's fine with me

## 2021-07-10 NOTE — Telephone Encounter (Signed)
Attempted to contact pt X 3. Unable to leave message

## 2021-08-27 ENCOUNTER — Other Ambulatory Visit: Payer: Self-pay | Admitting: Orthopedic Surgery

## 2021-08-27 DIAGNOSIS — M25561 Pain in right knee: Secondary | ICD-10-CM

## 2021-09-03 ENCOUNTER — Other Ambulatory Visit: Payer: Medicare Other

## 2021-09-07 ENCOUNTER — Ambulatory Visit
Admission: RE | Admit: 2021-09-07 | Discharge: 2021-09-07 | Disposition: A | Discharge status: Home / Self Care | Payer: Medicare Other | Source: Ambulatory Visit | Attending: Orthopedic Surgery | Admitting: Orthopedic Surgery

## 2021-09-07 DIAGNOSIS — M25561 Pain in right knee: Secondary | ICD-10-CM

## 2021-10-13 ENCOUNTER — Emergency Department (HOSPITAL_COMMUNITY): Payer: Medicare Other

## 2021-10-13 ENCOUNTER — Encounter (HOSPITAL_COMMUNITY): Payer: Self-pay | Admitting: Emergency Medicine

## 2021-10-13 ENCOUNTER — Other Ambulatory Visit: Payer: Self-pay

## 2021-10-13 ENCOUNTER — Inpatient Hospital Stay (HOSPITAL_COMMUNITY)
Admission: EM | Admit: 2021-10-13 | Discharge: 2021-10-13 | DRG: 065 | Discharge status: Against Medical Advice | Payer: Medicare Other | Attending: Internal Medicine | Admitting: Internal Medicine

## 2021-10-13 DIAGNOSIS — E1169 Type 2 diabetes mellitus with other specified complication: Secondary | ICD-10-CM | POA: Diagnosis present

## 2021-10-13 DIAGNOSIS — Z885 Allergy status to narcotic agent status: Secondary | ICD-10-CM

## 2021-10-13 DIAGNOSIS — I252 Old myocardial infarction: Secondary | ICD-10-CM | POA: Diagnosis not present

## 2021-10-13 DIAGNOSIS — Z20822 Contact with and (suspected) exposure to covid-19: Secondary | ICD-10-CM | POA: Diagnosis present

## 2021-10-13 DIAGNOSIS — F17210 Nicotine dependence, cigarettes, uncomplicated: Secondary | ICD-10-CM | POA: Diagnosis present

## 2021-10-13 DIAGNOSIS — Z8249 Family history of ischemic heart disease and other diseases of the circulatory system: Secondary | ICD-10-CM | POA: Diagnosis not present

## 2021-10-13 DIAGNOSIS — R079 Chest pain, unspecified: Secondary | ICD-10-CM | POA: Diagnosis present

## 2021-10-13 DIAGNOSIS — I2510 Atherosclerotic heart disease of native coronary artery without angina pectoris: Secondary | ICD-10-CM | POA: Diagnosis present

## 2021-10-13 DIAGNOSIS — R4781 Slurred speech: Secondary | ICD-10-CM | POA: Diagnosis present

## 2021-10-13 DIAGNOSIS — R29700 NIHSS score 0: Secondary | ICD-10-CM | POA: Diagnosis present

## 2021-10-13 DIAGNOSIS — I5032 Chronic diastolic (congestive) heart failure: Secondary | ICD-10-CM | POA: Diagnosis present

## 2021-10-13 DIAGNOSIS — I480 Paroxysmal atrial fibrillation: Secondary | ICD-10-CM | POA: Diagnosis present

## 2021-10-13 DIAGNOSIS — Z8673 Personal history of transient ischemic attack (TIA), and cerebral infarction without residual deficits: Secondary | ICD-10-CM | POA: Diagnosis not present

## 2021-10-13 DIAGNOSIS — I255 Ischemic cardiomyopathy: Secondary | ICD-10-CM | POA: Diagnosis present

## 2021-10-13 DIAGNOSIS — I6300 Cerebral infarction due to thrombosis of unspecified precerebral artery: Secondary | ICD-10-CM

## 2021-10-13 DIAGNOSIS — R42 Dizziness and giddiness: Secondary | ICD-10-CM | POA: Diagnosis not present

## 2021-10-13 DIAGNOSIS — E1165 Type 2 diabetes mellitus with hyperglycemia: Secondary | ICD-10-CM | POA: Diagnosis present

## 2021-10-13 DIAGNOSIS — Z9114 Patient's other noncompliance with medication regimen: Secondary | ICD-10-CM

## 2021-10-13 DIAGNOSIS — Z951 Presence of aortocoronary bypass graft: Secondary | ICD-10-CM | POA: Diagnosis not present

## 2021-10-13 DIAGNOSIS — Z880 Allergy status to penicillin: Secondary | ICD-10-CM

## 2021-10-13 DIAGNOSIS — Z955 Presence of coronary angioplasty implant and graft: Secondary | ICD-10-CM | POA: Diagnosis not present

## 2021-10-13 DIAGNOSIS — I639 Cerebral infarction, unspecified: Secondary | ICD-10-CM | POA: Diagnosis present

## 2021-10-13 DIAGNOSIS — Z91199 Patient's noncompliance with other medical treatment and regimen due to unspecified reason: Secondary | ICD-10-CM

## 2021-10-13 DIAGNOSIS — I110 Hypertensive heart disease with heart failure: Secondary | ICD-10-CM | POA: Diagnosis present

## 2021-10-13 DIAGNOSIS — Z7982 Long term (current) use of aspirin: Secondary | ICD-10-CM

## 2021-10-13 DIAGNOSIS — Z7984 Long term (current) use of oral hypoglycemic drugs: Secondary | ICD-10-CM | POA: Diagnosis not present

## 2021-10-13 DIAGNOSIS — M069 Rheumatoid arthritis, unspecified: Secondary | ICD-10-CM | POA: Diagnosis present

## 2021-10-13 DIAGNOSIS — E785 Hyperlipidemia, unspecified: Secondary | ICD-10-CM | POA: Diagnosis present

## 2021-10-13 DIAGNOSIS — R270 Ataxia, unspecified: Secondary | ICD-10-CM | POA: Diagnosis present

## 2021-10-13 DIAGNOSIS — F419 Anxiety disorder, unspecified: Secondary | ICD-10-CM | POA: Diagnosis present

## 2021-10-13 DIAGNOSIS — Z716 Tobacco abuse counseling: Secondary | ICD-10-CM

## 2021-10-13 DIAGNOSIS — Z79899 Other long term (current) drug therapy: Secondary | ICD-10-CM

## 2021-10-13 DIAGNOSIS — I63539 Cerebral infarction due to unspecified occlusion or stenosis of unspecified posterior cerebral artery: Secondary | ICD-10-CM

## 2021-10-13 DIAGNOSIS — Z886 Allergy status to analgesic agent status: Secondary | ICD-10-CM

## 2021-10-13 LAB — COMPREHENSIVE METABOLIC PANEL
ALT: 15 U/L (ref 0–44)
AST: 17 U/L (ref 15–41)
Albumin: 3.7 g/dL (ref 3.5–5.0)
Alkaline Phosphatase: 89 U/L (ref 38–126)
Anion gap: 16 — ABNORMAL HIGH (ref 5–15)
BUN: 10 mg/dL (ref 8–23)
CO2: 22 mmol/L (ref 22–32)
Calcium: 9.3 mg/dL (ref 8.9–10.3)
Chloride: 97 mmol/L — ABNORMAL LOW (ref 98–111)
Creatinine, Ser: 0.7 mg/dL (ref 0.61–1.24)
GFR, Estimated: >60 mL/min (ref >60)
Glucose, Bld: 282 mg/dL — ABNORMAL HIGH (ref 70–99)
Potassium: 3.3 mmol/L — ABNORMAL LOW (ref 3.5–5.1)
Sodium: 135 mmol/L (ref 135–145)
Total Bilirubin: 1.4 mg/dL — ABNORMAL HIGH (ref 0.3–1.2)
Total Protein: 7.3 g/dL (ref 6.5–8.1)

## 2021-10-13 LAB — PROTIME-INR
INR: 1 (ref 0.8–1.2)
Prothrombin Time: 13 seconds (ref 11.4–15.2)

## 2021-10-13 LAB — DIFFERENTIAL
Abs Immature Granulocytes: 0.05 10*3/uL (ref 0.00–0.07)
Basophils Absolute: 0.1 10*3/uL (ref 0.0–0.1)
Basophils Relative: 1 %
Eosinophils Absolute: 0 10*3/uL (ref 0.0–0.5)
Eosinophils Relative: 0 %
Immature Granulocytes: 1 %
Lymphocytes Relative: 20 %
Lymphs Abs: 1.5 10*3/uL (ref 0.7–4.0)
Monocytes Absolute: 0.7 10*3/uL (ref 0.1–1.0)
Monocytes Relative: 9 %
Neutro Abs: 5.3 10*3/uL (ref 1.7–7.7)
Neutrophils Relative %: 69 %

## 2021-10-13 LAB — ETHANOL: Alcohol, Ethyl (B): <10 mg/dL (ref <10)

## 2021-10-13 LAB — CBC
HCT: 48.8 % (ref 39.0–52.0)
Hemoglobin: 17.3 g/dL — ABNORMAL HIGH (ref 13.0–17.0)
MCH: 30 pg (ref 26.0–34.0)
MCHC: 35.5 g/dL (ref 30.0–36.0)
MCV: 84.7 fL (ref 80.0–100.0)
Platelets: 218 10*3/uL (ref 150–400)
RBC: 5.76 MIL/uL (ref 4.22–5.81)
RDW: 12.8 % (ref 11.5–15.5)
WBC: 7.6 10*3/uL (ref 4.0–10.5)
nRBC: 0 % (ref 0.0–0.2)

## 2021-10-13 LAB — LIPID PANEL
Cholesterol: 185 mg/dL (ref 0–200)
HDL: 35 mg/dL — ABNORMAL LOW (ref >40)
LDL Cholesterol: 127 mg/dL — ABNORMAL HIGH (ref 0–99)
Total CHOL/HDL Ratio: 5.3 RATIO
Triglycerides: 115 mg/dL (ref <150)
VLDL: 23 mg/dL (ref 0–40)

## 2021-10-13 LAB — RAPID URINE DRUG SCREEN, HOSP PERFORMED
Amphetamines: NOT DETECTED
Barbiturates: NOT DETECTED
Benzodiazepines: NOT DETECTED
Cocaine: NOT DETECTED
Opiates: NOT DETECTED
Tetrahydrocannabinol: POSITIVE — AB

## 2021-10-13 LAB — URINALYSIS, ROUTINE W REFLEX MICROSCOPIC
Bacteria, UA: NONE SEEN
Bilirubin Urine: NEGATIVE
Glucose, UA: >=500 mg/dL — AB
Hgb urine dipstick: NEGATIVE
Ketones, ur: 20 mg/dL — AB
Leukocytes,Ua: NEGATIVE
Nitrite: NEGATIVE
Protein, ur: NEGATIVE mg/dL
Specific Gravity, Urine: 1.025 (ref 1.005–1.030)
pH: 6 (ref 5.0–8.0)

## 2021-10-13 LAB — RESP PANEL BY RT-PCR (FLU A&B, COVID) ARPGX2
Influenza A by PCR: NEGATIVE
Influenza B by PCR: NEGATIVE
SARS Coronavirus 2 by RT PCR: NEGATIVE

## 2021-10-13 LAB — TROPONIN I (HIGH SENSITIVITY)
Troponin I (High Sensitivity): 5 ng/L (ref <18)
Troponin I (High Sensitivity): 8 ng/L (ref <18)

## 2021-10-13 LAB — CBG MONITORING, ED: Glucose-Capillary: 275 mg/dL — ABNORMAL HIGH (ref 70–99)

## 2021-10-13 LAB — I-STAT BETA HCG BLOOD, ED (MC, WL, AP ONLY): I-stat hCG, quantitative: <5 m[IU]/mL (ref <5)

## 2021-10-13 LAB — HEMOGLOBIN A1C
Hgb A1c MFr Bld: 8.3 % — ABNORMAL HIGH (ref 4.8–5.6)
Mean Plasma Glucose: 191.51 mg/dL

## 2021-10-13 LAB — APTT: aPTT: 28 seconds (ref 24–36)

## 2021-10-13 MED ORDER — ONDANSETRON HCL 4 MG/2ML IJ SOLN
4.0000 mg | Freq: Once | INTRAMUSCULAR | Status: AC
  Administered 2021-10-13: 4 mg via INTRAVENOUS
  Filled 2021-10-13: qty 2

## 2021-10-13 MED ORDER — IOHEXOL 350 MG/ML SOLN
60.0000 mL | Freq: Once | INTRAVENOUS | Status: AC | PRN
  Administered 2021-10-13: 60 mL via INTRAVENOUS

## 2021-10-13 MED ORDER — ACETAMINOPHEN 650 MG RE SUPP: 650.0000 mg | Freq: Four times a day (QID) | RECTAL | Status: DC | PRN

## 2021-10-13 MED ORDER — ATORVASTATIN CALCIUM 40 MG PO TABS: 80.0000 mg | ORAL_TABLET | Freq: Every day | ORAL | Status: DC

## 2021-10-13 MED ORDER — ASPIRIN EC 81 MG PO TBEC: 81.0000 mg | DELAYED_RELEASE_TABLET | Freq: Every day | ORAL | Status: DC

## 2021-10-13 MED ORDER — SODIUM CHLORIDE 0.9% FLUSH
3.0000 mL | Freq: Two times a day (BID) | INTRAVENOUS | Status: DC
  Administered 2021-10-13: 3 mL via INTRAVENOUS

## 2021-10-13 MED ORDER — ACETAMINOPHEN 325 MG PO TABS: 650.0000 mg | ORAL_TABLET | Freq: Four times a day (QID) | ORAL | Status: DC | PRN

## 2021-10-13 MED ORDER — ENOXAPARIN SODIUM 40 MG/0.4ML IJ SOSY
40.0000 mg | PREFILLED_SYRINGE | INTRAMUSCULAR | Status: DC
  Administered 2021-10-13: 40 mg via SUBCUTANEOUS
  Filled 2021-10-13: qty 0.4

## 2021-10-13 MED ORDER — ONDANSETRON HCL 4 MG PO TABS: 4.0000 mg | ORAL_TABLET | Freq: Four times a day (QID) | ORAL | Status: DC | PRN

## 2021-10-13 MED ORDER — ONDANSETRON HCL 4 MG/2ML IJ SOLN: 4.0000 mg | Freq: Four times a day (QID) | INTRAMUSCULAR | Status: DC | PRN

## 2021-10-13 MED ORDER — MECLIZINE HCL 25 MG PO TABS
25.0000 mg | ORAL_TABLET | Freq: Once | ORAL | Status: AC
  Administered 2021-10-13: 25 mg via ORAL
  Filled 2021-10-13: qty 1

## 2021-10-13 MED ORDER — LORAZEPAM 2 MG/ML IJ SOLN
1.0000 mg | Freq: Once | INTRAMUSCULAR | Status: AC
  Administered 2021-10-13: 1 mg via INTRAVENOUS
  Filled 2021-10-13: qty 1

## 2021-10-13 NOTE — Progress Notes (Signed)
Paged to the patient's room regarding his desire to leave AGAINST MEDICAL ADVICE.  Patient states that he is feeling better and does not want to stay in the hospital any longer.  I counseled him extensively regarding his underlying medical conditions including his recently diagnosed posterior stroke.  I educated him regarding the risks of worsening of his underlying stroke symptoms and possible subsequent infarcts that could cause significant debilitation and possible death.  He admitted to understanding and states that if he has progression of his symptoms or begins to feel worse that he will return to the hospital.  I counseled him regarding the importance of following up with a primary care physician in the very near future to continue medication management for secondary prevention of ASCVD/ischemic infarct.  I informed him that he will need further work-up for stroke in the outpatient setting including echocardiogram and possible Holter monitor.  I informed him to follow-up with his cardiovascular physician to pursue this in the near future.  I reviewed the risks and benefits of leaving the hospital prior to finishing current treatment for stroke.  He again stated that he understands the risks and benefits.  I informed him that he would not be able to drive home due to his recent stroke.  He states that he will get an [place] home instead  Chari Manning, D.O.  Internal Medicine Resident, PGY-3 [facility]  Pager: [phone] 1:32 PM, 10/13/2021   **Please contact the on call pager after 5 pm and on weekends at [phone].**

## 2021-10-13 NOTE — ED Provider Notes (Signed)
[facility] EMERGENCY DEPARTMENT Provider Note   CSN: [id number] Arrival date & time: 10/13/21  0151     History Chief Complaint  Patient presents with   Dizziness   Chest Pain    Matthew Moses is a 70 y.o. male.  Level 5 caveat for patient cooperation and acuity of condition.  Patient with history of CAD status post CABG, hypertension, diabetes, previous stroke here with nausea, vomiting and dizziness onset "12 hours ago".  States he is room spinning dizziness and been vomiting 10-15 times and unable to keep anything down.  Symptoms are similar to his previous stroke.  He denies any focal weakness, numbness or tingling.  He denies any chest pain currently but states he gets it occasionally that is relieved with nitroglycerin. He is uncooperative with majority of questioning and physical exam States he is "sick and dizzy" and needs to lie down.  The history is provided by the patient.  Dizziness Associated symptoms: chest pain   Chest Pain Associated symptoms: dizziness       Past Medical History:  Diagnosis Date   CAD, multiple vessel    Severe native CAD involving left main, LAD, RCA, ramus intermedius and PDA -> status post CABG x2 (LIMA-LAD, SVG-L PDA)   Coronary artery disease involving coronary bypass graft of native heart with angina pectoris ([place]) - thrombotic 95% oclusion of SVG-LPDA 04/03/2018   occluded LAD, subtotally occluded nondominant to codominant RCA, LCx & RI.  LIMA-LAD was intact. CULPRIT LESION was the SVG-dLPLA that had a high-grade proximal thrombotic stenosis.  S/P successful PCI of SVG-LPDA stented his vein graft with a 3.5 mm drug-eluting stent using distal protection.   Headache     " from nitro"    Hx of CABG    Hyperlipidemia associated with type 2 diabetes mellitus ([place])    Hypertension    Myocardial infarction ([place])    Open wound of left forearm    Stroke ([place])    Tobacco abuse    Type II diabetes mellitus with complication  ([place])    History of stroke and CAD-CABG   Wears dentures     Patient Active Problem List   Diagnosis Date Noted   Subdural hematoma 02/07/2020   H/O fasciotomy 05/01/2019   NSTEMI (non-ST elevated myocardial infarction) ([place]) 04/30/2019   Acute on chronic combined systolic and diastolic CHF (congestive heart failure) ([place]) 04/30/2019   Surgery follow-up 04/30/2019   Coronary stent restenosis    Unstable angina ([place]) 12/21/2018   Other chronic pain 06/09/2018   Tobacco dependence 06/09/2018   Ventricular tachycardia, sustained ([place] [facility]) -n setting of STEMI 06/05/2018   Coronary artery disease involving coronary bypass graft of native heart with angina pectoris ([place]) 06/05/2018   Type 2 diabetes mellitus with hyperglycemia ([place]) 06/05/2018   Anxiety 05/23/2018   Leg edema, right 04/19/2018   History of CVA (cerebrovascular accident) 04/11/2018   CAD S/P percutaneous coronary angioplasty 04/11/2018   Pleuritic chest pain 04/11/2018   Ischemic cardiomyopathy - 04/03/2018   Tobacco abuse    Acute ST elevation myocardial infarction (STEMI) of posterior wall ([place]) 04/02/2018   Diabetes mellitus type 2, controlled, with complications ([place]) [id number]   Cigarette smoker 07/05/2016   Marijuana abuse 07/05/2016   Obesity 07/05/2016   Rheumatoid arthritis ([place]) 07/05/2016   Cerebral infarction ([place]) - L posterior limb internal capsule d/t small vessel disease, s/p IV tPA 07/02/2016   Benign essential hypertension 07/25/2015   Hypercholesterolemia 07/25/2015   S/P CABG (coronary artery  bypass graft) 07/25/2015   Coronary artery disease involving native coronary artery of native heart without angina pectoris 07/25/2015   Long-term use of aspirin therapy 07/25/2015   Paroxysmal atrial fibrillation ([place]) 07/25/2015    Past Surgical History:  Procedure Laterality Date   CORONARY ARTERY BYPASS GRAFT     CORONARY STENT INTERVENTION N/A 04/30/2019   Procedure: CORONARY STENT INTERVENTION;   Surgeon: Leonie Man, MD;  Location: [place] [facility];  Service: Cardiovascular;  Laterality: N/A;   CORONARY/GRAFT ACUTE MI REVASCULARIZATION N/A 04/02/2018   Procedure: Coronary/Graft Acute MI Revascularization;  Surgeon: Lorretta Harp, MD;  Location: [place] [facility];  Service: Cardiovascular: Thrombotic 95% SVG-L [id number] - thrombectomy -> DES PCI Synergy DES 3.5 x 20 (3.9 mm)   FASCIOTOMY Left 04/30/2019   Procedure: FASCIOTOMY AND  ARTERY REPAIR;  Surgeon: Milly Jakob, MD;  Location: [place];  Service: Orthopedics;  Laterality: Left;   KNEE SURGERY     LEFT HEART CATH AND CORS/GRAFTS ANGIOGRAPHY N/A 04/02/2018   Procedure: LEFT HEART CATH AND CORS/GRAFTS ANGIOGRAPHY;  Surgeon: Lorretta Harp, MD;  Location: [place] [facility];  Service: Cardiovascular: Ostial Left Main 50%; distal Left Main-pLAD 100% - d(apical)LAD 95 (after patent LIMA-LAD).OstRI - 99%; ost-mLCx 95%, dCx 95% & OM2 95%; ost-pRCA (co-dominant) 99%mRCA 80%; SVG-LPDA 99% ostial thrombosis; EF 30% with a EDP 31 mmHg   LEFT HEART CATH AND CORS/GRAFTS ANGIOGRAPHY N/A 04/30/2019   Procedure: LEFT HEART CATH AND CORS/GRAFTS ANGIOGRAPHY;  Surgeon: Leonie Man, MD;  Location: [place] [facility];  Service: Cardiovascular;  Laterality: N/A;   MULTIPLE TOOTH EXTRACTIONS     MUSCLE REPAIR     SECONDARY CLOSURE OF WOUND Left 05/07/2019   Procedure: DELAYED PRIMARY CLOSURE LEFT FOREARM WOUND;  Surgeon: Milly Jakob, MD;  Location: [place];  Service: Orthopedics;  Laterality: Left;   TONSILLECTOMY         Family History  Problem Relation Age of Onset   CAD Mother        CABG 25's, died 82's    Social History   Tobacco Use   Smoking status: Every Day    Packs/day: 1.00    Types: Cigarettes   Smokeless tobacco: Never  Vaping Use   Vaping Use: Never used  Substance Use Topics   Alcohol use: No   Drug use: Yes    Types: Marijuana    Comment: pt states he smoke in am and in pm for pain, last smoked 05-26-21     Home Medications Prior to Admission medications   Medication Sig Start Date End Date Taking? Authorizing Provider  aspirin 81 MG EC tablet Take 1 tablet (81 mg total) by mouth at bedtime. 03/26/20   Lorretta Harp, MD  atorvastatin (LIPITOR) 80 MG tablet Take 1 tablet (80 mg total) by mouth daily at 6 PM. 03/26/20   Lorretta Harp, MD  ezetimibe (ZETIA) 10 MG tablet Take 1 tablet (10 mg total) by mouth daily. 05/28/21 08/26/21  Lorretta Harp, MD  JARDIANCE 10 MG TABS tablet TAKE ONE (1) TABLET BY MOUTH EVERY DAY 12/04/20   Lorretta Harp, MD  Lancets [facility] ULTRASOFT) lancets Use as instructed 07/05/16   Donzetta Starch, NP  metFORMIN (GLUCOPHAGE) 500 MG tablet TAKE 1 TABLET(500 MG) BY MOUTH TWICE DAILY WITH A MEAL 03/26/20   Lorretta Harp, MD  metoprolol tartrate (LOPRESSOR) 25 MG tablet TAKE 1/2 TABLET BY MOUTH TWICE DAILY 04/21/21   Lorretta Harp, MD  nitroGLYCERIN (NITROSTAT) 0.4 MG SL tablet Place 1 tablet (0.4 mg total) under the tongue every 5 (five) minutes as needed. 07/02/21   [name], MD  valsartan (DIOVAN) 40 MG tablet TAKE ONE (1) TABLET BY MOUTH EVERY DAY 06/30/21   [name], MD    Allergies    Penicillins, Codeine, and Nsaids  Review of Systems   Review of Systems  Unable to perform ROS: Acuity of condition  Cardiovascular:  Positive for chest pain.  Neurological:  Positive for dizziness.   Physical Exam Updated Vital Signs BP (!) 149/72   Pulse 76   Temp (!) 97.3 F (36.3 C) (Oral)   Resp 16   SpO2 96%   Physical Exam Vitals and nursing note reviewed.  Constitutional:      General: He is not in acute distress.    Appearance: Normal appearance. He is well-developed and normal weight. He is ill-appearing.     Comments: He is uncooperative, he states he feels "sick and dizzy" and needs to lie down  HENT:     Head: Normocephalic and atraumatic.     Mouth/Throat:     Pharynx: No oropharyngeal exudate.  Eyes:      Conjunctiva/sclera: Conjunctivae normal.     Pupils: Pupils are equal, round, and reactive to light.  Neck:     Comments: No meningismus. Cardiovascular:     Rate and Rhythm: Normal rate and regular rhythm.     Heart sounds: Normal heart sounds. No murmur heard. Pulmonary:     Effort: Pulmonary effort is normal. No respiratory distress.     Breath sounds: Normal breath sounds.     Comments: Barrel chest Chest:     Chest wall: No tenderness.  Abdominal:     Palpations: Abdomen is soft.     Tenderness: There is no abdominal tenderness. There is no guarding or rebound.  Musculoskeletal:        General: No tenderness. Normal range of motion.     Cervical back: Normal range of motion and neck supple.  Skin:    General: Skin is warm.  Neurological:     Mental Status: He is alert and oriented to person, place, and time.     Cranial Nerves: No cranial nerve deficit.     Motor: No abnormal muscle tone.     Coordination: Coordination normal.     Comments: Holding eyes closed.  Resists majority of exam.  He has no facial droop and is able to protrude his tongue within the midline. Uncooperative for finger-to-nose testing stating he is "too weak". Equal grip strength in upper extremities. States he has no strength in his legs and cannot lift them off the bed. No obvious nystagmus.  Psychiatric:        Behavior: Behavior normal.    ED Results / Procedures / Treatments   Labs (all labs ordered are listed, but only abnormal results are displayed) Labs Reviewed  CBC - Abnormal; Notable for the following components:      Result Value   Hemoglobin 17.3 (*)    All other components within normal limits  COMPREHENSIVE METABOLIC PANEL - Abnormal; Notable for the following components:   Potassium 3.3 (*)    Chloride 97 (*)    Glucose, Bld 282 (*)    Total Bilirubin 1.4 (*)    Anion gap 16 (*)    All other components within normal limits  RAPID URINE DRUG SCREEN, HOSP PERFORMED - Abnormal;  Notable for the following  components:   Tetrahydrocannabinol POSITIVE (*)    All other components within normal limits  URINALYSIS, ROUTINE W REFLEX MICROSCOPIC - Abnormal; Notable for the following components:   Glucose, UA >=500 (*)    Ketones, ur 20 (*)    All other components within normal limits  CBG MONITORING, ED - Abnormal; Notable for the following components:   Glucose-Capillary 275 (*)    All other components within normal limits  RESP PANEL BY RT-PCR (FLU A&B, COVID) [id number]  ETHANOL  PROTIME-INR  APTT  DIFFERENTIAL  I-STAT CHEM 8, ED  I-STAT BETA HCG BLOOD, ED ([facility], [facility], AP ONLY)  TROPONIN I (HIGH SENSITIVITY)  TROPONIN I (HIGH SENSITIVITY)    EKG None  Radiology CT HEAD CODE STROKE WO CONTRAST  Result Date: 10/13/2021 CLINICAL DATA:  Code stroke.  Dizziness, chest pain, nausea EXAM: CT HEAD WITHOUT CONTRAST TECHNIQUE: Contiguous axial images were obtained from the base of the skull through the vertex without intravenous contrast. COMPARISON:  02/08/2020 FINDINGS: Brain: No evidence of acute infarction, hemorrhage, cerebral edema, mass, mass effect, or midline shift. Ventricles and sulci are normal for age. No extra-axial fluid collection. Previously noted left para falcine subdural hematoma is no longer seen. Redemonstrated remote lacunar infarcts in the bilateral basal ganglia. Vascular: No hyperdense vessel. Atherosclerotic calcifications in the intracranial carotid and vertebral arteries. Skull: Normal. Negative for fracture or focal lesion. Sinuses/Orbits: No acute finding. Other: The mastoid air cells are well aerated. ASPECTS [facility] Stroke Program Early CT Score) - Ganglionic level infarction (caudate, lentiform nuclei, internal capsule, insula, M1-M3 cortex): 7 - Supraganglionic infarction (M4-M6 cortex): 3 Total score (0-10 with 10 being normal): 10 IMPRESSION: 1. No acute intracranial process. 2. ASPECTS is 10 Code stroke imaging results were communicated on  10/13/2021 at 2:40 am to provider Dr. Lorrin Goodell via secure text paging. Electronically Signed   By: Merilyn Baba M.D.   On: 10/13/2021 02:40    Procedures Procedures   Medications Ordered in ED Medications  iohexol (OMNIPAQUE) 350 MG/ML injection 60 mL (60 mLs Intravenous Contrast Given 10/13/21 0247)    ED Course  I have reviewed the triage vital signs and the nursing notes.  Pertinent labs & imaging results that were available during my care of the patient were reviewed by me and considered in my medical decision making (see chart for details).    MDM Rules/Calculators/A&P                          Persistent nausea, vomiting, and vertigo since about 2 PM.  Dizziness is worse when he opens his eyes and changes positions.  He is mostly not cooperative for exam.  Will not participate in finger-to-nose testing.  He is not a tPA candidate due to delay in presentation.  He has however within the window for thrombolysis and CTA imaging will be pursued, code stroke activated.  He is given meclizine and Zofran.  Labs are reassuring with unchanged EKG and negative troponin. CTA shows no large vessel occlusion and no basilar or vertebral stenosis or thrombosis.   Discussed with neurology Dr. Lorrin Goodell.  He is seen patient.  Agrees with pursuing MRI given patient's unreliable exam  Patient sleeping on multiple reassessments.  He states he is still too dizzy to walk or ambulate.  He is given additional doses of medications  MRI is pending.  Patient not candidate for tPA given delayed presentation.  CTA is negative for large vessel occlusion.  Per neurology  plan, MRI to be obtained to rule out central cause of vertigo. Ambulatory challenge pending.  Dr. Ashok Cordia to assume care at shift change pending MRI and further recommendations from neurology.   Final Clinical Impression(s) / ED Diagnoses Final diagnoses:  None    Rx / DC Orders ED Discharge Orders     None        Lisa Milian,  Annie Main, MD 10/13/21 0740

## 2021-10-13 NOTE — Consult Note (Signed)
NEUROLOGY CONSULTATION NOTE   Date of service: October 13, 2021 Patient Name: Matthew Moses MRN:  [id number] DOB:  Nov 17, 1951 Reason for consult: "Vertigo with nausea and vomiting" Requesting Provider: Glynn Octave, MD _ _ _   _ __   _ __ _ _  __ __   _ __   __ _  History of Present Illness  Matthew Moses is a 70 y.o. male with PMH significant for CAD status post CABG, hyperlipidemia, diabetes type 2, hypertension, prior MI, prior history of strokes who presents with sudden onset nausea with vomiting and vertigo.  Patient reports that he was at home watching TV at 1400 on 10/12/2021 when he had sudden onset dizziness which she describes as spinning sensation along with intense feeling of nausea and he did throw up a couple times.  Reports that the vertigo is better when he closes his eyes and potentially may be completely goes away but then recurs as soon as he opens his eyes.  He does not think that the vertigo is positional.  His symptoms have been persistent since they started and so he eventually came into the ED for further evaluation and work-up.  In the ED waiting room, he was evaluated by the triage team and a code stroke was activated for concern for potential posterior circulation stroke.  CT head and CT angio head and neck were completed which were negative for a large territory stroke.  MR S: 0 TNKase: He is outside the window for TNKase at the time of presentation. Thrombectomy: No LVO was noted and specifically no basilar thrombus was noted in the posterior circulation and the thrombectomy was not offered. LK W: 1411/21/2022.  NIHSS components Score: Comment  1a Level of Conscious 0[x]  1[]  2[]  3[]      1b LOC Questions 0[x]  1[]  2[]       1c LOC Commands 0[x]  1[]  2[]       2 Best Gaze 0[x]  1[]  2[]       3 Visual 0[x]  1[]  2[]  3[]      4 Facial Palsy 0[x]  1[]  2[]  3[]      5a Motor Arm - left 0[x]  1[]  2[]  3[]  4[]  UN[]    5b Motor Arm - Right 0[x]  1[]  2[]  3[]  4[]  UN[]    6a Motor  Leg - Left 0[x]  1[]  2[]  3[]  4[]  UN[]    6b Motor Leg - Right 0[x]  1[]  2[]  3[]  4[]  UN[]    7 Limb Ataxia 0[x]  1[]  2[]  3[]  UN[]     8 Sensory 0[x]  1[]  2[]  UN[]      9 Best Language 0[x]  1[]  2[]  3[]      10 Dysarthria 0[x]  1[]  2[]  UN[]      11 Extinct. and Inattention 0[x]  1[]  2[]       TOTAL: 0     ROS   Constitutional Denies weight loss, fever and chills.   HEENT Denies changes in vision and hearing.   Respiratory Denies SOB and cough.   CV Denies palpitations and CP   GI Denies abdominal pain, but endorses nausea and vomiting with no diarrhea.  GU Denies dysuria and urinary frequency.   MSK Denies myalgia and joint pain.   Skin Denies rash and pruritus.   Neurological Denies headache and syncope.  Psychiatric Denies recent changes in mood. Denies anxiety and depression.    Past History   Past Medical History:  Diagnosis Date  . CAD, multiple vessel    Severe native CAD involving left main, LAD, RCA, ramus intermedius and PDA -> status post CABG x2 (LIMA-LAD, SVG-L  PDA)  . Coronary artery disease involving coronary bypass graft of native heart with angina pectoris ([place]) - thrombotic 95% oclusion of SVG-LPDA 04/03/2018   occluded LAD, subtotally occluded nondominant to codominant RCA, LCx & RI.  LIMA-LAD was intact. CULPRIT LESION was the SVG-dLPLA that had a high-grade proximal thrombotic stenosis.  S/P successful PCI of SVG-LPDA stented his vein graft with a 3.5 mm drug-eluting stent using distal protection.  [name] Kitchen Headache     " from nitro"   . Hx of CABG   . Hyperlipidemia associated with type 2 diabetes mellitus ([place])   . Hypertension   . Myocardial infarction ([place])   . Open wound of left forearm   . Stroke ([place])   . Tobacco abuse   . Type II diabetes mellitus with complication ([facility])    History of stroke and CAD-CABG  . Wears dentures    Past Surgical History:  Procedure Laterality Date  . CORONARY ARTERY BYPASS GRAFT    . CORONARY STENT INTERVENTION N/A 04/30/2019   Procedure:  CORONARY STENT INTERVENTION;  Surgeon: Leonie Man, MD;  Location: [facility];  Service: Cardiovascular;  Laterality: N/A;  . CORONARY/GRAFT ACUTE MI REVASCULARIZATION N/A 04/02/2018   Procedure: Coronary/Graft Acute MI Revascularization;  Surgeon: Lorretta Harp, MD;  Location: [facility];  Service: Cardiovascular: Thrombotic 95% SVG-L [id number] - thrombectomy -> DES PCI Synergy DES 3.5 x 20 (3.9 mm)  . FASCIOTOMY Left 04/30/2019   Procedure: FASCIOTOMY AND  ARTERY REPAIR;  Surgeon: Milly Jakob, MD;  Location: [facility];  Service: Orthopedics;  Laterality: Left;  . KNEE SURGERY    . LEFT HEART CATH AND CORS/GRAFTS ANGIOGRAPHY N/A 04/02/2018   Procedure: LEFT HEART CATH AND CORS/GRAFTS ANGIOGRAPHY;  Surgeon: Lorretta Harp, MD;  Location: [facility];  Service: Cardiovascular: Ostial Left Main 50%; distal Left Main-pLAD 100% - d(apical)LAD 95 (after patent LIMA-LAD).OstRI - 99%; ost-mLCx 95%, dCx 95% & OM2 95%; ost-pRCA (co-dominant) 99%mRCA 80%; SVG-LPDA 99% ostial thrombosis; EF 30% with a EDP 31 mmHg  . LEFT HEART CATH AND CORS/GRAFTS ANGIOGRAPHY N/A 04/30/2019   Procedure: LEFT HEART CATH AND CORS/GRAFTS ANGIOGRAPHY;  Surgeon: Leonie Man, MD;  Location: [facility];  Service: Cardiovascular;  Laterality: N/A;  . MULTIPLE TOOTH EXTRACTIONS    . MUSCLE REPAIR    . SECONDARY CLOSURE OF WOUND Left 05/07/2019   Procedure: DELAYED PRIMARY CLOSURE LEFT FOREARM WOUND;  Surgeon: Milly Jakob, MD;  Location: [facility];  Service: Orthopedics;  Laterality: Left;  . TONSILLECTOMY     Family History  Problem Relation Age of Onset  . CAD Mother        CABG 34's, died 58's   Social History   Socioeconomic History  . Marital status: Single    Spouse name: Not on file  . Number of children: Not on file  . Years of education: Not on file  . Highest education level: Not on file  Occupational History  . Not on file  Tobacco Use  . Smoking status: Every Day     Packs/day: 1.00    Types: Cigarettes  . Smokeless tobacco: Never  Vaping Use  . Vaping Use: Never used  Substance and Sexual Activity  . Alcohol use: No  . Drug use: Yes    Types: Marijuana    Comment: pt states he smoke in am and in pm for pain, last smoked 05-26-21  . Sexual activity: Not on file  Other Topics Concern  .  Not on file  Social History Narrative  . Not on file   Social Determinants of Health   Financial Resource Strain: Not on file  Food Insecurity: Not on file  Transportation Needs: Not on file  Physical Activity: Not on file  Stress: Not on file  Social Connections: Not on file   Allergies  Allergen Reactions  . Penicillins Itching and Rash    Has patient had a PCN reaction causing immediate rash, facial/tongue/throat swelling, SOB or lightheadedness with hypotension: Yes Has patient had a PCN reaction causing severe rash involving mucus membranes or skin necrosis: No Has patient had a PCN reaction that required hospitalization: No Has patient had a PCN reaction occurring within the last 10 years: No If all of the above answers are "NO", then may proceed with Cephalosporin use.  . Codeine Hives  . Nsaids Other (See Comments)    Per the patient, he said one of his MD(s) stated he was to NOT take NSAIDS    Medications  (Not in a hospital admission)    Vitals   Vitals:   10/13/21 0158 10/13/21 0308  BP: (!) 149/72 129/79  Pulse: 76 (!) 56  Resp: 16 16  Temp: (!) 97.3 F (36.3 C)   TempSrc: Oral   SpO2: 96% 97%     There is no height or weight on file to calculate BMI.  Physical Exam   General: Laying comfortably in bed; in no acute distress.  HENT: Normal oropharynx and mucosa. Normal external appearance of ears and nose.  Neck: Supple, no pain or tenderness  CV: No JVD. No peripheral edema.  Pulmonary: Symmetric Chest rise. Normal respiratory effort.  Abdomen: Soft to touch, non-tender.  Ext: No cyanosis, edema, or deformity  Skin: No  rash. Normal palpation of skin.   Musculoskeletal: Normal digits and nails by inspection. No clubbing.   Neurologic Examination  Mental status/Cognition: Alert, oriented to self, place, month and year, good attention.  Speech/language: Fluent, comprehension intact, object naming intact, repetition intact.  Cranial nerves:   CN II Pupils equal and reactive to light, no VF deficits    CN III,IV,VI EOM intact, no gaze preference or deviation, no nystagmus    CN V normal sensation in V1, V2, and V3 segments bilaterally    CN VII no asymmetry, no nasolabial fold flattening    CN VIII normal hearing to speech    CN IX & X normal palatal elevation, no uvular deviation    CN XI 5/5 head turn and 5/5 shoulder shrug bilaterally    CN XII midline tongue protrusion    Motor:  Muscle bulk: normal, tone normal, pronator drift none tremor none Mvmt Root Nerve  Muscle Right Left Comments  SA C5/6 Ax Deltoid     EF C5/6 Mc Biceps 5 5   EE C6/7/8 Rad Triceps 5 5   WF C6/7 Med FCR     WE C7/8 PIN ECU     F Ab C8/T1 U ADM/FDI 5 5   HF L1/2/3 Fem Illopsoas 5 5   KE L2/3/4 Fem Quad 5 5   DF L4/5 D Peron Tib Ant 5 5   PF S1/2 Tibial Grc/Sol 5 5    Reflexes:  Right Left Comments  Pectoralis      Biceps (C5/6) 2 2   Brachioradialis (C5/6) 2 2    Triceps (C6/7) 2 2    Patellar (L3/4) 2 2    Achilles (S1)      Hoffman  Plantar     Jaw jerk    Sensation:  Light touch Intact throughout   Pin prick    Temperature    Vibration   Proprioception    Coordination/Complex Motor:  - Finger to Nose intact bilaterally - Heel to shin unable to get up to participate into this. - Rapid alternating movement unable to get him to participate. - Gait: refused to walk.  Labs   CBC:  Recent Labs  Lab 10/13/21 0231  WBC 7.6  NEUTROABS 5.3  HGB 17.3*  HCT 48.8  MCV 84.7  PLT [id number]    Basic Metabolic Panel:  Lab Results  Component Value Date   NA 135 10/13/2021   K 3.3 (L) 10/13/2021   CO2  22 10/13/2021   GLUCOSE 282 (H) 10/13/2021   BUN 10 10/13/2021   CREATININE 0.70 10/13/2021   CALCIUM 9.3 10/13/2021   GFRNONAA >60 10/13/2021   GFRAA >60 02/07/2020   Lipid Panel:  Lab Results  Component Value Date   LDLCALC 100 (H) 05/20/2021   HgbA1c:  Lab Results  Component Value Date   HGBA1C 10.0 (H) 05/24/2019   Urine Drug Screen:     Component Value Date/Time   LABOPIA NONE DETECTED 07/02/2016 1646   COCAINSCRNUR NONE DETECTED 07/02/2016 1646   LABBENZ NONE DETECTED 07/02/2016 1646   AMPHETMU NONE DETECTED 07/02/2016 1646   THCU POSITIVE (A) 07/02/2016 1646   LABBARB NONE DETECTED 07/02/2016 1646    Alcohol Level     Component Value Date/Time   ETH <10 10/13/2021 0231    CT Head without contrast(Personally reviewed): CTH was negative for a large hypodensity concerning for a large territory infarct or hyperdensity concerning for an ICH  CT angio Head and Neck with contrast(Personally reviewed): No LVO, no basilar thrombosis.  MRI Brain: pending Impression   Matthew Moses is a 70 y.o. male with several stroke risk factors who presents with sudden onset vertigo with nausea and vomiting.  Difficult to get him to cooperate and unable to keep his eyes open for the HINTS+ exam. The clinical features of his persistent sudden onset vertigo seem more concerning for a central etiology of his vertigo.  Recommendations  - MRI Brain without contrast. - Meclizine and Zofran PRN for Nausea control. - further workup pending above. ______________________________________________________________________  Plan discussed with Dr. Wyvonnia Dusky.  Thank you for the opportunity to take part in the care of this patient. If you have any further questions, please contact the neurology consultation attending.  Signed,  [place] Pager Number [phone] _ _ _   _ __   _ __ _ _  __ __   _ __   __ _

## 2021-10-13 NOTE — H&P (Signed)
Date: 10/13/2021               Patient Name:  Matthew Moses MRN: [id number]  DOB: 1951/09/05 Age / Sex: 70 y.o., male   PCP: Patient, No Pcp Per (Inactive)         Medical Service: Internal Medicine Teaching Service         Attending Physician: Dr. Earl Lagos, MD    First Contact: Ilene Qua, MD Pager: AD [phone]  Second Contact: Sharrell Ku, MD Pager: PA [phone]       After Hours (After 5p/  First Contact Pager: [phone]  weekends / holidays): Second Contact Pager: [phone]   SUBJECTIVE  Chief Complaint: Dizziness  History of Present Illness: Matthew Moses is a 70 y.o. male with a pertinent PMH of HTN, T2DM, Tobacco use disorder, Ischemic cardiomyopathy s/p CABG, HLD, Hx of CVA  who presents to [facility] with acute onset vertigo with gait instability.  Patient was in his normal state of health until yesterday when he developed acute onset dizziness which he describes as the room spinning around him.  He also was having difficulty with walking secondary to gait instability.  He had associated presyncopal symptoms, nausea and emesis that was worsened/triggered by movement.  He states that he previously had a stroke in the past but denies ever having symptoms such as this.  He denies any current chest pain, shortness of breath, palpitations, substance use, changes in medications., weakness, change in sensation. He states that he gets all of his medications from his cardiologist, Dr. Gery Pray.  But he does not have a primary care physician.  He denies any other inciting events leading up to the onset of his symptoms.  He denies any recent travel or sick contacts. His major risk factors for CVA include diabetes, hypertension, previous history of ASCVD including stroke and CAD status post CABG, obesity/sedentary life.  Medications: No current facility-administered medications on file prior to encounter.   Current Outpatient Medications on File Prior to Encounter  Medication Sig Dispense  Refill   aspirin 81 MG EC tablet Take 1 tablet (81 mg total) by mouth at bedtime.     atorvastatin (LIPITOR) 80 MG tablet Take 1 tablet (80 mg total) by mouth daily at 6 PM. 90 tablet 1   JARDIANCE 10 MG TABS tablet TAKE ONE (1) TABLET BY MOUTH EVERY DAY (Patient taking differently: Take 10 mg by mouth daily.) 90 tablet 1   metFORMIN (GLUCOPHAGE) 500 MG tablet TAKE 1 TABLET(500 MG) BY MOUTH TWICE DAILY WITH A MEAL (Patient taking differently: Take 500 mg by mouth 2 (two) times daily with a meal.) 180 tablet 0   metoprolol tartrate (LOPRESSOR) 25 MG tablet TAKE 1/2 TABLET BY MOUTH TWICE DAILY (Patient taking differently: Take 12.5 mg by mouth 2 (two) times daily.) 120 tablet 0   nitroGLYCERIN (NITROSTAT) 0.4 MG SL tablet Place 1 tablet (0.4 mg total) under the tongue every 5 (five) minutes as needed. 25 tablet 11   valsartan (DIOVAN) 40 MG tablet TAKE ONE (1) TABLET BY MOUTH EVERY DAY (Patient taking differently: Take 40 mg by mouth daily.) 30 tablet 0   ezetimibe (ZETIA) 10 MG tablet Take 1 tablet (10 mg total) by mouth daily. (Patient not taking: Reported on 10/13/2021) 90 tablet 3   Lancets (ONETOUCH ULTRASOFT) lancets Use as instructed 100 each 12    Past Medical History: Past Medical History:  Diagnosis Date   CAD, multiple vessel    Severe native CAD involving left main,  LAD, RCA, ramus intermedius and PDA -> status post CABG x2 (LIMA-LAD, SVG-L PDA)   Coronary artery disease involving coronary bypass graft of native heart with angina pectoris ([place]) - thrombotic 95% oclusion of SVG-LPDA 04/03/2018   occluded LAD, subtotally occluded nondominant to codominant RCA, LCx & RI.  LIMA-LAD was intact. CULPRIT LESION was the SVG-dLPLA that had a high-grade proximal thrombotic stenosis.  S/P successful PCI of SVG-LPDA stented his vein graft with a 3.5 mm drug-eluting stent using distal protection.   Headache     " from nitro"    Hx of CABG    Hyperlipidemia associated with type 2 diabetes mellitus  (HCC)    Hypertension    Myocardial infarction ([place])    Open wound of left forearm    Stroke ([place])    Tobacco abuse    Type II diabetes mellitus with complication (HCC)    History of stroke and CAD-CABG   Wears dentures     Social:  Lives -  Occupation - disability Support - girl friend Level of function - some limited functionality of his left leg due to a recent fracture.  PCP - none Substance use - tobacco use disorder  Family History: Family History  Problem Relation Age of Onset   CAD Mother        CABG 61's, died 26's    Allergies: Allergies as of 10/13/2021 - Review Complete 10/13/2021  Allergen Reaction Noted   Penicillins Itching and Rash 04/21/2015   Codeine Hives 04/21/2015   Nsaids Other (See Comments) 02/07/2020    Review of Systems: A complete ROS was negative except as per HPI.   OBJECTIVE:  Physical Exam: Blood pressure 131/81, pulse 75, temperature (!) 97.3 F (36.3 C), temperature source Oral, resp. rate (!) 23, SpO2 99 %. Physical Exam Constitutional:      Appearance: He is obese.  HENT:     Head: Normocephalic and atraumatic.  Eyes:     General: No visual field deficit.    Extraocular Movements: Extraocular movements intact.     Pupils: Pupils are equal, round, and reactive to light.  Cardiovascular:     Rate and Rhythm: Normal rate and regular rhythm.     Pulses: Normal pulses.     Heart sounds: Normal heart sounds.  Pulmonary:     Effort: Pulmonary effort is normal.     Breath sounds: Normal breath sounds.  Abdominal:     General: There is no distension.     Tenderness: There is no abdominal tenderness.  Musculoskeletal:        General: Swelling (bilateral 1-2+ swelling in his bilateral lower extremities) present.     Cervical back: Normal range of motion. No rigidity.  Skin:    General: Skin is warm and dry.  Neurological:     Mental Status: He is alert and oriented to person, place, and time.     Cranial Nerves: No  dysarthria or facial asymmetry.     Sensory: Sensation is intact.     Motor: No weakness.     Coordination: Coordination is intact.     Comments: Patient has truncal ataxia with significant vertigo symptoms.     Pertinent Labs: CBC    Component Value Date/Time   WBC 7.6 10/13/2021 0231   RBC 5.76 10/13/2021 0231   HGB 17.3 (H) 10/13/2021 0231   HCT 48.8 10/13/2021 0231   PLT 218 10/13/2021 0231   MCV 84.7 10/13/2021 0231   MCH 30.0 10/13/2021 0231  MCHC 35.5 10/13/2021 0231   RDW 12.8 10/13/2021 0231   LYMPHSABS 1.5 10/13/2021 0231   MONOABS 0.7 10/13/2021 0231   EOSABS 0.0 10/13/2021 0231   BASOSABS 0.1 10/13/2021 0231     CMP     Component Value Date/Time   NA 135 10/13/2021 0231   NA 135 05/24/2019 0850   K 3.3 (L) 10/13/2021 0231   CL 97 (L) 10/13/2021 0231   CO2 22 10/13/2021 0231   GLUCOSE 282 (H) 10/13/2021 0231   BUN 10 10/13/2021 0231   BUN 18 05/24/2019 0850   CREATININE 0.70 10/13/2021 0231   CALCIUM 9.3 10/13/2021 0231   PROT 7.3 10/13/2021 0231   PROT 6.8 05/20/2021 1029   ALBUMIN 3.7 10/13/2021 0231   ALBUMIN 3.9 05/20/2021 1029   AST 17 10/13/2021 0231   ALT 15 10/13/2021 0231   ALKPHOS 89 10/13/2021 0231   BILITOT 1.4 (H) 10/13/2021 0231   BILITOT 0.6 05/20/2021 1029   GFRNONAA >60 10/13/2021 0231   GFRAA >60 02/07/2020 1816    Pertinent Imaging: MR BRAIN WO CONTRAST  Result Date: 10/13/2021 CLINICAL DATA:  Vertigo, central. EXAM: MRI HEAD WITHOUT CONTRAST TECHNIQUE: Multiplanar, multiecho pulse sequences of the brain and surrounding structures were obtained without intravenous contrast. COMPARISON:  CT October 13, 2021. FINDINGS: Brain: Small foci of restricted diffusion scattered throughout the cerebellar hemispheres and vermis, consistent with acute infarcts. There is also a focus of restricted diffusion involving posterior aspect of the posterior limb of left internal capsule and caudate body, also consistent with acute/subacute infarct.No  hemorrhage hydrocephalus, extra-axial collection or mass lesion. Remote lacunar infarcts in the right lentiform nucleus, anterior aspect of the posterior limb of the left internal capsule and inferior aspect of left right frontal lobe. T2 hyperintense in the left cerebral peduncle extending into the pons is likely related to wallerian degeneration. Vascular: Normal flow voids. Skull and upper cervical spine: Normal marrow signal. Sinuses/Orbits: Negative. Other: None. IMPRESSION: 1. Multiple small acute/subacute infarcts in the cerebellum bilaterally and posterior limb of the left internal capsule/caudate body. 2. Remote lacunar infarcts in the right lentiform nucleus, anterior aspect of the posterior limb of the left internal capsule and inferior aspect of left right frontal lobe. Electronically Signed   By: Baldemar LenisKatyucia  de Macedo Rodrigues M.D.   On: 10/13/2021 07:56   CT HEAD CODE STROKE WO CONTRAST  Result Date: 10/13/2021 CLINICAL DATA:  Code stroke.  Dizziness, chest pain, nausea EXAM: CT HEAD WITHOUT CONTRAST TECHNIQUE: Contiguous axial images were obtained from the base of the skull through the vertex without intravenous contrast. COMPARISON:  02/08/2020 FINDINGS: Brain: No evidence of acute infarction, hemorrhage, cerebral edema, mass, mass effect, or midline shift. Ventricles and sulci are normal for age. No extra-axial fluid collection. Previously noted left para falcine subdural hematoma is no longer seen. Redemonstrated remote lacunar infarcts in the bilateral basal ganglia. Vascular: No hyperdense vessel. Atherosclerotic calcifications in the intracranial carotid and vertebral arteries. Skull: Normal. Negative for fracture or focal lesion. Sinuses/Orbits: No acute finding. Other: The mastoid air cells are well aerated. ASPECTS [facility]([place] Stroke Program Early CT Score) - Ganglionic level infarction (caudate, lentiform nuclei, internal capsule, insula, M1-M3 cortex): 7 - Supraganglionic infarction (M4-M6  cortex): 3 Total score (0-10 with 10 being normal): 10 IMPRESSION: 1. No acute intracranial process. 2. ASPECTS is 10 Code stroke imaging results were communicated on 10/13/2021 at 2:40 am to provider Dr. Derry LoryKhaliqdina via secure text paging. Electronically Signed   By: Elaina PatteeAlison  Vasan M.D.  On: 10/13/2021 02:40   CT ANGIO HEAD NECK W WO CM (CODE STROKE)  Result Date: 10/13/2021 CLINICAL DATA:  Stroke suspected EXAM: CT ANGIOGRAPHY HEAD AND NECK TECHNIQUE: Multidetector CT imaging of the head and neck was performed using the standard protocol during bolus administration of intravenous contrast. Multiplanar CT image reconstructions and MIPs were obtained to evaluate the vascular anatomy. Carotid stenosis measurements (when applicable) are obtained utilizing NASCET criteria, using the distal internal carotid diameter as the denominator. CONTRAST:  8mL OMNIPAQUE IOHEXOL 350 MG/ML SOLN COMPARISON:  CT head 10/13/2021 and 02/08/2020, correlation is also made with MRI 07/03/2016 FINDINGS: CT HEAD FINDINGS For noncontrast findings, please see same day CT head. CTA NECK FINDINGS Aortic arch: Standard branching. Imaged portion shows no evidence of aneurysm or dissection. No significant stenosis of the major arch vessel origins. Right carotid system: No evidence of dissection, stenosis (50% or greater) or occlusion. Left carotid system: No evidence of dissection, stenosis (50% or greater) or occlusion. Vertebral arteries: Codominant. No evidence of dissection, stenosis (50% or greater) or occlusion. Skeleton: No acute osseous abnormality. Status post median sternotomy. Edentulous. Other neck: Small calcifications in the bilateral parotid glands. Otherwise negative. Upper chest: Negative. Review of the MIP images confirms the above findings CTA HEAD FINDINGS Anterior circulation: Both internal carotid arteries are patent to the termini, with moderate circumferential calcifications but without significant stenosis. A1  segments patent. Normal anterior communicating artery. Anterior cerebral arteries are patent to their distal aspects. No M1 stenosis or occlusion. Normal MCA bifurcations. Distal MCA branches diminutive but perfused and symmetric. Posterior circulation: Vertebral arteries patent to the vertebrobasilar junction without stenosis. Basilar patent to its distal aspect. Superior cerebellar arteries patent bilaterally. Near fetal origin of the left PCA, with a diminutive left P1. PCAs perfused to their distal aspects with mild multifocal narrowing. The left posterior communicating artery is visualized. The right posterior communicating artery is not visualized. Venous sinuses: As permitted by contrast timing, patent. Anatomic variants: None significant Review of the MIP images confirms the above findings IMPRESSION: 1. No hemodynamically significant stenosis in the neck. 2. No intracranial large vessel occlusion or significant stenosis. Electronically Signed   By: Merilyn Baba M.D.   On: 10/13/2021 03:04    EKG: personally reviewed my interpretation is normal sinus rhythm, left axis deviation  ASSESSMENT & PLAN:  Assessment: Principal Problem:   CVA (cerebral vascular accident) ([place])   Khalifah Macdougall is a 70 y.o. with pertinent PMH of HTN, T2DM, Tobacco use disorder, Ischemic cardiomyopathy s/p CABG, HLD, Hx of CVA who presented with dizziness and admit for posterior stroke on hospital day 0  Plan: #CVA Patient admitted with signs and symptoms of central dizziness with MRI findings consistent with multifocal areas of acute/subacute stroke.  Neurology has been consulted.  Patient has been started on aspirin and atorvastatin.  Further imaging pending an echocardiogram pending.  Although patient does have significant history of ASCVD including significant CAD putting patient at risk for thrombotic CVA, MRI findings concerning for embolic etiology.  I will keep patient on telemetry overnight to assess for possible  embolic source. -Lipid panel pending -A1c pending -Echocardiogram pending -CTA pending -Continue ASA 81 mg -Continue atorvastatin 80 mg -Zofran needed -Frequent neurochecks - Bedside stroke swallow eval - Permissive HTN - PT/OT consulted -Prescient neurology's assistance  #T2DM: Patient has a history of type 2 diabetes without history of long-term insulin use.  Patient's last hemoglobin A1c was 8.3 on 09/2021. - Hold Jardiance and metformin -We will start  CBG monitoring with SSI - Repeat A1c pending - Would recommend tighter control of blood sugar  #HFpEF #Ischemic cardiomyopathy status post CABG: #CAD: #HLD: Patient has a history of heart failure with preserved ejection fraction with a history of ischemic cardiomyopathy status post CABG.  He appears mildly hypervolemic on exam although does not take diuretics at home.  Otherwise, he denies any shortness of breath or chest pain. - Echo pending - Hold metoprolol   #HTN: - hold metoprolol and valsartan  #Tobacco use disorder  -Tobacco cessation counseling -TOC consulted for assistance with resources -Prescribed nicotine replacement patch/gum if needed   Best Practice: Diet: NPO until swallow screen IVF: Fluids: none, Rate: None VTE: enoxaparin (LOVENOX) injection 40 mg Start: 10/13/21 1115 Code: Full AB: none Status: Inpatient with expected length of stay greater than 2 midnights. Anticipated Discharge Location: SNF versus HHPT Barriers to Discharge: Medical stability  Signature: Chari Manning, D.O.  Internal Medicine Resident, PGY-3 [facility]  Pager: [phone] 11:13 AM, 10/13/2021   Please contact the on call pager after 5 pm and on weekends at [phone].

## 2021-10-13 NOTE — ED Triage Notes (Signed)
Patient arrived with EMS from home reports central chest pain this evening and multiple emesis , no SOB , denies chest pain at triage.

## 2021-10-13 NOTE — ED Notes (Signed)
Clabe Seal girlfriend [phone] requesting an update on the patient

## 2021-10-13 NOTE — Progress Notes (Addendum)
Inpatient Diabetes Program Recommendations  AACE/ADA: New Consensus Statement on Inpatient Glycemic Control (2015)  Target Ranges:  Prepandial:   less than 140 mg/dL      Peak postprandial:   less than 180 mg/dL (1-2 hours)      Critically ill patients:  140 - 180 mg/dL   Lab Results  Component Value Date   GLUCAP 275 (H) 10/13/2021   HGBA1C 8.3 (H) 10/13/2021    Review of Glycemic Control  Latest Reference Range & Units 10/13/21 02:24  Glucose-Capillary 70 - 99 mg/dL 749 (H)  (H): Data is abnormally high  Latest Reference Range & Units 10/13/21 02:31  Anion gap 5 - 15  16 (H)  (H): Data is abnormally high  Diabetes history: DM2 Outpatient Diabetes medications: Metformin 500 mg BID, Jardiance 10 QD Current orders for Inpatient glycemic control: None  Inpatient Diabetes Program Recommendations:    Please consider glycemic control order set using Novolog 0-9 units TID and 0-5.  If CBG's remain elevated please add Semglee 10 units QD (0.1 units/kg).    Will continue to follow while inpatient.  Thank you, Dulce Sellar, RN, BSN Diabetes Coordinator Inpatient Diabetes Program [phone] (team pager from 8a-5p)

## 2021-10-13 NOTE — ED Notes (Signed)
This RN responded to Pt's call bell. Pt was sitting up in bed. Pt requesting to get out of bed. This RN counseled pt, that this RN needed to verify pt activity status prior to helping pt get up out of bed. Pt requesting AMA form. MD Richardson Dopp paged. Primary RN made aware.

## 2021-10-13 NOTE — Hospital Course (Signed)
2 days dizziness vague unsteadiness, room spinning, difficulty walking with ataxia.

## 2021-10-13 NOTE — Code Documentation (Signed)
Stroke Response Nurse Documentation Code Documentation  Darrie Macmillan is a 70 y.o. male arriving to [facility] ED via [facility] on 11/22 with past medical hx of CVA, HTN, CAD with MI, HLD, DM2. On aspirin 81 mg daily. Code stroke was activated by ED.   Patient from home where he was LKW at 1400 and now complaining of nausea/vomiting and slurred speech.   Stroke team at the bedside on patient arrival. Labs drawn and patient cleared for CT by Dr. Manus Gunning. Patient to CT with team. NIHSS 0, see documentation for details and code stroke times. Patient with no deficits on exam. The following imaging was completed:  CT, CTA head and neck. MRI to follow. Patient is not a candidate for IV Thrombolytic due to no fixed deficit. Patient is not a candidate for IR due to No LVO.     Bedside handoff with ED RN Carmie Kanner.    Rose Fillers  Rapid Response RN

## 2021-10-13 NOTE — ED Notes (Signed)
Pt refuses to ambulate.  

## 2021-10-13 NOTE — ED Notes (Addendum)
Admit MD spoke with pt about leaving AMA . Pt still wanting to leave AMA. This RN informed pt he wouldn't walk out of hospital stable and asked pts to stay but pt refused. This RN offered pt wheelchair but pt refused and stated he just wanted to leave. Pt limped to ed entranceway with this RN by his side. Pt A&O x4 upon D/C

## 2021-10-13 NOTE — ED Provider Notes (Signed)
Signed out to check MRI results, if pos, admit to medicine.   MRI with subacute/acute posterior cva. Medicine consulted for admission.    Cathren Laine, MD 10/13/21 [phone]

## 2021-10-13 NOTE — Progress Notes (Signed)
STROKE TEAM PROGRESS NOTE   INTERVAL HISTORY No visitors at bedside Patient is drowsy. Arouses to verbal stimuli. Reports he developed dizziness, nausea and left sided weakness yesterday but thought it would pass. He denied chest pain, palpitation, fluttering or other chest symptom. He reports being a "bad patient" who takes his medications some days but not others. He admits he continues to smoke 3/4 to 1 and 1/4 packs of cigarettes per day despite multiple cardiac issues and advice to quit. He feels it is too hard to quit after 50 years of smoking. He recently fractured left tibia. We discussed his diagnosis, ongoing work up and plan of care. He appeared disinterested, not engaging in discussion for plan. Encouraged to treat this event as an opportunity to take better care of himself.   Lives with girlfriend.  MRI scan of the brain shows tiny bilateral cerebellar and left internal capsule infarcts.  CT angiogram shows no significant large vessel stenosis intracranially or extracranially.  Urine drug screen is positive for marijuana.  LDL cholesterol is 127 mg percent. Vitals:   10/13/21 1130 10/13/21 1145 10/13/21 1200 10/13/21 1215  BP: 117/79 140/69 103/73 128/74  Pulse: 60 60 66 (!) 59  Resp: 18 17 18 19   Temp:      TempSrc:      SpO2: 97% 97% 97% 96%   CBC:  Recent Labs  Lab 10/13/21 0231  WBC 7.6  NEUTROABS 5.3  HGB 17.3*  HCT 48.8  MCV 84.7  PLT 218   Basic Metabolic Panel:  Recent Labs  Lab 10/13/21 0231  NA 135  K 3.3*  CL 97*  CO2 22  GLUCOSE 282*  BUN 10  CREATININE 0.70  CALCIUM 9.3   Lipid Panel:  Recent Labs  Lab 10/13/21 0755  CHOL 185  TRIG 115  HDL 35*  CHOLHDL 5.3  VLDL 23  LDLCALC 10/15/21*   HgbA1c:  Recent Labs  Lab 10/13/21 0231  HGBA1C 8.3*   Urine Drug Screen:  Recent Labs  Lab 10/13/21 0328  LABOPIA NONE DETECTED  COCAINSCRNUR NONE DETECTED  LABBENZ NONE DETECTED  AMPHETMU NONE DETECTED  THCU POSITIVE*  LABBARB NONE DETECTED     Alcohol Level  Recent Labs  Lab 10/13/21 0231  ETH <10    IMAGING past 24 hours MR BRAIN WO CONTRAST  Result Date: 10/13/2021 CLINICAL DATA:  Vertigo, central. EXAM: MRI HEAD WITHOUT CONTRAST TECHNIQUE: Multiplanar, multiecho pulse sequences of the brain and surrounding structures were obtained without intravenous contrast. COMPARISON:  CT October 13, 2021. FINDINGS: Brain: Small foci of restricted diffusion scattered throughout the cerebellar hemispheres and vermis, consistent with acute infarcts. There is also a focus of restricted diffusion involving posterior aspect of the posterior limb of left internal capsule and caudate body, also consistent with acute/subacute infarct.No hemorrhage hydrocephalus, extra-axial collection or mass lesion. Remote lacunar infarcts in the right lentiform nucleus, anterior aspect of the posterior limb of the left internal capsule and inferior aspect of left right frontal lobe. T2 hyperintense in the left cerebral peduncle extending into the pons is likely related to wallerian degeneration. Vascular: Normal flow voids. Skull and upper cervical spine: Normal marrow signal. Sinuses/Orbits: Negative. Other: None. IMPRESSION: 1. Multiple small acute/subacute infarcts in the cerebellum bilaterally and posterior limb of the left internal capsule/caudate body. 2. Remote lacunar infarcts in the right lentiform nucleus, anterior aspect of the posterior limb of the left internal capsule and inferior aspect of left right frontal lobe. Electronically Signed   By: October 15, 2021  de Sindy Messing M.D.   On: 10/13/2021 07:56   CT HEAD CODE STROKE WO CONTRAST  Result Date: 10/13/2021 CLINICAL DATA:  Code stroke.  Dizziness, chest pain, nausea EXAM: CT HEAD WITHOUT CONTRAST TECHNIQUE: Contiguous axial images were obtained from the base of the skull through the vertex without intravenous contrast. COMPARISON:  02/08/2020 FINDINGS: Brain: No evidence of acute infarction, hemorrhage,  cerebral edema, mass, mass effect, or midline shift. Ventricles and sulci are normal for age. No extra-axial fluid collection. Previously noted left para falcine subdural hematoma is no longer seen. Redemonstrated remote lacunar infarcts in the bilateral basal ganglia. Vascular: No hyperdense vessel. Atherosclerotic calcifications in the intracranial carotid and vertebral arteries. Skull: Normal. Negative for fracture or focal lesion. Sinuses/Orbits: No acute finding. Other: The mastoid air cells are well aerated. ASPECTS [facility] Stroke Program Early CT Score) - Ganglionic level infarction (caudate, lentiform nuclei, internal capsule, insula, M1-M3 cortex): 7 - Supraganglionic infarction (M4-M6 cortex): 3 Total score (0-10 with 10 being normal): 10 IMPRESSION: 1. No acute intracranial process. 2. ASPECTS is 10 Code stroke imaging results were communicated on 10/13/2021 at 2:40 am to provider Dr. Lorrin Goodell via secure text paging. Electronically Signed   By: Merilyn Baba M.D.   On: 10/13/2021 02:40   CT ANGIO HEAD NECK W WO CM (CODE STROKE)  Result Date: 10/13/2021 CLINICAL DATA:  Stroke suspected EXAM: CT ANGIOGRAPHY HEAD AND NECK TECHNIQUE: Multidetector CT imaging of the head and neck was performed using the standard protocol during bolus administration of intravenous contrast. Multiplanar CT image reconstructions and MIPs were obtained to evaluate the vascular anatomy. Carotid stenosis measurements (when applicable) are obtained utilizing NASCET criteria, using the distal internal carotid diameter as the denominator. CONTRAST:  21mL OMNIPAQUE IOHEXOL 350 MG/ML SOLN COMPARISON:  CT head 10/13/2021 and 02/08/2020, correlation is also made with MRI 07/03/2016 FINDINGS: CT HEAD FINDINGS For noncontrast findings, please see same day CT head. CTA NECK FINDINGS Aortic arch: Standard branching. Imaged portion shows no evidence of aneurysm or dissection. No significant stenosis of the major arch vessel origins.  Right carotid system: No evidence of dissection, stenosis (50% or greater) or occlusion. Left carotid system: No evidence of dissection, stenosis (50% or greater) or occlusion. Vertebral arteries: Codominant. No evidence of dissection, stenosis (50% or greater) or occlusion. Skeleton: No acute osseous abnormality. Status post median sternotomy. Edentulous. Other neck: Small calcifications in the bilateral parotid glands. Otherwise negative. Upper chest: Negative. Review of the MIP images confirms the above findings CTA HEAD FINDINGS Anterior circulation: Both internal carotid arteries are patent to the termini, with moderate circumferential calcifications but without significant stenosis. A1 segments patent. Normal anterior communicating artery. Anterior cerebral arteries are patent to their distal aspects. No M1 stenosis or occlusion. Normal MCA bifurcations. Distal MCA branches diminutive but perfused and symmetric. Posterior circulation: Vertebral arteries patent to the vertebrobasilar junction without stenosis. Basilar patent to its distal aspect. Superior cerebellar arteries patent bilaterally. Near fetal origin of the left PCA, with a diminutive left P1. PCAs perfused to their distal aspects with mild multifocal narrowing. The left posterior communicating artery is visualized. The right posterior communicating artery is not visualized. Venous sinuses: As permitted by contrast timing, patent. Anatomic variants: None significant Review of the MIP images confirms the above findings IMPRESSION: 1. No hemodynamically significant stenosis in the neck. 2. No intracranial large vessel occlusion or significant stenosis. Electronically Signed   By: Merilyn Baba M.D.   On: 10/13/2021 03:04    PHYSICAL EXAM Pleasant middle-aged  Caucasian male not in distress. . Afebrile. Head is nontraumatic. Neck is supple without bruit.    Cardiac exam no murmur or gallop. Lungs are clear to auscultation. Distal pulses are well  felt.  Neurological Exam ;  Awake  Alert oriented x 3. Normal speech and language.eye movements full without nystagmus.fundi were not visualized. Vision acuity and fields appear normal. Hearing is normal. Palatal movements are normal. Face symmetric. Tongue midline. Normal strength, tone, reflexes and coordination. Normal sensation. Gait deferred.  ASSESSMENT/PLAN Matthew Moses is a 70 y.o. male with several stroke risk factors who presents with sudden onset vertigo with nausea and vomiting.  Difficult to get him to cooperate and unable to keep his eyes open for the HINTS+ exam. The clinical features of his persistent sudden onset vertigo seem more concerning for a central etiology of his vertigo.  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  Left against medical advice before stroke work up completed.   Code Stroke CT head No acute abnormality.  CTA head & neck  No hemodynamically significant stenosis in the neck. No intracranial large vessel occlusion or significant stenosis.  MR Brain Multiple small acute/subacute infarcts in the cerebellum bilaterally and posterior limb of the left internal capsule/caudate body. Remote lacunar infarcts in the right lentiform nucleus, anterior aspect of the posterior limb of the left internal capsule and inferior aspect of left right frontal lobe. 2D Echo not obtained TEE: not obtained  LDL 127 HgbA1c 8.3 VTE prophylaxis - per primary team     Diet   Diet NPO time specified   On ASA 81mg  PTA but admits to noncompliance.  Recommend aspirin 81 and Plavix 75 mg daily for 3 weeks followed by aspirin alone Disposition:  Left AMA prior to work up completion  Hypertension Stable Permissive hypertension (OK if < 220/120) but gradually normalize in 5-7 days Long-term BP goal normotensive  Hyperlipidemia Home meds:  Zetia  10mg , Lipitor 80 mg high intensity dose  LDL 127, goal < 70 Continue statin at discharge       Diabetes type II Uncontrolled Home meds:  Jardiance, metformin HgbA1c 8.3, goal < 7.0 CBGs Recent Labs    10/13/21 0224  GLUCAP 275*    Management per primary team       Noncompliance Self reports noncompliance with medications and medical advice Does not follow with PCP  Other Stroke Risk Factors Advanced Age >/= 58  Current Cigarette smoker, advised to stop smoking, declines to consider Substance abuse - UDS:  THC POSITIVE, Cocaine NONE DETECTED. Patient advised to stop using due to stroke risk. ?Obesity, no weight obtained as yet, There is no height or weight on file to calculate BMI., BMI >/= 30 associated with increased stroke risk, recommend weight loss, diet and exercise as appropriate  Coronary artery disease s/p CABG and stents  Congestive heart failure  Other Active Problems   Hospital day # 1  Delila A Bailey-Modzik, NP-C  STROKE MD NOTE :  I have personally obtained history,examined this patient, reviewed notes, independently viewed imaging studies, participated in medical decision making and plan of care.ROS completed by me personally and pertinent positives fully documented  I have made any additions or clarifications directly to the above note. Agree with note above.  Presented with sudden onset of vertigo and dizziness with MRI scan showing small bilateral cerebellar and left internal capsule lacunar infarcts.  Recommend continuing ongoing stroke  work-up and dual antiplatelet therapy for 3 weeks followed by aspirin alone and aggressive risk factor modification.  Patient counseled to quit smoking cigarettes but states that it is difficult for him to do so for 50 years.  He was also counseled to quit marijuana.  Greater than 50% time during this 35-minute visit was spent in counseling and coordination of care about his strokes discussion about stroke prevention and treatment  and answering questions.  Antony Contras, MD Medical Director [facility] Pager: [phone] 10/13/2021 4:17 PM   To contact Stroke Continuity provider, please refer to [email or web address] After hours, contact General Neurology

## 2021-10-21 ENCOUNTER — Ambulatory Visit: Payer: Medicare Other | Admitting: Cardiovascular Disease

## 2021-10-27 ENCOUNTER — Ambulatory Visit (INDEPENDENT_AMBULATORY_CARE_PROVIDER_SITE_OTHER): Payer: Medicare Other | Admitting: Cardiovascular Disease

## 2021-10-27 ENCOUNTER — Other Ambulatory Visit: Payer: Self-pay

## 2021-10-27 ENCOUNTER — Encounter: Payer: Self-pay | Admitting: Cardiovascular Disease

## 2021-10-27 DIAGNOSIS — F17210 Nicotine dependence, cigarettes, uncomplicated: Secondary | ICD-10-CM | POA: Diagnosis not present

## 2021-10-27 DIAGNOSIS — I10 Essential (primary) hypertension: Secondary | ICD-10-CM

## 2021-10-27 DIAGNOSIS — E7800 Pure hypercholesterolemia, unspecified: Secondary | ICD-10-CM

## 2021-10-27 DIAGNOSIS — I25709 Atherosclerosis of coronary artery bypass graft(s), unspecified, with unspecified angina pectoris: Secondary | ICD-10-CM | POA: Diagnosis not present

## 2021-10-27 DIAGNOSIS — Z8673 Personal history of transient ischemic attack (TIA), and cerebral infarction without residual deficits: Secondary | ICD-10-CM

## 2021-10-27 NOTE — Assessment & Plan Note (Signed)
History of CAD status post coronary artery bypass grafting in [place] in 2012.  I performed cardiac catheterization on him after he had a VT VF arrest 04/02/2018.  I stented his obtuse marginal vein graft and postdilated to 3.9 mm.  His LIMA was intact.  His right was diffusely diseased and unbypassed.  He returned a year later 04/30/2019 in the setting of a non-STEMI and would underwent cardiac catheterization by Dr. Herbie Baltimore revealing diffuse "in-stent restenosis which was addressed with Cut[place] Balloon angioplasty with excellent result.  Patient denies chest pain or shortness of breath.  His major complaint is of "fatigue".

## 2021-10-27 NOTE — Assessment & Plan Note (Signed)
History of essential hypertension a blood pressure measured today at 128/64.  He is on metoprolol and valsartan.

## 2021-10-27 NOTE — Assessment & Plan Note (Signed)
History of hyperlipidemia on high-dose statin therapy with lipid profile performed 10/13/2021 revealing total cholesterol of 185, LDL 127 and HDL 35, not at goal for secondary prevention.  He is supposed to be on Zetia which he does not take.  I am referring him to Dr. Rennis Golden for further evaluation management of his hyperlipidemia.  His LDL goal should be less than 70.

## 2021-10-27 NOTE — Assessment & Plan Note (Signed)
History of recent CVA approximately week ago.  He saw Dr. Pearlean Brownie for this.  He had strokes in multiple vascular territories.  He had no large or small vessel disease on brain imaging.  He did not have a 2D echo or TEE however.  I am going arrange for him to have a transesophageal echo and a 30-day event monitor.

## 2021-10-27 NOTE — Assessment & Plan Note (Signed)
History of ongoing tobacco abuse of 1 pack/day recalcitrant risk factor modification. 

## 2021-10-27 NOTE — Patient Instructions (Addendum)
Medication Instructions:  Your physician recommends that you continue on your current medications as directed. Please refer to the Current Medication list given to you today.  *If you need a refill on your cardiac medications before your next appointment, please call your pharmacy*   Lab Work: Your physician recommends that you return for lab work in: with 7 days of TEE: BMET & CBC.  If you have labs (blood work) drawn today and your tests are completely normal, you will receive your results only by: Section (if you have MyChart) OR A paper copy in the mail If you have any lab test that is abnormal or we need to change your treatment, we will call you to review the results.   Testing/Procedures: See below  [employer] Cardiac Event Monitor Instructions Your physician has requested you wear your cardiac event monitor for 30 days. [employer] may call or text to confirm a shipping address. The monitor will be sent to a land address via UPS. [employer] will not ship a monitor to a PO BOX. It typically takes 3-5 days to receive your monitor after it has been enrolled. [employer] will assist with USPS tracking if your package is delayed. The telephone number for [employer] is [phone]. Once you have received your monitor, please review the enclosed instructions. Instruction tutorials can also be viewed under help and settings on the enclosed cell phone. Your monitor has already been registered assigning a specific monitor serial # to you.  Applying the monitor Remove cell phone from case and turn it on. The cell phone works as Dealer and needs to be within [employer] of you at all times. The cell phone will need to be charged on a daily basis. We recommend you plug the cell phone into the enclosed charger at your bedside table every night.  Monitor batteries: You will receive two monitor batteries labelled #1 and #2. These are your recorders. Plug battery #2 onto the  second connection on the enclosed charger. Keep one battery on the charger at all times. This will keep the monitor battery deactivated. It will also keep it fully charged for when you need to switch your monitor batteries. A small light will be blinking on the battery emblem when it is charging. The light on the battery emblem will remain on when the battery is fully charged.  Open package of a Monitor strip. Insert battery #1 into black hood on strip and gently squeeze monitor battery onto connection as indicated in instruction booklet. Set aside while preparing skin.  Choose location for your strip, vertical or horizontal, as indicated in the instruction booklet. Shave to remove all hair from location. There cannot be any lotions, oils, powders, or colognes on skin where monitor is to be applied. Wipe skin clean with enclosed Saline wipe. Dry skin completely.  Peel paper labeled #1 off the back of the Monitor strip exposing the adhesive. Place the monitor on the chest in the vertical or horizontal position shown in the instruction booklet. One arrow on the monitor strip must be pointing upward. Carefully remove paper labeled #2, attaching remainder of strip to your skin. Try not to create any folds or wrinkles in the strip as you apply it.  Firmly press and release the circle in the center of the monitor battery. You will hear a small beep. This is turning the monitor battery on. The heart emblem on the monitor battery will light up every 5 seconds if the monitor battery in turned on  and connected to the patient securely. Do not push and hold the circle down as this turns the monitor battery off. The cell phone will locate the monitor battery. A screen will appear on the cell phone checking the connection of your monitor strip. This may read poor connection initially but change to good connection within the next minute. Once your monitor accepts the connection you will hear a series of  3 beeps followed by a climbing crescendo of beeps. A screen will appear on the cell phone showing the two monitor strip placement options. Touch the picture that demonstrates where you applied the monitor strip.  Your monitor strip and battery are waterproof. You are able to shower, bathe, or swim with the monitor on. They just ask you do not submerge deeper than 3 feet underwater. We recommend removing the monitor if you are swimming in a lake, river, or ocean.  Your monitor battery will need to be switched to a fully charged monitor battery approximately once a week. The cell phone will alert you of an action which needs to be made.  On the cell phone, tap for details to reveal connection status, monitor battery status, and cell phone battery status. The green dots indicates your monitor is in good status. A red dot indicates there is something that needs your attention.  To record a symptom, click the circle on the monitor battery. In 30-60 seconds a list of symptoms will appear on the cell phone. Select your symptom and tap save. Your monitor will record a sustained or significant arrhythmia regardless of you clicking the button. Some patients do not feel the heart rhythm irregularities. [employer] will notify us of any serious or critical events.  Refer to instruction booklet for instructions on switching batteries, changing strips, the Do not disturb or Pause features, or any additional questions.  Call [employer] at [phone], to confirm your monitor is transmitting and record your baseline. They will answer any questions you may have regarding the monitor instructions at that time.  Returning the monitor to [employer] Place all equipment back into blue box. Peel off strip of paper to expose adhesive and close box securely. There is a prepaid UPS shipping label on this box. Drop in a UPS drop box, or at a UPS facility like [employer]. You may also contact [employer] to arrange  UPS to pick up monitor package at your home.   Follow-Up: At [facility], you and your health needs are our priority.  As part of our continuing mission to provide you with exceptional heart care, we have created designated Provider Care Teams.  These Care Teams include your primary Cardiologist (physician) and Advanced Practice Providers (APPs -  Physician Assistants and Nurse Practitioners) who all work together to provide you with the care you need, when you need it.  We recommend signing up for the patient portal called "MyChart".  Sign up information is provided on this After Visit Summary.  MyChart is used to connect with patients for Virtual Visits (Telemedicine).  Patients are able to view lab/test results, encounter notes, upcoming appointments, etc.  Non-urgent messages can be sent to your provider as well.   To learn more about what you can do with MyChart, go to [email or web address].    Your next appointment:   Mar 26, 2022 at 10:30am  The format for your next appointment:   In Person  Provider:   Edd Fabian, FNP   Then, Nanetta Batty, MD will plan to see you again in  12 month(s).  Other Instructions   You are scheduled for a TEE on December 20th with Dr. Gardiner Rhyme.  Please arrive at the [facility] (Main Entrance A) at [facility]: [address] [place], [place] [address] at 8:30 am. (1 hour prior to procedure unless lab work is needed; if lab work is needed arrive 1.5 hours ahead)  DIET: Nothing to eat or drink after midnight except a sip of water with medications (see medication instructions below)  FYI: For your safety, and to allow [place] to monitor your vital signs accurately during the surgery/procedure we request that   if you have artificial nails, gel coating, SNS etc. Please have those removed prior to your surgery/procedure. Not having the nail coverings /polish removed may result in cancellation or delay of your surgery/procedure.  Labs: BMET and  CBC- to be done within 7 days of procedure.  You must have a responsible person to drive you home and stay in the waiting area during your procedure. Failure to do so could result in cancellation.  Bring your insurance cards.  *Special Note: Every effort is made to have your procedure done on time. Occasionally there are emergencies that occur at the hospital that may cause delays. Please be patient if a delay does occur.
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
## 2021-11-02 ENCOUNTER — Encounter (HOSPITAL_COMMUNITY): Payer: Self-pay | Admitting: Cardiology

## 2021-11-02 NOTE — Progress Notes (Signed)
Attempted to obtain medical history via telephone, unable to reach at this time. Unable to leave voicemail to return pre surgical testing department's phone call.   ?

## 2021-11-05 ENCOUNTER — Ambulatory Visit (INDEPENDENT_AMBULATORY_CARE_PROVIDER_SITE_OTHER): Payer: Medicare Other

## 2021-11-05 ENCOUNTER — Telehealth: Payer: Self-pay | Admitting: Cardiovascular Disease

## 2021-11-05 DIAGNOSIS — I480 Paroxysmal atrial fibrillation: Secondary | ICD-10-CM | POA: Diagnosis not present

## 2021-11-05 DIAGNOSIS — Z8673 Personal history of transient ischemic attack (TIA), and cerebral infarction without residual deficits: Secondary | ICD-10-CM

## 2021-11-05 NOTE — Telephone Encounter (Signed)
Spoke with pt regarding questions about [employer] cardiac monitor. All questions answered and pt verbalizes understanding. Pt is comfortable wearing monitor for 30 days a prescribed.

## 2021-11-05 NOTE — Telephone Encounter (Signed)
Patient states Dr. Allyson Sabal recommended a heart monitor for him. He says he read the instructions and it stated to press the button when he has symptoms and what kind of symptoms. He would like to know if the monitor actually monitors his heart the whole time and if not he does not want to wear it.

## 2021-11-06 LAB — BASIC METABOLIC PANEL
BUN/Creatinine Ratio: 17 (ref 10–24)
BUN: 11 mg/dL (ref 8–27)
CO2: 24 mmol/L (ref 20–29)
Calcium: 9.3 mg/dL (ref 8.6–10.2)
Chloride: 96 mmol/L (ref 96–106)
Creatinine, Ser: 0.66 mg/dL — ABNORMAL LOW (ref 0.76–1.27)
Glucose: 284 mg/dL — ABNORMAL HIGH (ref 70–99)
Potassium: 4.1 mmol/L (ref 3.5–5.2)
Sodium: 135 mmol/L (ref 134–144)
eGFR: 102 mL/min/{1.73_m2} (ref >59)

## 2021-11-06 LAB — CBC
Hematocrit: 48.6 % (ref 37.5–51.0)
Hemoglobin: 16.4 g/dL (ref 13.0–17.7)
MCH: 29.4 pg (ref 26.6–33.0)
MCHC: 33.7 g/dL (ref 31.5–35.7)
MCV: 87 fL (ref 79–97)
Platelets: 187 10*3/uL (ref 150–450)
RBC: 5.58 x10E6/uL (ref 4.14–5.80)
RDW: 12.8 % (ref 11.6–15.4)
WBC: 3.8 10*3/uL (ref 3.4–10.8)

## 2021-11-10 ENCOUNTER — Ambulatory Visit (HOSPITAL_COMMUNITY): Admission: RE | Admit: 2021-11-10 | Payer: Medicare Other | Source: Home / Self Care | Admitting: Cardiology

## 2021-11-10 ENCOUNTER — Encounter (HOSPITAL_COMMUNITY): Admission: RE | Payer: Self-pay | Source: Home / Self Care

## 2021-11-10 ENCOUNTER — Ambulatory Visit (HOSPITAL_COMMUNITY): Payer: Medicare Other

## 2021-11-10 SURGERY — ECHOCARDIOGRAM, TRANSESOPHAGEAL
Anesthesia: Monitor Anesthesia Care

## 2021-11-17 ENCOUNTER — Telehealth: Payer: Self-pay | Admitting: Cardiovascular Disease

## 2021-11-17 NOTE — Telephone Encounter (Signed)
Patient said the phone that sends the remote transmissions for his monitor does not work. He tried to call the company directly but was not given a call back.   The patient is not sure what to do. Please advise

## 2021-11-26 ENCOUNTER — Telehealth: Payer: Self-pay | Admitting: *Deleted

## 2021-11-26 NOTE — Telephone Encounter (Signed)
Patient did not answer phone earlier in day. No voicemail available.  I contacted [employer] to see if Phone situation was rectified. Patient had not recorded since 11/09/21.  [employer] unable to contact patient. Received permission from Evelena Asa at [place] to apply a new monitor to patient with a 2 week extension on his cardiac event monitor. I was able to contact patient later to schedule him to come in Monday , 11/30/21 at 9:00 AM to have replacement applied.  We will need to call or Email Mitch at [phone] to give him serial # of monitor to associate with patients enrollment and confirm EOS date with 2 week extension. Should be 12/18/2021 since his current EOS date is 12/04/21.

## 2021-12-09 ENCOUNTER — Other Ambulatory Visit: Payer: Self-pay | Admitting: Cardiovascular Disease

## 2021-12-09 DIAGNOSIS — Z8673 Personal history of transient ischemic attack (TIA), and cerebral infarction without residual deficits: Secondary | ICD-10-CM

## 2021-12-09 DIAGNOSIS — I480 Paroxysmal atrial fibrillation: Secondary | ICD-10-CM

## 2021-12-09 NOTE — Telephone Encounter (Signed)
Patient was a no show for his 11/30/21 appointment to have his replacement monitor applied for granted extension. I will import what data is available from his first monitor for Dr. Allyson SabalBerry to review.

## 2021-12-18 ENCOUNTER — Emergency Department (HOSPITAL_BASED_OUTPATIENT_CLINIC_OR_DEPARTMENT_OTHER): Payer: Medicare Other

## 2021-12-18 ENCOUNTER — Emergency Department (HOSPITAL_BASED_OUTPATIENT_CLINIC_OR_DEPARTMENT_OTHER)
Admission: EM | Admit: 2021-12-18 | Discharge: 2021-12-18 | Disposition: A | Discharge status: Home / Self Care | Payer: Medicare Other | Attending: Emergency Medicine | Admitting: Emergency Medicine

## 2021-12-18 ENCOUNTER — Other Ambulatory Visit: Payer: Self-pay

## 2021-12-18 ENCOUNTER — Encounter (HOSPITAL_BASED_OUTPATIENT_CLINIC_OR_DEPARTMENT_OTHER): Payer: Self-pay

## 2021-12-18 DIAGNOSIS — Z7984 Long term (current) use of oral hypoglycemic drugs: Secondary | ICD-10-CM | POA: Diagnosis not present

## 2021-12-18 DIAGNOSIS — S61241A Puncture wound with foreign body of left index finger without damage to nail, initial encounter: Secondary | ICD-10-CM | POA: Diagnosis present

## 2021-12-18 DIAGNOSIS — Z7982 Long term (current) use of aspirin: Secondary | ICD-10-CM | POA: Diagnosis not present

## 2021-12-18 DIAGNOSIS — S60451A Superficial foreign body of left index finger, initial encounter: Secondary | ICD-10-CM

## 2021-12-18 DIAGNOSIS — W34018A Accidental discharge of other gas, air or spring-operated gun, initial encounter: Secondary | ICD-10-CM | POA: Diagnosis not present

## 2021-12-18 MED ORDER — LIDOCAINE HCL (PF) 1 % IJ SOLN
10.0000 mL | Freq: Once | INTRAMUSCULAR | Status: AC
Start: 2021-12-18 — End: 2021-12-18
  Administered 2021-12-18: 10 mL
  Filled 2021-12-18: qty 10

## 2021-12-18 MED ORDER — TRAMADOL HCL 50 MG PO TABS: 50.0000 mg | ORAL_TABLET | Freq: Three times a day (TID) | ORAL | 0 refills | Status: DC | PRN

## 2021-12-18 MED ORDER — CEPHALEXIN 500 MG PO CAPS: 500.0000 mg | ORAL_CAPSULE | Freq: Four times a day (QID) | ORAL | 0 refills | Status: AC
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
## 2021-12-18 NOTE — Discharge Instructions (Addendum)
Take antibiotics as prescribed.  Take entire course, even if symptoms improve. Keep the dressing on for the next 24 hours.  After this, remove, wash with soap and water, reapply new dressing until wound heals. Use ice to help with pain and swelling.  Keep your finger elevated to help with throbbing and swelling. Take tramadol as needed for severe breakthrough pain.  Have caution, this may make you tired or groggy.  Do not drive while taking this medicine. I recommend you follow-up with your primary care doctor for recheck of the wound in about a week.  They may want to do repeat x-rays as the x-ray today could not definitively say whether or not there is a broken bone.  Regardless, this will heal on its own and there is nothing further to do. Return to the emergency room if you develop fever, thick white pus draining from the area, severe worsening pain, or any new, worsening, or concerning symptoms
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
## 2022-03-26 ENCOUNTER — Ambulatory Visit: Payer: Medicare Other | Admitting: General Practice

## 2022-04-05 ENCOUNTER — Ambulatory Visit: Payer: Medicare Other | Admitting: Internal Medicine

## 2022-05-17 ENCOUNTER — Other Ambulatory Visit: Payer: Self-pay | Admitting: Cardiovascular Disease

## 2022-07-09 ENCOUNTER — Telehealth: Payer: Self-pay | Admitting: Cardiovascular Disease

## 2022-07-09 NOTE — Telephone Encounter (Signed)
New Message:  Patient have been in [facility]. He was put on these medicine  while there in the hospital. Primary doctor would not write these prescriptions. Please see if Dr Allyson Sabal will please.        *STAT* If patient is at the pharmacy, call can be transferred to refill team.   1. Which medications need to be refilled? (please list name of each medication and dose if known) new prescriptions for Famotidine and Sucralfate  2. Which pharmacy/location (including street and city if local pharmacy) is medication to be sent to? [facility], - [phone]  3. Do they need a 30 day or 90 day supply? 90 days and refills

## 2022-07-15 NOTE — Telephone Encounter (Signed)
Left message for Matthew Moses to call back.

## 2022-08-04 ENCOUNTER — Other Ambulatory Visit: Payer: Self-pay | Admitting: Cardiovascular Disease

## 2022-08-14 ENCOUNTER — Other Ambulatory Visit: Payer: Self-pay | Admitting: Cardiovascular Disease
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
## 2022-08-23 ENCOUNTER — Ambulatory Visit: Payer: Medicare Other | Admitting: Physician Assistant

## 2022-10-27 ENCOUNTER — Ambulatory Visit: Payer: Medicare Other | Attending: Cardiovascular Disease | Admitting: Cardiovascular Disease

## 2022-11-01 ENCOUNTER — Encounter: Payer: Self-pay | Admitting: Cardiovascular Disease

## 2022-11-22 DEATH — deceased
